# Patient Record
Sex: Female | Born: 1950 | Race: White
Health system: Southern US, Community
[De-identification: ages and names within clinical notes are randomized; demographics above are authoritative.]

## PROBLEM LIST (undated history)

## (undated) VITALS — BP 156/72 | HR 67 | Temp 98.2°F | Resp 16 | Ht 64.0 in | Wt 148.0 lb

## (undated) VITALS — BP 137/68 | HR 78 | Temp 98.0°F | Resp 16 | Wt 147.0 lb

## (undated) VITALS — BP 154/88 | HR 86 | Temp 97.0°F | Wt 150.0 lb

## (undated) VITALS — BP 137/72 | HR 75 | Temp 97.7°F | Resp 18

## (undated) DIAGNOSIS — J392 Other diseases of pharynx: Secondary | ICD-10-CM

## (undated) DIAGNOSIS — N183 Chronic kidney disease, stage 3 unspecified: Secondary | ICD-10-CM

## (undated) DIAGNOSIS — E232 Diabetes insipidus: Secondary | ICD-10-CM

## (undated) DIAGNOSIS — E119 Type 2 diabetes mellitus without complications: Secondary | ICD-10-CM

## (undated) DIAGNOSIS — K219 Gastro-esophageal reflux disease without esophagitis: Secondary | ICD-10-CM

## (undated) DIAGNOSIS — N289 Disorder of kidney and ureter, unspecified: Secondary | ICD-10-CM

## (undated) DIAGNOSIS — S92909A Unspecified fracture of unspecified foot, initial encounter for closed fracture: Secondary | ICD-10-CM

## (undated) DIAGNOSIS — K8681 Exocrine pancreatic insufficiency: Secondary | ICD-10-CM

## (undated) DIAGNOSIS — D689 Coagulation defect, unspecified: Secondary | ICD-10-CM

## (undated) DIAGNOSIS — F419 Anxiety disorder, unspecified: Secondary | ICD-10-CM

## (undated) DIAGNOSIS — H269 Unspecified cataract: Secondary | ICD-10-CM

## (undated) DIAGNOSIS — N189 Chronic kidney disease, unspecified: Secondary | ICD-10-CM

## (undated) DIAGNOSIS — T148XXA Other injury of unspecified body region, initial encounter: Secondary | ICD-10-CM

## (undated) DIAGNOSIS — K589 Irritable bowel syndrome without diarrhea: Secondary | ICD-10-CM

## (undated) DIAGNOSIS — I10 Essential (primary) hypertension: Secondary | ICD-10-CM

## (undated) DIAGNOSIS — H539 Unspecified visual disturbance: Secondary | ICD-10-CM

## (undated) DIAGNOSIS — S199XXA Unspecified injury of neck, initial encounter: Secondary | ICD-10-CM

## (undated) DIAGNOSIS — K8689 Other specified diseases of pancreas: Secondary | ICD-10-CM

## (undated) DIAGNOSIS — S93409A Sprain of unspecified ligament of unspecified ankle, initial encounter: Secondary | ICD-10-CM

## (undated) DIAGNOSIS — M719 Bursopathy, unspecified: Secondary | ICD-10-CM

## (undated) DIAGNOSIS — S6290XA Unspecified fracture of unspecified wrist and hand, initial encounter for closed fracture: Secondary | ICD-10-CM

## (undated) DIAGNOSIS — L409 Psoriasis, unspecified: Secondary | ICD-10-CM

## (undated) DIAGNOSIS — I071 Rheumatic tricuspid insufficiency: Secondary | ICD-10-CM

## (undated) DIAGNOSIS — S9306XA Dislocation of unspecified ankle joint, initial encounter: Secondary | ICD-10-CM

## (undated) DIAGNOSIS — E785 Hyperlipidemia, unspecified: Secondary | ICD-10-CM

## (undated) DIAGNOSIS — M545 Low back pain, unspecified: Secondary | ICD-10-CM

## (undated) DIAGNOSIS — I519 Heart disease, unspecified: Secondary | ICD-10-CM

## (undated) DIAGNOSIS — I499 Cardiac arrhythmia, unspecified: Secondary | ICD-10-CM

## (undated) DIAGNOSIS — S42309A Unspecified fracture of shaft of humerus, unspecified arm, initial encounter for closed fracture: Secondary | ICD-10-CM

## (undated) DIAGNOSIS — G4730 Sleep apnea, unspecified: Secondary | ICD-10-CM

## (undated) DIAGNOSIS — J349 Unspecified disorder of nose and nasal sinuses: Secondary | ICD-10-CM

## (undated) DIAGNOSIS — T7840XA Allergy, unspecified, initial encounter: Secondary | ICD-10-CM

## (undated) DIAGNOSIS — E079 Disorder of thyroid, unspecified: Secondary | ICD-10-CM

## (undated) DIAGNOSIS — G4700 Insomnia, unspecified: Secondary | ICD-10-CM

## (undated) DIAGNOSIS — I639 Cerebral infarction, unspecified: Secondary | ICD-10-CM

## (undated) DIAGNOSIS — M810 Age-related osteoporosis without current pathological fracture: Secondary | ICD-10-CM

## (undated) HISTORY — DX: Insomnia, unspecified: G47.00

## (undated) HISTORY — DX: Allergy, unspecified, initial encounter: T78.40XA

## (undated) HISTORY — DX: Disorder of thyroid, unspecified: E07.9

## (undated) HISTORY — PX: FACIAL FRACTURE SURGERY: SHX1570

## (undated) HISTORY — PX: TONSILLECTOMY: SUR1361

## (undated) HISTORY — DX: Anxiety disorder, unspecified: F41.9

## (undated) HISTORY — PX: ABDOMINAL HYSTERECTOMY: SHX81

## (undated) HISTORY — DX: Gastro-esophageal reflux disease without esophagitis: K21.9

## (undated) HISTORY — PX: CERVICAL SPINE SURGERY: SHX589

## (undated) HISTORY — DX: Age-related osteoporosis without current pathological fracture: M81.0

## (undated) HISTORY — DX: Cerebral infarction, unspecified: I63.9

## (undated) HISTORY — DX: Hyperlipidemia, unspecified: E78.5

## (undated) HISTORY — DX: Essential (primary) hypertension: I10

## (undated) HISTORY — DX: Unspecified fracture of unspecified foot, initial encounter for closed fracture: S92.909A

## (undated) HISTORY — DX: Heart disease, unspecified: I51.9

## (undated) HISTORY — DX: Other diseases of pharynx: J39.2

## (undated) HISTORY — DX: Low back pain, unspecified: M54.50

## (undated) HISTORY — PX: SKIN BIOPSY: SHX1

## (undated) HISTORY — DX: Unspecified injury of neck, initial encounter: S19.9XXA

## (undated) HISTORY — DX: Other injury of unspecified body region, initial encounter: T14.8XXA

## (undated) HISTORY — PX: ABDOMINAL SURGERY: SHX537

## (undated) HISTORY — DX: Sleep apnea, unspecified: G47.30

## (undated) HISTORY — DX: Dislocation of unspecified ankle joint, initial encounter: S93.06XA

## (undated) HISTORY — DX: Unspecified visual disturbance: H53.9

## (undated) HISTORY — DX: Disorder of kidney and ureter, unspecified: N28.9

## (undated) HISTORY — DX: Unspecified fracture of shaft of humerus, unspecified arm, initial encounter for closed fracture: S42.309A

## (undated) HISTORY — DX: Unspecified fracture of unspecified hand, initial encounter for closed fracture: S62.90XA

## (undated) HISTORY — PX: CYSTOSCOPY, BLADDER BIOPSY: SHX3554

## (undated) HISTORY — PX: EYE SURGERY: SHX253

## (undated) HISTORY — DX: Bursopathy, unspecified: M71.9

## (undated) HISTORY — PX: CATARACT EXTRACTION: SUR2

## (undated) HISTORY — DX: Other specified diseases of pancreas: K86.89

## (undated) HISTORY — DX: Sprain of unspecified ligament of unspecified ankle, initial encounter: S93.409A

## (undated) HISTORY — PX: BLADDER SUSPENSION: SHX72

## (undated) HISTORY — DX: Unspecified disorder of nose and nasal sinuses: J34.9

## (undated) HISTORY — DX: Chronic kidney disease, unspecified: N18.9

## (undated) HISTORY — DX: Coagulation defect, unspecified: D68.9

## (undated) HISTORY — PX: FRACTURE SURGERY: SHX138

## (undated) HISTORY — DX: Diabetes insipidus: E23.2

## (undated) HISTORY — DX: Type 2 diabetes mellitus without complications: E11.9

## (undated) HISTORY — PX: HUMERUS FRACTURE SURGERY: SHX670

## (undated) HISTORY — DX: Rheumatic tricuspid insufficiency: I07.1

## (undated) HISTORY — DX: Psoriasis, unspecified: L40.9

## (undated) HISTORY — DX: Irritable bowel syndrome, unspecified: K58.9

---

## 1981-10-03 HISTORY — PX: ABDOMINAL SURGERY: SHX537

## 1991-10-04 DIAGNOSIS — I639 Cerebral infarction, unspecified: Secondary | ICD-10-CM

## 1991-10-04 HISTORY — DX: Cerebral infarction, unspecified: I63.9

## 2001-10-03 DIAGNOSIS — M300 Polyarteritis nodosa: Secondary | ICD-10-CM

## 2001-10-03 DIAGNOSIS — Z5189 Encounter for other specified aftercare: Secondary | ICD-10-CM

## 2001-10-03 HISTORY — DX: Polyarteritis nodosa: M30.0

## 2001-10-03 HISTORY — PX: RENAL BIOPSY: SHX156

## 2001-10-03 HISTORY — DX: Encounter for other specified aftercare: Z51.89

## 2002-05-03 ENCOUNTER — Emergency Department: Admit: 2002-05-03 | Payer: Self-pay | Source: Emergency Department | Admitting: Emergency Medicine

## 2002-05-27 ENCOUNTER — Inpatient Hospital Stay
Admission: EM | Admit: 2002-05-27 | Disposition: A | Discharge status: Home / Self Care | Payer: Self-pay | Source: Emergency Department | Admitting: Internal Medicine

## 2002-06-03 DIAGNOSIS — D699 Hemorrhagic condition, unspecified: Secondary | ICD-10-CM

## 2002-06-03 HISTORY — DX: Hemorrhagic condition, unspecified: D69.9

## 2002-06-12 ENCOUNTER — Ambulatory Visit
Admit: 2002-06-12 | Disposition: A | Discharge status: Home / Self Care | Payer: Self-pay | Source: Ambulatory Visit | Admitting: Internal Medicine

## 2005-03-31 DIAGNOSIS — M300 Polyarteritis nodosa: Secondary | ICD-10-CM | POA: Insufficient documentation

## 2005-03-31 DIAGNOSIS — N183 Chronic kidney disease, stage 3 unspecified: Secondary | ICD-10-CM | POA: Insufficient documentation

## 2006-07-27 DIAGNOSIS — E119 Type 2 diabetes mellitus without complications: Secondary | ICD-10-CM | POA: Insufficient documentation

## 2006-11-10 DIAGNOSIS — N952 Postmenopausal atrophic vaginitis: Secondary | ICD-10-CM | POA: Insufficient documentation

## 2006-12-06 DIAGNOSIS — K219 Gastro-esophageal reflux disease without esophagitis: Secondary | ICD-10-CM | POA: Insufficient documentation

## 2006-12-18 DIAGNOSIS — J309 Allergic rhinitis, unspecified: Secondary | ICD-10-CM | POA: Insufficient documentation

## 2007-01-26 DIAGNOSIS — H524 Presbyopia: Secondary | ICD-10-CM | POA: Insufficient documentation

## 2008-05-26 DIAGNOSIS — L570 Actinic keratosis: Secondary | ICD-10-CM | POA: Insufficient documentation

## 2010-05-12 ENCOUNTER — Ambulatory Visit
Admit: 2010-05-12 | Disposition: A | Discharge status: Home / Self Care | Payer: Self-pay | Source: Ambulatory Visit | Admitting: Internal Medicine

## 2010-10-03 HISTORY — PX: COLONOSCOPY: SHX174

## 2010-10-03 HISTORY — PX: LIFT, ARM (MEDICAL): SHX4702

## 2010-10-03 HISTORY — PX: EXCISION BIOPSY WITH NEEDLE LOCALIZATION: SHX2709

## 2011-06-30 DIAGNOSIS — D696 Thrombocytopenia, unspecified: Secondary | ICD-10-CM | POA: Insufficient documentation

## 2011-10-19 DIAGNOSIS — M7500 Adhesive capsulitis of unspecified shoulder: Secondary | ICD-10-CM | POA: Insufficient documentation

## 2011-12-08 DIAGNOSIS — R519 Headache, unspecified: Secondary | ICD-10-CM | POA: Insufficient documentation

## 2012-09-24 NOTE — Procedures (Unsigned)
PATIENT NAME:  Katelyn Pique MD:   Francesco Runner, MD      DICTATING MD:  Noelle Penner, MD      PATIENT DOB:     01-14-1951      MED REC NO:      [id number]      EXAM DATE:       05/29/2002      ACCOUNT NO:     [id number]      PATIENT LOC:     [id number] 855 [id number]      TAPE:  [phone]            EXAMINATION:  M-MODE, 2-D, DOPPLER AND COLOR FLOW DOPPLER ECHOCARDIOGRAM.            CLINICAL INDICATION:  A 61 year old female with pneumonia.  Please evaluate      ejection fraction.            INTERPRETATION:  M-Mode Dimensions:  RV 2.7 cm.  Aortic root 3 cm.  Aortic      leaflets 1.9 cm.  Left atrium 3.8 cm.  LV diastole 4.8 cm.  LV systole 3.2      cm.  Septal thickness 1 cm.  Posterior wall thickness 1 cm.  Fractional      shortening 33%.  Ejection fraction 62%.            2-D Comments:  All four chambers are normal size, although the right      ventricle is top normal size.  RV systolic function is normal.  LV systolic      function is normal.  No regional wall motion abnormality is noted.  No LV      thrombus identified.  The aortic valve is trileaflet.  It excurts normally.      No abnormalities of the mitral, tricuspid or pulmonic valve seen.  No      intracavitary mass detected.  No abnormalities of the pericardial space      seen.            Doppler reveals no evidence of aortic insufficiency.  Trace physiologic      mitral regurgitation noted.  Trace to mild tricuspid regurgitation.  Peak      velocity 2.11 m/s.  Assuming a right atrial pressure of 5-10 mmHg, RV      systolic pressure is estimated to be 23-28 mmHg which is normal.  No      pulmonic insufficiency.  Velocity across the pulmonic valve normal at 0.77      m/s.  The E:A ratio was reversed.            FINAL IMPRESSION/ASSESSMENT      1.   Normal LV size, wall thickness and systolic function.  Estimated      ejection fraction 62%.  No regional wall motion abnormality is noted.  No      LV thrombus identified.  Diastolic dysfunction was noted.       2.   Trace to mild tricuspid regurgitation with top normal RV size, normal      RV systolic function and normal estimated RV systolic pressure of 23-28      mmHg.                        __________________________________      Noelle Penner, MD            [id number]  D: 05/29/2002  7:17 P      T: 05/30/2002  3:36 P      J: [id number]      N: [id number]      CC:   Noelle Penner, MD
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
## 2013-10-03 HISTORY — PX: CHOLECYSTECTOMY: SHX55

## 2014-04-01 DIAGNOSIS — H95819 Postprocedural stenosis of unspecified external ear canal: Secondary | ICD-10-CM | POA: Insufficient documentation

## 2014-04-01 DIAGNOSIS — I669 Occlusion and stenosis of unspecified cerebral artery: Secondary | ICD-10-CM | POA: Insufficient documentation

## 2014-05-13 DIAGNOSIS — N009 Acute nephritic syndrome with unspecified morphologic changes: Secondary | ICD-10-CM | POA: Insufficient documentation

## 2015-08-06 DIAGNOSIS — G43009 Migraine without aura, not intractable, without status migrainosus: Secondary | ICD-10-CM | POA: Insufficient documentation

## 2015-08-06 DIAGNOSIS — G43909 Migraine, unspecified, not intractable, without status migrainosus: Secondary | ICD-10-CM | POA: Insufficient documentation

## 2016-02-15 ENCOUNTER — Ambulatory Visit (INDEPENDENT_AMBULATORY_CARE_PROVIDER_SITE_OTHER): Payer: Medicare Other | Admitting: Family Medicine

## 2016-02-15 ENCOUNTER — Encounter (INDEPENDENT_AMBULATORY_CARE_PROVIDER_SITE_OTHER): Payer: Self-pay

## 2016-02-15 VITALS — BP 149/88 | HR 90 | Temp 97.7°F | Resp 16 | Ht 64.0 in | Wt 150.0 lb

## 2016-02-15 DIAGNOSIS — H60502 Unspecified acute noninfective otitis externa, left ear: Secondary | ICD-10-CM

## 2016-02-15 MED ORDER — TRAMADOL HCL 50 MG PO TABS
ORAL_TABLET | ORAL | Status: DC
Start: 2016-02-15 — End: 2016-04-25

## 2016-02-15 MED ORDER — CIPROFLOXACIN-HYDROCORTISONE 0.2-1 % OT SUSP
OTIC | Status: DC
Start: 2016-02-15 — End: 2016-04-25

## 2016-02-15 NOTE — Progress Notes (Signed)
[facility]  PROGRESS NOTE     Patient: Katelyn Caldwell   Date: 02/15/2016   MRN: [id number]       Katelyn Caldwell is a 65 y.o. female      SUBJECTIVE     Chief Complaint   Patient presents with   . Otalgia     c/o 6 out of 10 pain in left ear began 02/11/16, pt suspects an infection on the outer ear. Self treat with tylenol.          HPI    65/F presents with left ear pain for 4 days.  She hasn't been swimming or picking in her ears.  No ear drainage.  She feels like there is a cut inside.  She has not been ill.  She has some allergy symptoms as per her usual but nothing bad and she isn't taking allergy meds.      Review of Systems  No f/c  No n/v    The following portions of the patient's history were reviewed and updated as appropriate: Allergies, Current Medications, Past Family History, Past Medical history, Past social history, Past surgical history, and Problem List.    OBJECTIVE     Vitals   Filed Vitals:    02/15/16 1725   BP: 149/88   Pulse: 90   Temp: 97.7 F (36.5 C)   TempSrc: Tympanic   Resp: 16   Height: 1.626 m (5\' 4" )   Weight: 68.04 kg (150 lb)       Physical Exam  VS reviewed.  General:  Well-developed, well-nourished, not in acute respiratory distress  Head:  Normocephalic, atraumatic  Eyes:  PERLA, pink conjunctivae, anicteric sclerae, no discharge  Ears:  Right EAC and TM clear, left EAC inflamed (swollen and red) such that TM not visualized, left tragal tenderness  Nose:  External nose normal, mucosa nonerythematous and nonswollen, no discharge  Throat:  Tonsils nonenlarged and nonerythematous, no exudates, uvula normal  Neck:  Trachea midline, thyroid normal  Lungs:  Clear breath sounds, no rales, no wheezes, no rhonchi  Heart:  Rate normal, regular rhythm, no murmur appreciated  Lymphatics:  No cervical lymphadenopathy  Psych:  Normal mood, appropriate affect  Neurologic:  Alert, oriented to time/place/person  Skin:  Warm, dry, good turgor, no rash      Lab Results (24 Hour)   Results      ** No results found for the last 24 hours. **          Radiology Results (24 Hour)     ** No results found for the last 24 hours. **          ASSESSMENT     Encounter Diagnosis   Name Primary?   . Acute otitis externa of left ear, unspecified type Yes          PLAN     Procedures    1. Acute otitis externa of left ear, unspecified type  - ciprofloxacin-hydrocortisone (CIPRO HC) otic suspension; 3 drops to affected ear twice daily for a week  Dispense: 10 mL; Refill: 0  - traMADol (ULTRAM) 50 MG tablet; 1 PO q8h prn severe pain  Dispense: 10 tablet; Refill: 0  Discussed treatment and how to administer medicine  No swimming or submerging ears under water in the next week  F/u PCP 1 week PRN        An After Visit Summary was printed and given to the patient.  Signed,  Lambert Keto, MD  02/15/2016

## 2016-02-15 NOTE — Patient Instructions (Signed)
External Ear Infection (Adult)    External otitis (also called "swimmer's ear") is an infection in the ear canal. It is often caused by bacteria or fungus. It can occur a few days after water gets trapped in the ear canal (from swimming or bathing). It can also occur after cleaning too deeply in the ear canal with a cotton swab or other object. Sometimes, hair care products get into the ear canal and cause this problem.  Symptoms can include pain, fever, itching, redness, drainage, or swelling of the ear canal. Temporary hearing loss may also occur.  Home care   Do not try to clean the ear canal. This can push pus and bacteria deeper into the canal.   Use prescribed ear drops as directed. These help reduce swelling and fight the infection. If an ear wick was placed in the ear canal, apply drops right onto the end of the wick. The wick will draw the medication into the ear canal even if it is swollen closed.   A cotton ball may be loosely placed in the outer ear to absorb any drainage.   You may use acetaminophen or ibuprofen to control pain, unless another medication was prescribed. Note: If you have chronic liver or kidney disease or ever had a stomach ulcer or GI bleeding, talk to your health care provider before taking any of these medications.   Do not allow water to get into your ear when bathing. Also, avoid swimming until the infection has cleared.  Prevention   Keep your ears dry. This helps lower the risk of infection. Dry your ears with a towel or hair dryer after getting wet. Also, use ear plugs when swimming.   Do not stick any objects in the ear to remove wax.   If you feel water trapped in your ear, use ear drops right away. You can get these drops over the counter at most drugstores. They work by removing water from the ear canal.  Follow-up care  Follow up with your health care provider in one week, or as advised.  When to seek medical advice  Call your health care provider right away if  any of these occur:   Ear pain becomes worse or doesn't improve after 3 days of treatment   Redness or swelling of the outer ear occurs or gets worse   Headache   Painful or stiff neck   Drowsiness or confusion   Fever of 100.4F (38C) or higher, or as directed by your health care provider   Seizure  Date Last Reviewed: 12/22/2013   2000-2016 [employer] [address], [place] [address]. All rights reserved. This information is not intended as a substitute for professional medical care. Always follow your healthcare professional's instructions.

## 2016-04-09 ENCOUNTER — Ambulatory Visit (INDEPENDENT_AMBULATORY_CARE_PROVIDER_SITE_OTHER): Payer: Medicare Other | Admitting: Family

## 2016-04-09 ENCOUNTER — Encounter (INDEPENDENT_AMBULATORY_CARE_PROVIDER_SITE_OTHER): Payer: Self-pay

## 2016-04-09 VITALS — BP 145/72 | HR 78 | Temp 97.8°F | Resp 18 | Ht 64.0 in | Wt 157.4 lb

## 2016-04-09 DIAGNOSIS — J0140 Acute pansinusitis, unspecified: Secondary | ICD-10-CM

## 2016-04-09 DIAGNOSIS — S161XXA Strain of muscle, fascia and tendon at neck level, initial encounter: Secondary | ICD-10-CM

## 2016-04-09 MED ORDER — CYCLOBENZAPRINE HCL 5 MG PO TABS
5.0000 mg | ORAL_TABLET | Freq: Two times a day (BID) | ORAL | Status: DC | PRN
Start: 2016-04-09 — End: 2016-04-25

## 2016-04-09 MED ORDER — AMOXICILLIN-POT CLAVULANATE 875-125 MG PO TABS
1.0000 | ORAL_TABLET | Freq: Two times a day (BID) | ORAL | Status: AC
Start: 2016-04-09 — End: 2016-04-19

## 2016-04-09 NOTE — Patient Instructions (Signed)
Neck Sprain or Strain  A sudden force that causes turning or bending of the neck can cause sprain or strain. An example would be the force from a car accident. This can stretch or tear muscles called a strain. It can also stretch or tear ligaments called a sprain. Either of these can cause neck pain. Sometimes neck pain occurs after a simple awkward movement. In either case, muscle spasm is commonly present and contributes to the pain.    Unless you had a forceful physical injury (for example, a car accident or fall), X-rays are usually not ordered for the initial evaluation of neck pain. If pain continues and dose not respond to medical treatment, X-rays and other tests may be performed at a later time.  Home care   You may feel more soreness and spasm the first few days after the injury. Rest until symptoms begin to improve.   When lying down, use a comfortable pillow or a rolled towel that supports the head and keeps the spine in a neutral position. The position of the head should not be tilted forward or backward.   Apply an ice pack over the injured area for 15 to 20 minutes every 3 to 6 hours. You should do this for the first 24 to 48 hours. You can make an ice pack by filling a plastic bag that seals at the top with ice cubes and then wrapping it with a thin towel. After 48 hours, apply heat (warm shower or warm bath) for 15 to 20 minutes several times a day, or alternate ice and heat.   You may use over-the-counter pain medicine to control pain, unless another pain medicine was prescribed. If you have chronic liver or kidney disease or ever had a stomach ulcer or GI bleeding, talk with your healthcare provider before using these medicines.   If a soft cervical collar was prescribed, it should be worn only for periods of increased pain. It should not be worn for more than 3 hours a day, or for a period longer than 1 to 2 weeks.  Follow-up care  Follow up with your healthcare provider as directed.  Physical therapy may be needed.  Sometimes fractures don't show up on the first X-ray. Bruises and sprains can sometimes hurt as much as a fracture. These injuries can take time to heal completely. If your symptoms don't improve or they get worse, talk with your healthcare provider. You may need a repeat X-ray or other tests. If X-rays were taken, you will be told of any new findings that may affect your care.  Call 911  Call 911 if you have:   Neck swelling, difficulty or painful swallowing   Difficulty breathing   Chest pain  When to seek medical advice  Call your healthcare provider right away if any of these occur:   Pain becomes worse or spreads into your arms   Weakness or numbness in one or both arms  Date Last Reviewed: 08/21/2014   2000-2016 [employer] [address], [place], [place] [address]. All rights reserved. This information is not intended as a substitute for professional medical care. Always follow your healthcare professional's instructions.          Sinusitis (Antibiotic Treatment)    The sinuses are air-filled spaces within the bones of the face. They connect to the inside of the nose.Sinusitisis an inflammation of the tissue lining the sinus cavity. Sinus inflammation can occur during a cold. It can also  be due to allergies to pollens and other particles in the air. Sinusitis can cause symptoms of sinus congestion and fullness. A sinus infection causes fever, headache and facial pain. There is often green or yellow drainage from the nose or into the back of the throat (post-nasal drip). You have been given antibiotics to treat this condition.  Home care:   Take the full course of antibiotics as instructed. Do not stop taking them, even if you feel better.   Drink plenty of water, hot tea, and other liquids. This may help thin mucus. It also may promote sinus drainage.   Heat may help soothe painful areas of the face. Use a towel soaked in hot water. Or, stand in the  shower and direct the hot spray onto your face. Using a vaporizer along with a menthol rub at night may also help.   Anexpectorantcontaining guaifenesin may help thin the mucus and promote drainage from the sinuses.   Over-the-counterdecongestantsmay be used unless a similar medicine was prescribed. Nasal sprays work the fastest. Use one that contains phenylephrine or oxymetazoline. First blow the nose gently. Then use the spray. Do not use these medicines more often than directed on the label or symptoms may get worse. You may also use tablets containing pseudoephedrine. Avoid products that combine ingredients, because side effects may be increased. Read labels. You can also ask the pharmacist for help. (NOTE:Persons with high blood pressure should not use decongestants. They can raise blood pressure.)   Over-the-counterantihistaminesmay help if allergies contributed to your sinusitis.    Do not use nasal rinses or irrigation during an acute sinus infection, unless told to by your health care provider. Rinsing may spread the infection to other sinuses.   Use acetaminophen or ibuprofen to control pain, unless another pain medicine was prescribed. (If you have chronic liver or kidney disease or ever had a stomach ulcer, talk with your doctor before using these medicines. Aspirin should never be used in anyone under 30 years of age who is ill with a fever. It may cause severe liver damage.)   Don't smoke. This can worsen symptoms.  Follow-up care  Follow up with your healthcare provider or our staff if you are not improving within the next week.  When to seek medical advice  Call your healthcare provider if any of these occur:   Facial pain or headache becoming more severe   Stiff neck   Unusual drowsiness or confusion   Swelling of the forehead or eyelids   Vision problems, including blurred or double vision   Fever of100.11F (38C)or higher, or as directed by your healthcare  provider   Seizure   Breathing problems   Symptoms not resolving within 10 days  Date Last Reviewed: 01/13/2014   2000-2016 [employer] [address], [place], [place] [address]. All rights reserved. This information is not intended as a substitute for professional medical care. Always follow your healthcare professional's instructions.

## 2016-04-09 NOTE — Progress Notes (Signed)
URGENT  CARE  PROGRESS NOTE     Patient: Katelyn Caldwell   Date: 04/10/2016   MRN: [id number]       Katelyn Caldwell is a 65 y.o. female      SUBJECTIVE     Chief Complaint   Patient presents with   . Sinus Problem     Pt c/o servere headaches, sinus pressure, and post nasal drip x 40month         HPI   Chief Complaint   Patient presents with   . Sinus Problem     Pt c/o servere headaches, sinus pressure, and post nasal drip x 40month     Pt also complains of neck "tension" and spasm.        Review of Systems   Constitutional: Positive for fever and fatigue.   HENT: Positive for congestion, ear pain, postnasal drip and sinus pressure.    Respiratory: Positive for cough. Negative for wheezing.    Cardiovascular: Negative for chest pain.   Gastrointestinal: Negative for nausea, vomiting and diarrhea.   Musculoskeletal: Positive for neck pain.   Neurological: Positive for headaches.       The following portions of the patient's history were reviewed and updated as appropriate: Allergies, Current Medications, Past Family History, Past Medical history, Past social history, Past surgical history, and Problem List.    OBJECTIVE     Vitals   Filed Vitals:    04/09/16 1616   BP: 145/72   Pulse: 78   Temp: 97.8 F (36.6 C)   TempSrc: Oral   Resp: 18   Height: 1.626 m (5\' 4" )   Weight: 71.396 kg (157 lb 6.4 oz)   SpO2: 98%       Physical Exam   Vitals reviewed.  Constitutional: She is oriented to person, place, and time. No distress.   HENT:   Right Ear: Tympanic membrane is bulging.   Left Ear: Tympanic membrane is bulging.   Nose: Mucosal edema present. Right sinus exhibits maxillary sinus tenderness and frontal sinus tenderness. Left sinus exhibits maxillary sinus tenderness and frontal sinus tenderness.   Mouth/Throat: Oropharyngeal exudate present.   Eyes: Conjunctivae are normal. Pupils are equal, round, and reactive to light.   Neck: Normal range of motion.   Cardiovascular: Normal rate and normal heart sounds.     Pulmonary/Chest: Effort normal and breath sounds normal.   Musculoskeletal: Normal range of motion.   Lymphadenopathy:        Head (right side): Submental and submandibular adenopathy present.        Head (left side): Submandibular adenopathy present.     She has cervical adenopathy.   Neurological: She is alert and oriented to person, place, and time.   Skin: Skin is warm.       Lab Results (24 Hour)   Results     ** No results found for the last 24 hours. **          Radiology Results (24 Hour)     ** No results found for the last 24 hours. **          ASSESSMENT     Encounter Diagnoses   Name Primary?   . Acute pansinusitis, recurrence not specified Yes   . Acute strain of neck muscle, initial encounter           PLAN     Procedures    1. Acute pansinusitis, recurrence not specified  - amoxicillin-clavulanate (AUGMENTIN) 875-125 MG  per tablet; Take 1 tablet by mouth 2 (two) times daily.  Dispense: 20 tablet; Refill: 0    2. Acute strain of neck muscle, initial encounter  - cyclobenzaprine (FLEXERIL) 5 MG tablet; Take 1 tablet (5 mg total) by mouth 2 (two) times daily as needed for Muscle spasms.  Dispense: 15 tablet; Refill: 0    An After Visit Summary was printed and given to the patient.      Signed,  Wayne Both, NP  04/10/2016

## 2016-04-18 DIAGNOSIS — G4486 Cervicogenic headache: Secondary | ICD-10-CM | POA: Insufficient documentation

## 2016-04-25 ENCOUNTER — Encounter (INDEPENDENT_AMBULATORY_CARE_PROVIDER_SITE_OTHER): Payer: Self-pay | Admitting: Family Medicine

## 2016-04-25 ENCOUNTER — Ambulatory Visit (INDEPENDENT_AMBULATORY_CARE_PROVIDER_SITE_OTHER): Payer: Medicare Other | Admitting: Family Medicine

## 2016-04-25 VITALS — BP 124/77 | HR 85 | Temp 98.4°F | Ht 62.99 in | Wt 155.0 lb

## 2016-04-25 DIAGNOSIS — K219 Gastro-esophageal reflux disease without esophagitis: Secondary | ICD-10-CM

## 2016-04-25 DIAGNOSIS — M300 Polyarteritis nodosa: Secondary | ICD-10-CM

## 2016-04-25 DIAGNOSIS — Z86010 Personal history of colonic polyps: Secondary | ICD-10-CM | POA: Insufficient documentation

## 2016-04-25 DIAGNOSIS — Z1239 Encounter for other screening for malignant neoplasm of breast: Secondary | ICD-10-CM

## 2016-04-25 DIAGNOSIS — D696 Thrombocytopenia, unspecified: Secondary | ICD-10-CM

## 2016-04-25 DIAGNOSIS — N183 Chronic kidney disease, stage 3 unspecified: Secondary | ICD-10-CM

## 2016-04-25 DIAGNOSIS — E538 Deficiency of other specified B group vitamins: Secondary | ICD-10-CM

## 2016-04-25 DIAGNOSIS — E7800 Pure hypercholesterolemia, unspecified: Secondary | ICD-10-CM

## 2016-04-25 DIAGNOSIS — E119 Type 2 diabetes mellitus without complications: Secondary | ICD-10-CM

## 2016-04-25 DIAGNOSIS — Z8639 Personal history of other endocrine, nutritional and metabolic disease: Secondary | ICD-10-CM

## 2016-04-25 DIAGNOSIS — J3089 Other allergic rhinitis: Secondary | ICD-10-CM

## 2016-04-25 DIAGNOSIS — H524 Presbyopia: Secondary | ICD-10-CM

## 2016-04-25 DIAGNOSIS — G4733 Obstructive sleep apnea (adult) (pediatric): Secondary | ICD-10-CM

## 2016-04-25 DIAGNOSIS — Z1211 Encounter for screening for malignant neoplasm of colon: Secondary | ICD-10-CM

## 2016-04-25 DIAGNOSIS — M7502 Adhesive capsulitis of left shoulder: Secondary | ICD-10-CM

## 2016-04-25 DIAGNOSIS — I10 Essential (primary) hypertension: Secondary | ICD-10-CM | POA: Insufficient documentation

## 2016-04-25 DIAGNOSIS — N952 Postmenopausal atrophic vaginitis: Secondary | ICD-10-CM

## 2016-04-25 LAB — COMPREHENSIVE METABOLIC PANEL
ALT: 8 U/L (ref 0–55)
AST (SGOT): 16 U/L (ref 5–34)
Albumin/Globulin Ratio: 1.3 (ref 0.9–2.2)
Albumin: 4.1 g/dL (ref 3.5–5.0)
Alkaline Phosphatase: 83 U/L (ref 37–106)
BUN: 23 mg/dL — ABNORMAL HIGH (ref 7.0–19.0)
Bilirubin, Total: 0.4 mg/dL (ref 0.1–1.2)
CO2: 27 mEq/L (ref 21–30)
Calcium: 10.8 mg/dL — ABNORMAL HIGH (ref 8.5–10.5)
Chloride: 103 mEq/L (ref 100–111)
Creatinine: 1.1 mg/dL (ref 0.4–1.5)
Globulin: 3.1 g/dL (ref 2.0–3.7)
Glucose: 96 mg/dL (ref 70–100)
Potassium: 5.7 mEq/L — ABNORMAL HIGH (ref 3.5–5.1)
Protein, Total: 7.2 g/dL (ref 6.0–8.3)
Sodium: 138 mEq/L (ref 135–146)

## 2016-04-25 LAB — MICROALBUMIN, RANDOM URINE
Urine Creatinine, Random: 22 mg/dL
Urine Microalbumin, Random: 287 — ABNORMAL HIGH (ref 0.0–30.0)
Urine Microalbumin/Creatinine Ratio: 1305 ug/mg — ABNORMAL HIGH (ref 0–30)

## 2016-04-25 LAB — GFR: EGFR: 49.8

## 2016-04-25 LAB — HEMOGLOBIN A1C
Average Estimated Glucose: 148.5 mg/dL
Hemoglobin A1C: 6.8 % — ABNORMAL HIGH (ref 4.6–5.9)

## 2016-04-25 LAB — HEMOLYSIS INDEX: Hemolysis Index: 10 (ref 0–18)

## 2016-04-25 MED ORDER — METFORMIN HCL 500 MG PO TABS
500.0000 mg | ORAL_TABLET | Freq: Every evening | ORAL | Status: DC
Start: 2016-04-25 — End: 2016-04-25

## 2016-04-25 MED ORDER — ATORVASTATIN CALCIUM 40 MG PO TABS
40.0000 mg | ORAL_TABLET | Freq: Every day | ORAL | Status: DC
Start: 2016-04-25 — End: 2016-04-25

## 2016-04-25 MED ORDER — SITAGLIPTIN PHOS-METFORMIN HCL 50-500 MG PO TABS
1.0000 | ORAL_TABLET | Freq: Two times a day (BID) | ORAL | Status: DC
Start: 2016-04-25 — End: 2016-04-25

## 2016-04-25 MED ORDER — LOSARTAN POTASSIUM 25 MG PO TABS
25.0000 mg | ORAL_TABLET | Freq: Every day | ORAL | Status: DC
Start: 2016-04-25 — End: 2016-08-30

## 2016-04-25 MED ORDER — ESTRADIOL 10 MCG VA TABS
ORAL_TABLET | VAGINAL | Status: DC
Start: 2016-04-25 — End: 2016-04-25

## 2016-04-25 MED ORDER — FLUTICASONE PROPIONATE 50 MCG/ACT NA SUSP
2.0000 | Freq: Every day | NASAL | Status: DC
Start: 2016-04-25 — End: 2016-04-25

## 2016-04-25 MED ORDER — CALCIUM CARBONATE ANTACID 500 MG PO CHEW
1.0000 | CHEWABLE_TABLET | Freq: Two times a day (BID) | ORAL | Status: DC
Start: 2016-04-25 — End: 2016-04-25

## 2016-04-25 MED ORDER — SITAGLIPTIN PHOS-METFORMIN HCL 50-500 MG PO TABS
1.0000 | ORAL_TABLET | Freq: Two times a day (BID) | ORAL | Status: DC
Start: 2016-04-25 — End: 2016-09-21

## 2016-04-25 MED ORDER — METFORMIN HCL 500 MG PO TABS
500.0000 mg | ORAL_TABLET | Freq: Every evening | ORAL | Status: DC
Start: 2016-04-25 — End: 2016-08-08

## 2016-04-25 MED ORDER — FLUTICASONE PROPIONATE 50 MCG/ACT NA SUSP
2.0000 | Freq: Every day | NASAL | Status: DC
Start: 2016-04-25 — End: 2016-07-27

## 2016-04-25 MED ORDER — ATORVASTATIN CALCIUM 40 MG PO TABS
40.0000 mg | ORAL_TABLET | Freq: Every day | ORAL | Status: DC
Start: 2016-04-25 — End: 2016-10-20

## 2016-04-25 MED ORDER — ESTRADIOL 10 MCG VA TABS
ORAL_TABLET | VAGINAL | Status: DC
Start: 2016-04-25 — End: 2016-10-01

## 2016-04-25 MED ORDER — CALCIUM CARBONATE ANTACID 500 MG PO CHEW
1.0000 | CHEWABLE_TABLET | Freq: Two times a day (BID) | ORAL | Status: DC
Start: 2016-04-25 — End: 2016-07-27

## 2016-04-25 NOTE — Progress Notes (Signed)
Have you seen any specialists/other providers since your last visit with us? New Patient.          Limb alert protocol reviewed? Yes.   No restriction.

## 2016-04-25 NOTE — Progress Notes (Signed)
Chief Complaint   Patient presents with   . Establish Care   . Diabetes         S:  First time visit for this patient here to discuss health issues:     Problem   Hx of Colonic Polyps    CF in 2009 with polyps and now due for repeat.     Post-Menopausal Atrophic Vaginitis    estridiol twice weekly works well for her.     History of Vitamin D Deficiency    1000 IU twice daily     Hypercholesterolemia   Essential Hypertension   Low Vitamin B12 Level    Takes 500 mcg once daily     Macular Puckering of Retina   Adhesive Capsulitis of Shoulder    Left shoulder with some limited ROM.     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     Allergic Rhinitis    Not using medications.     Esophageal Reflux    Pt uses protonix 40 mg daily.  Coffee 2 cups daily.  Not a smoker.  Red wine 2 times weekly.     Postmenopausal Atrophic Vaginitis    Desires to start vaginal estrogen cream.     Type 2 Diabetes Mellitus Without Complications    Hx gestational diabetes.  Last eye exam about 2015.  On cozaar.  On statin.  Avoids ASA due to PAN.  Last HgBA1c in 05/2015     Chronic Kidney Disease, Stage III (Moderate)    relatively stable CKD stage 3 w/ sCr ranging from 1.0 to 1.3 since 2003.  eGFR= 48.  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         Polyarteritis Nodosa    Dx in 2003.  S/p chemotx.  Stopped celcept in 2008.  CKD level 3 is due to the PAN.         Acute Glomerulonephritis With Unspecified Pathological Lesion in Kidney (Resolved)   Actinic Keratosis (Resolved)       Allergies   Allergen Reactions   . Nsaids        Current Outpatient Prescriptions   Medication Sig Dispense Refill   . atorvastatin (LIPITOR) 40 MG tablet Take 1 tablet (40 mg total) by mouth daily. 90 tablet 0   . calcium carbonate (TUMS) 500 MG chewable tablet Chew 1 tablet (500 mg total) by mouth 2 (two) times daily. 180 tablet 0   . losartan (COZAAR) 25 MG tablet Take 1  tablet (25 mg total) by mouth daily. 90 tablet 0   . metFORMIN (GLUCOPHAGE) 500 MG tablet Take 1 tablet (500 mg total) by mouth nightly. 90 tablet 0   . SITagliptin-metFORMIN (JANUMET) 50-500 MG per tablet Take 1 tablet by mouth 2 (two) times daily with meals. 180 tablet 0   . Estradiol 10 MCG Tab Place intravaginally twice weekly 24 tablet 0   . fluticasone (FLONASE) 50 MCG/ACT nasal spray 2 sprays by Nasal route daily. 16 g 5     No current facility-administered medications for this visit.       Review of Systems   Constitutional: Negative for fever, weight loss and malaise/fatigue.   HENT: Negative for sore throat.    Eyes: Negative for blurred vision.   Respiratory: Negative for cough and shortness of breath.    Cardiovascular: Negative for chest pain and palpitations.  Gastrointestinal: Negative for nausea and vomiting.   Genitourinary: Negative for dysuria.   Musculoskeletal: Positive for joint pain. Negative for myalgias.   Skin: Negative for rash.   Neurological: Negative for sensory change and headaches.   Endo/Heme/Allergies: Negative for polydipsia.   All other systems reviewed and are negative.        All other systems were reviewed and are negative    O:  BP 124/77 mmHg  Pulse 85  Temp(Src) 98.4 F (36.9 C)  Ht 1.6 m (5' 2.99")  Wt 70.308 kg (155 lb)  BMI 27.46 kg/m2  LMP  (Approximate), Body mass index is 27.46 kg/(m^2).  Vitals reviewed  GEN:  NAD, nonobese  PSYCH:  Orientation, Insight/Judgement, Memory, Mood/Affect without deffecit  EYES:  Normal bulbar conjunctiva and eyelids, pupils equal and round b/l, EOMi  ENT:  Normal lips  NEURO:  CN2-12 without focal defecit, normal sensation in hands  PULM:  cta b/l, normal effort  CVS:  rrr -m/r/g  SKIN:  Warm, dry, no visible lesions nor rashes  MS:  Normal digits/nails,  nml gait and station    A/P:     1. Polyarteritis nodosa  Comprehensive metabolic panel   2. Thrombocytopenia  Comprehensive metabolic panel   3. Presbyopia  Comprehensive  metabolic panel   4. Gastroesophageal reflux disease without esophagitis  Comprehensive metabolic panel   5. Hx of colonic polyps  Ambulatory referral to Gastroenterology    Comprehensive metabolic panel   6. Colon cancer screening  Ambulatory referral to Gastroenterology    Comprehensive metabolic panel   7. Chronic kidney disease, stage III (moderate)  Comprehensive metabolic panel   8. Seasonal allergic rhinitis due to other allergic trigger  Comprehensive metabolic panel    fluticasone (FLONASE) 50 MCG/ACT nasal spray    DISCONTINUED: fluticasone (FLONASE) 50 MCG/ACT nasal spray   9. OSA on CPAP  Comprehensive metabolic panel   10. Adhesive capsulitis of left shoulder  Comprehensive metabolic panel   11. Type 2 diabetes mellitus without complication, without long-term current use of insulin  Hemoglobin A1C    Microalbumin, Random Urine    Comprehensive metabolic panel    SITagliptin-metFORMIN (JANUMET) 50-500 MG per tablet    metFORMIN (GLUCOPHAGE) 500 MG tablet    atorvastatin (LIPITOR) 40 MG tablet    DISCONTINUED: SITagliptin-metFORMIN (JANUMET) 50-500 MG per tablet    DISCONTINUED: metFORMIN (GLUCOPHAGE) 500 MG tablet    DISCONTINUED: atorvastatin (LIPITOR) 40 MG tablet   12. Post-menopausal atrophic vaginitis  Comprehensive metabolic panel    Estradiol 10 MCG Tab    DISCONTINUED: Estradiol 10 MCG Tab   13. Essential hypertension  losartan (COZAAR) 25 MG tablet    Comprehensive metabolic panel   14. Hypercholesterolemia  Comprehensive metabolic panel    atorvastatin (LIPITOR) 40 MG tablet    DISCONTINUED: atorvastatin (LIPITOR) 40 MG tablet   15. History of vitamin D deficiency  Comprehensive metabolic panel   16. Low vitamin B12 level  Comprehensive metabolic panel   17. Breast cancer screening  Mammo Digital Screening Bilateral W Cad       Risk & Benefits of the new medication(s) were explained to the patient (and family) who verbalized understanding & agreed to the treatment plan. Patient (family)  encouraged to contact me/clinical staff with any questions/concerns      Raj Janus, MD

## 2016-04-27 ENCOUNTER — Telehealth (INDEPENDENT_AMBULATORY_CARE_PROVIDER_SITE_OTHER): Payer: Self-pay | Admitting: Family Medicine

## 2016-04-27 NOTE — Telephone Encounter (Signed)
Dr.V  [facility] called said patient was on EX for Metformin and Janument.   However the new Rx that was sent to them was not.  They wanted to know if patient should not be on the ER.  Please advice.  Thanks.  selam.

## 2016-04-28 NOTE — Telephone Encounter (Signed)
Please contact this pharmacy and stipulate the patient should be continued on the ER medication.

## 2016-04-28 NOTE — Telephone Encounter (Signed)
Pharmacist informed. 

## 2016-04-29 ENCOUNTER — Encounter (INDEPENDENT_AMBULATORY_CARE_PROVIDER_SITE_OTHER): Payer: Self-pay

## 2016-05-04 ENCOUNTER — Encounter (INDEPENDENT_AMBULATORY_CARE_PROVIDER_SITE_OTHER): Payer: Self-pay | Admitting: Family Medicine

## 2016-05-24 ENCOUNTER — Telehealth (INDEPENDENT_AMBULATORY_CARE_PROVIDER_SITE_OTHER): Payer: Self-pay | Admitting: Family Medicine

## 2016-05-24 NOTE — Telephone Encounter (Signed)
Patient left message stating she will be coming for a lab draw, and questioned if she could just come in or does she need to schedule appointment. Per chart there are no future labs, please advise.

## 2016-05-24 NOTE — Telephone Encounter (Signed)
Schedule an appointment.

## 2016-05-26 NOTE — Telephone Encounter (Signed)
Message left on patient voicemail. Instructed to call office with any questions or concerns.

## 2016-05-27 ENCOUNTER — Other Ambulatory Visit (FREE_STANDING_LABORATORY_FACILITY): Payer: Medicare Other

## 2016-05-28 LAB — CALCIUM, IONIZED: Calcium, Ionized: 2.62 mEq/L — ABNORMAL HIGH (ref 2.30–2.58)

## 2016-05-28 NOTE — Progress Notes (Signed)
Quick Note:    Please call patient to notify them that I would like to discuss their abnormal laboratory results at a follow-up appointment.  ______

## 2016-05-31 ENCOUNTER — Encounter (INDEPENDENT_AMBULATORY_CARE_PROVIDER_SITE_OTHER): Payer: Self-pay

## 2016-06-10 NOTE — Progress Notes (Signed)
Quick Note:    Please call pt to inform them that her mammogram was normal. They may follow-up as discussed, sooner with any problems or concerns.  ______

## 2016-07-27 ENCOUNTER — Ambulatory Visit (INDEPENDENT_AMBULATORY_CARE_PROVIDER_SITE_OTHER): Payer: Medicare Other | Admitting: Family Medicine

## 2016-07-27 ENCOUNTER — Encounter (INDEPENDENT_AMBULATORY_CARE_PROVIDER_SITE_OTHER): Payer: Self-pay | Admitting: Family Medicine

## 2016-07-27 ENCOUNTER — Telehealth (INDEPENDENT_AMBULATORY_CARE_PROVIDER_SITE_OTHER): Payer: Self-pay | Admitting: Family Medicine

## 2016-07-27 DIAGNOSIS — M7062 Trochanteric bursitis, left hip: Secondary | ICD-10-CM | POA: Insufficient documentation

## 2016-07-27 DIAGNOSIS — M7061 Trochanteric bursitis, right hip: Secondary | ICD-10-CM

## 2016-07-27 MED ORDER — PANTOPRAZOLE SODIUM 40 MG PO TBEC
40.0000 mg | DELAYED_RELEASE_TABLET | Freq: Every day | ORAL | 1 refills | Status: DC
Start: 2016-07-27 — End: 2016-10-25

## 2016-07-27 MED ORDER — GLUCOSE BLOOD VI STRP
ORAL_STRIP | 3 refills | Status: DC
Start: 2016-07-27 — End: 2017-07-26

## 2016-07-27 MED ORDER — FLUTICASONE PROPIONATE 50 MCG/ACT NA SUSP
2.0000 | Freq: Every day | NASAL | 5 refills | Status: DC
Start: 2016-07-27 — End: 2017-07-26

## 2016-07-27 MED ORDER — FREESTYLE LANCETS MISC
3 refills | Status: DC
Start: 2016-07-27 — End: 2017-12-27

## 2016-07-27 MED ORDER — CALCIUM CARBONATE ANTACID 500 MG PO CHEW
1.0000 | CHEWABLE_TABLET | Freq: Every day | ORAL | 0 refills | Status: DC
Start: 2016-07-27 — End: 2017-04-07

## 2016-07-27 MED ORDER — LIDOCAINE 5 % EX PTCH
1.0000 | MEDICATED_PATCH | Freq: Two times a day (BID) | CUTANEOUS | 2 refills | Status: DC
Start: 2016-07-27 — End: 2016-07-28

## 2016-07-27 NOTE — Telephone Encounter (Signed)
Per pharmacy first sentence in rx instructions needs to be removed because it contradicts the 2nd sentence.

## 2016-07-27 NOTE — Progress Notes (Signed)
Chief Complaint   Patient presents with   . Joint Swelling     right knee, hip, thigh, 1 month ago         S:      Problem   Trochanteric Bursitis of Right Hip    Pt notes recurrent issue of pain over right hip.  She has tried warm compresses without relief.  Notes worse with lawn work.  No fevers, rashes, tic bites.  Recently with some decreased renal function and must avoid NSAID tx.  No limitations in ROM.         Allergies   Allergen Reactions   . Nsaids        Current Outpatient Prescriptions   Medication Sig Dispense Refill   . atorvastatin (LIPITOR) 40 MG tablet Take 1 tablet (40 mg total) by mouth daily. 90 tablet 0   . calcium carbonate (TUMS) 500 MG chewable tablet Chew 1 tablet (500 mg total) by mouth daily. 180 tablet 0   . Estradiol 10 MCG Tab Place intravaginally twice weekly 24 tablet 0   . fluticasone (FLONASE) 50 MCG/ACT nasal spray 2 sprays by Nasal route daily. 16 g 5   . glucose blood (FREESTYLE LITE) test strip Pt uses freestyle freedom lyte test strips testing BID prn 300 each 3   . Lancets (FREESTYLE) lancets Use twice daily as needed 300 each 3   . lidocaine (LIDODERM) 5 % Place 1 patch onto the skin 2 (two) times daily.Remove & Discard patch within 12 hours or as directed by MD 30 patch 2   . losartan (COZAAR) 25 MG tablet Take 1 tablet (25 mg total) by mouth daily. 90 tablet 0   . metFORMIN (GLUCOPHAGE) 500 MG tablet Take 1 tablet (500 mg total) by mouth nightly. 90 tablet 0   . pantoprazole (PROTONIX) 40 MG tablet Take 1 tablet (40 mg total) by mouth daily. 90 tablet 1   . SITagliptin-metFORMIN (JANUMET) 50-500 MG per tablet Take 1 tablet by mouth 2 (two) times daily with meals. 180 tablet 0     No current facility-administered medications for this visit.        Review of Systems   Constitutional: Negative for fever and weight loss.   HENT: Negative for sore throat.    Eyes: Negative for blurred vision.   Respiratory: Negative for cough and shortness of breath.    Cardiovascular: Negative  for chest pain and palpitations.   Gastrointestinal: Positive for heartburn. Negative for nausea and vomiting.   Musculoskeletal: Positive for back pain and joint pain. Negative for myalgias.   Skin: Negative for rash.   Neurological: Negative for headaches.       All other systems were reviewed and are negative    O:  BP 144/82 (BP Site: Left arm, Patient Position: Sitting)   Pulse 83   Temp 99.5 F (37.5 C)   Wt 71.1 kg (156 lb 12.8 oz)   BMI 27.78 kg/m , Body mass index is 27.78 kg/m.  Vital signs reviewed  GEN:  NAD, nonobese  NEURO:  CN2-12 without focal defecit, nml gait and station  CVS:  rrr -m/r/g  ABD:  Soft, NT, ND, +BS  BACK:  B/l lumbar back paravertebral muscle tendernes, no rash, no bony abnormality  SKIN:  Warm, dry  EXT:  No edema, +2 radial pulse b/l, right greater trochanter overlying tissue with moderate tenderness    A/P:     1. Trochanteric bursitis of right hip  Topical lidoderm patches  Ice, rest         Risk & Benefits of the new medication(s) were explained to the patient (and family) who verbalized understanding & agreed to the treatment plan. Patient (family) encouraged to contact me/clinical staff with any questions/concerns    Raj Janus, MD

## 2016-07-27 NOTE — Progress Notes (Signed)
Nursing Documentation:  Limb alert status: Patient asked and denied any limb restrictions for blood pressure/blood draws.  Has the patient seen any other providers since their last visit: no  The patient is due for [facility] LETTER

## 2016-07-28 MED ORDER — LIDOCAINE 5 % EX PTCH
MEDICATED_PATCH | CUTANEOUS | 2 refills | Status: DC
Start: 2016-07-28 — End: 2017-10-22

## 2016-07-28 NOTE — Addendum Note (Signed)
Addended by: Raj Janus on: 07/28/2016 01:58 PM     Modules accepted: Orders

## 2016-08-08 ENCOUNTER — Encounter (INDEPENDENT_AMBULATORY_CARE_PROVIDER_SITE_OTHER): Payer: Self-pay | Admitting: Family Medicine

## 2016-08-08 DIAGNOSIS — E119 Type 2 diabetes mellitus without complications: Secondary | ICD-10-CM

## 2016-08-08 MED ORDER — METFORMIN HCL 500 MG PO TABS
500.0000 mg | ORAL_TABLET | Freq: Every evening | ORAL | 0 refills | Status: DC
Start: 2016-08-08 — End: 2016-08-20

## 2016-08-17 DIAGNOSIS — R002 Palpitations: Secondary | ICD-10-CM

## 2016-08-17 HISTORY — DX: Palpitations: R00.2

## 2016-08-19 ENCOUNTER — Encounter (INDEPENDENT_AMBULATORY_CARE_PROVIDER_SITE_OTHER): Payer: Self-pay | Admitting: Family Medicine

## 2016-08-19 ENCOUNTER — Ambulatory Visit (INDEPENDENT_AMBULATORY_CARE_PROVIDER_SITE_OTHER): Payer: Medicare Other | Admitting: Family Medicine

## 2016-08-19 VITALS — BP 146/87 | HR 80 | Temp 97.8°F | Wt 156.0 lb

## 2016-08-19 DIAGNOSIS — E119 Type 2 diabetes mellitus without complications: Secondary | ICD-10-CM

## 2016-08-19 DIAGNOSIS — R002 Palpitations: Secondary | ICD-10-CM

## 2016-08-19 LAB — HEMOGLOBIN A1C
A1c: 6.5
A1c: 7

## 2016-08-19 NOTE — Progress Notes (Signed)
A1C obtained from scanned labs in pt chart.

## 2016-08-19 NOTE — Progress Notes (Signed)
Chief Complaint   Patient presents with   . Other     hospital f/u 08/17/16 dx w/ palpitations          S:    2 days prior experienced chest palpitations, blurrred vision and dizziness.  Performing normal ADLs prior to event.  Event last <1 minute.  No chest pain.  No nausea.  Some hydration that day.  Generally takes about 12 oz coffee daily.  This occurred about 6 hours after that single intake.  Hx of PVCs elevated with carotid doppler, holter.  Never stressed and uncertain regarding the echocardiogram.  This is the only time the PVCs have been symptomatic.  No hx thyroid illness and has been evaluated.      Allergies   Allergen Reactions   . Nsaids        Current Outpatient Prescriptions   Medication Sig Dispense Refill   . atorvastatin (LIPITOR) 40 MG tablet Take 1 tablet (40 mg total) by mouth daily. 90 tablet 0   . calcium carbonate (TUMS) 500 MG chewable tablet Chew 1 tablet (500 mg total) by mouth daily. 180 tablet 0   . Estradiol 10 MCG Tab Place intravaginally twice weekly 24 tablet 0   . fluticasone (FLONASE) 50 MCG/ACT nasal spray 2 sprays by Nasal route daily. 16 g 5   . glucose blood (FREESTYLE LITE) test strip Pt uses freestyle freedom lyte test strips testing BID prn 300 each 3   . Lancets (FREESTYLE) lancets Use twice daily as needed 300 each 3   . lidocaine (LIDODERM) 5 % Place 1 patch onto affected skin every twelve hours as needed for discomfort.  Remove each patch after 12 hours of use 30 patch 2   . losartan (COZAAR) 25 MG tablet Take 1 tablet (25 mg total) by mouth daily. 90 tablet 0   . metFORMIN (GLUCOPHAGE) 500 MG tablet Take 1 tablet (500 mg total) by mouth nightly. 90 tablet 0   . pantoprazole (PROTONIX) 40 MG tablet Take 1 tablet (40 mg total) by mouth daily. 90 tablet 1   . SITagliptin-metFORMIN (JANUMET) 50-500 MG per tablet Take 1 tablet by mouth 2 (two) times daily with meals. 180 tablet 0     No current facility-administered medications for this visit.        Review of Systems    Constitutional: Positive for diaphoresis. Negative for fever, malaise/fatigue and weight loss.   HENT: Negative for sore throat.    Eyes: Negative for blurred vision.   Respiratory: Negative for cough and shortness of breath.    Cardiovascular: Positive for palpitations. Negative for chest pain, leg swelling and PND.   Gastrointestinal: Negative for nausea and vomiting.   Musculoskeletal: Negative for myalgias.   Skin: Negative for rash.   Neurological: Positive for dizziness. Negative for tingling, tremors, speech change, focal weakness, weakness and headaches.       All other systems were reviewed and are negative    O:  BP 146/87 (BP Site: Left arm)   Pulse 80   Temp 97.8 F (36.6 C) (Oral)   Wt 70.8 kg (156 lb)   BMI 27.64 kg/m , Body mass index is 27.64 kg/m.  Vital signs reviewed  GEN:  NAD, nonobese  NEURO:  CN2-12 without focal defecit, nml gait and station  CVS:  rrr -m/r/g  PULM: cta anteriorly  SKIN:  Warm, dry  EXT:  No edema, +2 radial pulse b/l    A/P:     1. Heart palpitations  Ambulatory referral to Cardiology  Discussed potential for betablocker  F/u post cards evaluation         Risk & Benefits of the new medication(s) were explained to the patient (and family) who verbalized understanding & agreed to the treatment plan. Patient (family) encouraged to contact me/clinical staff with any questions/concerns    Raj Janus, MD

## 2016-08-19 NOTE — Progress Notes (Signed)
Nursing Documentation:  Limb alert status: Patient asked and denied any limb restrictions for blood pressure/blood draws.  Has the patient seen any other providers since their last visit: Yes, was seen at [facility] ED   The patient is due for eye exam, foot exam, pneumonia vaccine and [facility] letter, AWV

## 2016-08-20 MED ORDER — METFORMIN HCL 500 MG PO TABS
500.0000 mg | ORAL_TABLET | Freq: Every evening | ORAL | 3 refills | Status: DC
Start: 2016-08-20 — End: 2016-08-23

## 2016-08-23 ENCOUNTER — Other Ambulatory Visit (INDEPENDENT_AMBULATORY_CARE_PROVIDER_SITE_OTHER): Payer: Self-pay | Admitting: Family Medicine

## 2016-08-23 DIAGNOSIS — E119 Type 2 diabetes mellitus without complications: Secondary | ICD-10-CM

## 2016-08-23 MED ORDER — METFORMIN HCL ER 500 MG PO TB24
500.0000 mg | ORAL_TABLET | Freq: Every evening | ORAL | 3 refills | Status: DC
Start: 2016-08-23 — End: 2017-07-26

## 2016-08-23 MED ORDER — METFORMIN HCL ER 500 MG PO TB24
500.0000 mg | ORAL_TABLET | Freq: Every evening | ORAL | 3 refills | Status: DC
Start: 2016-08-23 — End: 2016-08-23

## 2016-08-30 ENCOUNTER — Ambulatory Visit (INDEPENDENT_AMBULATORY_CARE_PROVIDER_SITE_OTHER): Payer: Medicare Other | Admitting: Family Medicine

## 2016-08-30 ENCOUNTER — Encounter (INDEPENDENT_AMBULATORY_CARE_PROVIDER_SITE_OTHER): Payer: Self-pay | Admitting: Family Medicine

## 2016-08-30 VITALS — BP 148/88 | HR 73 | Temp 97.9°F | Ht 63.0 in | Wt 156.4 lb

## 2016-08-30 DIAGNOSIS — N183 Chronic kidney disease, stage 3 unspecified: Secondary | ICD-10-CM

## 2016-08-30 DIAGNOSIS — E7800 Pure hypercholesterolemia, unspecified: Secondary | ICD-10-CM

## 2016-08-30 DIAGNOSIS — E538 Deficiency of other specified B group vitamins: Secondary | ICD-10-CM

## 2016-08-30 DIAGNOSIS — D696 Thrombocytopenia, unspecified: Secondary | ICD-10-CM

## 2016-08-30 DIAGNOSIS — I10 Essential (primary) hypertension: Secondary | ICD-10-CM

## 2016-08-30 DIAGNOSIS — Z0000 Encounter for general adult medical examination without abnormal findings: Secondary | ICD-10-CM

## 2016-08-30 DIAGNOSIS — E119 Type 2 diabetes mellitus without complications: Secondary | ICD-10-CM

## 2016-08-30 DIAGNOSIS — Z8639 Personal history of other endocrine, nutritional and metabolic disease: Secondary | ICD-10-CM

## 2016-08-30 DIAGNOSIS — M300 Polyarteritis nodosa: Secondary | ICD-10-CM

## 2016-08-30 MED ORDER — LOSARTAN POTASSIUM 25 MG PO TABS
25.0000 mg | ORAL_TABLET | Freq: Two times a day (BID) | ORAL | 0 refills | Status: DC
Start: 2016-08-30 — End: 2016-09-21

## 2016-08-30 MED ORDER — VITAMIN D 1000 UNITS PO TABS
1000.0000 [IU] | ORAL_TABLET | Freq: Every day | ORAL | Status: DC
Start: 2016-08-30 — End: 2016-11-11

## 2016-08-30 NOTE — Progress Notes (Signed)
Nursing Documentation:  Limb alert status: Patient asked and denied any limb restrictions for blood pressure/blood draws.  Has the patient seen any other providers since their last visit: no  The patient is due for eye exam, foot exam, pneumonia vaccine and [facility]
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
## 2016-08-30 NOTE — Progress Notes (Signed)
Chief Complaint   Patient presents with   . Hypertension   . Diabetes         S:      Problem   History of Vitamin D Deficiency    1000 IU twice daily, no issues with this tx.     Hypercholesterolemia    No issues with lipitor.  Adheres lf/lc diet.     Essential Hypertension    Recently with elevation in her evening BP to ~160-170 SBP despite good hydration and using cozaar regularly.  No smoking.  No NSAIDs.  No excessive EtOH.  Has reduced sodium but to uncertain amount.  Exercising (walking) seems to help.  No decongestants.  Has used cozaar with additional dose with some benefit and we are looking to make that a routine.     Type 2 Diabetes Mellitus Without Complications    Hx gestational diabetes.  Last eye exam about 2015.  On cozaar.  On statin.  Avoids ASA due to PAN.  Last HgBA1c in 05/2015.     Chronic Kidney Disease, Stage III (Moderate)    relatively stable CKD stage 3 w/ sCr ranging from 1.0 to 1.3 since 2003.  eGFR= 48.  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         Polyarteritis Nodosa    Dx in 2003.  S/p chemotx.  Stopped celcept in 2008.  CKD level 3 is due to the PAN.             Allergies   Allergen Reactions   . Nsaids        Current Outpatient Prescriptions   Medication Sig Dispense Refill   . atorvastatin (LIPITOR) 40 MG tablet Take 1 tablet (40 mg total) by mouth daily. 90 tablet 0   . calcium carbonate (TUMS) 500 MG chewable tablet Chew 1 tablet (500 mg total) by mouth daily. 180 tablet 0   . Estradiol 10 MCG Tab Place intravaginally twice weekly 24 tablet 0   . fluticasone (FLONASE) 50 MCG/ACT nasal spray 2 sprays by Nasal route daily. 16 g 5   . glucose blood (FREESTYLE LITE) test strip Pt uses freestyle freedom lyte test strips testing BID prn 300 each 3   . Lancets (FREESTYLE) lancets Use twice daily as needed 300 each 3   . lidocaine (LIDODERM) 5 % Place 1 patch onto affected skin every twelve hours as needed for discomfort.   Remove each patch after 12 hours of use 30 patch 2   . losartan (COZAAR) 25 MG tablet Take 1 tablet (25 mg total) by mouth 2 (two) times daily. 180 tablet 0   . metFORMIN (GLUCOPHAGE XR) 500 MG 24 hr tablet Take 1 tablet (500 mg total) by mouth nightly. 90 tablet 3   . pantoprazole (PROTONIX) 40 MG tablet Take 1 tablet (40 mg total) by mouth daily. 90 tablet 1   . SITagliptin-metFORMIN (JANUMET) 50-500 MG per tablet Take 1 tablet by mouth 2 (two) times daily with meals. 180 tablet 0   . loperamide (IMODIUM) 2 MG capsule      . vitamin D (CHOLECALCIFEROL) 1000 UNIT tablet Take 1 tablet (1,000 Units total) by mouth daily.       No current facility-administered medications for this visit.        Review of Systems   Constitutional: Negative for fever and weight loss.   HENT: Negative for sore throat.    Eyes: Negative for blurred vision.  Respiratory: Negative for cough and shortness of breath.    Cardiovascular: Negative for chest pain and palpitations.   Gastrointestinal: Negative for nausea and vomiting.   Musculoskeletal: Negative for myalgias.   Skin: Negative for rash.   Neurological: Negative for headaches.       All other systems were reviewed and are negative    O:  BP 148/88 (BP Site: Left arm, Patient Position: Sitting)   Pulse 73   Temp 97.9 F (36.6 C) (Oral)   Ht 1.6 m (5\' 3" )   Wt 70.9 kg (156 lb 6.4 oz)   BMI 27.71 kg/m , Body mass index is 27.71 kg/m.  Vital signs reviewed  GEN:  NAD, nonobese  NEURO:  CN2-12 without focal defecit, nml gait and station  CVS:  rrr -m/r/g  SKIN:  Warm, dry  EXT:  No edema, +2 radial pulse b/l    A/P:     1. Routine general medical examination at a health care facility  CBC and differential    Comprehensive metabolic panel    Lipid panel    Urinalysis    Hemoglobin A1C    Folate    Vitamin B12    Vitamin D,25 OH, Total    PTH, Intact    Calcium, ionized    losartan (COZAAR) 25 MG tablet   2. Type 2 diabetes mellitus without complication, without long-term current  use of insulin  Hemoglobin A1C   3. Thrombocytopenia     4. Low vitamin B12 level  Folate    Vitamin B12   5. Hypercholesterolemia     6. History of vitamin D deficiency  Vitamin D,25 OH, Total    vitamin D (CHOLECALCIFEROL) 1000 UNIT tablet   7. Essential hypertension  Increase to bid losartan (COZAAR) 25 MG tablet   8. Hypercalcemia  PTH, Intact    Calcium, ionized   9. Chronic kidney disease, stage III (moderate)     10. Polyarteritis nodosa         F/u ~2 weeks for BP recheck, review labs, and CPE  Limit sodium    Risk & Benefits of the new medication(s) were explained to the patient (and family) who verbalized understanding & agreed to the treatment plan. Patient (family) encouraged to contact me/clinical staff with any questions/concerns    Raj Janus, MD

## 2016-08-31 ENCOUNTER — Other Ambulatory Visit (FREE_STANDING_LABORATORY_FACILITY): Payer: Medicare Other

## 2016-08-31 DIAGNOSIS — Z8639 Personal history of other endocrine, nutritional and metabolic disease: Secondary | ICD-10-CM

## 2016-08-31 DIAGNOSIS — E119 Type 2 diabetes mellitus without complications: Secondary | ICD-10-CM

## 2016-08-31 DIAGNOSIS — E538 Deficiency of other specified B group vitamins: Secondary | ICD-10-CM

## 2016-08-31 DIAGNOSIS — Z0000 Encounter for general adult medical examination without abnormal findings: Secondary | ICD-10-CM

## 2016-08-31 LAB — COMPREHENSIVE METABOLIC PANEL
ALT: 10 U/L (ref 0–55)
AST (SGOT): 18 U/L (ref 5–34)
Albumin/Globulin Ratio: 1.4 (ref 0.9–2.2)
Albumin: 3.8 g/dL (ref 3.5–5.0)
Alkaline Phosphatase: 72 U/L (ref 37–106)
BUN: 19 mg/dL (ref 7.0–19.0)
Bilirubin, Total: 0.6 mg/dL (ref 0.1–1.2)
CO2: 27 mEq/L (ref 21–29)
Calcium: 9.8 mg/dL (ref 8.5–10.5)
Chloride: 104 mEq/L (ref 100–111)
Creatinine: 1 mg/dL (ref 0.4–1.5)
Globulin: 2.8 g/dL (ref 2.0–3.7)
Glucose: 139 mg/dL — ABNORMAL HIGH (ref 70–100)
Potassium: 5 mEq/L (ref 3.5–5.1)
Protein, Total: 6.6 g/dL (ref 6.0–8.3)
Sodium: 141 mEq/L (ref 135–146)

## 2016-08-31 LAB — LIPID PANEL
Cholesterol / HDL Ratio: 3.2
Cholesterol: 171 mg/dL (ref 0–199)
HDL: 53 mg/dL (ref 40–9999)
LDL Calculated: 99 mg/dL (ref 0–99)
Triglycerides: 97 mg/dL (ref 34–149)
VLDL Calculated: 19 mg/dL (ref 10–40)

## 2016-08-31 LAB — URINALYSIS
Bilirubin, UA: NEGATIVE
Blood, UA: NEGATIVE
Glucose, UA: NEGATIVE
Ketones UA: NEGATIVE
Leukocyte Esterase, UA: NEGATIVE
Nitrite, UA: NEGATIVE
Protein, UR: 100 — AB
Specific Gravity UA: 1.014 (ref 1.001–1.035)
Urine pH: 7 (ref 5.0–8.0)
Urobilinogen, UA: 0.2

## 2016-08-31 LAB — HEMOGLOBIN A1C
Average Estimated Glucose: 142.7 mg/dL
Hemoglobin A1C: 6.6 % — ABNORMAL HIGH (ref 4.6–5.9)

## 2016-08-31 LAB — URINE MICROSCOPIC

## 2016-08-31 LAB — GFR: EGFR: 55.5

## 2016-08-31 LAB — CBC AND DIFFERENTIAL
Absolute NRBC: 0 10*3/uL
Basophils Absolute Automated: 0.03 10*3/uL (ref 0.00–0.20)
Basophils Automated: 0.8 %
Eosinophils Absolute Automated: 0.04 10*3/uL (ref 0.00–0.70)
Eosinophils Automated: 1 %
Hematocrit: 41.3 % (ref 37.0–47.0)
Hgb: 12.7 g/dL (ref 12.0–16.0)
Immature Granulocytes Absolute: 0.01 10*3/uL
Immature Granulocytes: 0.3 %
Lymphocytes Absolute Automated: 1.22 10*3/uL (ref 0.50–4.40)
Lymphocytes Automated: 31.4 %
MCH: 27.7 pg — ABNORMAL LOW (ref 28.0–32.0)
MCHC: 30.8 g/dL — ABNORMAL LOW (ref 32.0–36.0)
MCV: 90 fL (ref 80.0–100.0)
MPV: 12 fL (ref 9.4–12.3)
Monocytes Absolute Automated: 0.32 10*3/uL (ref 0.00–1.20)
Monocytes: 8.2 %
Neutrophils Absolute: 2.27 10*3/uL (ref 1.80–8.10)
Neutrophils: 58.3 %
Nucleated RBC: 0 /100 WBC (ref 0.0–1.0)
Platelets: 157 10*3/uL (ref 140–400)
RBC: 4.59 10*6/uL (ref 4.20–5.40)
RDW: 14 % (ref 12–15)
WBC: 3.89 10*3/uL (ref 3.50–10.80)

## 2016-08-31 LAB — FOLATE: Folate: 8.5 ng/mL

## 2016-08-31 LAB — VITAMIN D,25 OH,TOTAL: Vitamin D, 25 OH, Total: 45 ng/mL (ref 30–100)

## 2016-08-31 LAB — HEMOLYSIS INDEX: Hemolysis Index: 9 (ref 0–18)

## 2016-08-31 LAB — CALCIUM, IONIZED: Calcium, Ionized: 2.7 mEq/L — ABNORMAL HIGH (ref 2.30–2.58)

## 2016-08-31 LAB — VITAMIN B12: Vitamin B-12: 903 pg/mL (ref 211–911)

## 2016-08-31 LAB — PTH, INTACT: PTH Intact: 35.2 pg/mL (ref 9.0–72.0)

## 2016-09-08 DIAGNOSIS — M5030 Other cervical disc degeneration, unspecified cervical region: Secondary | ICD-10-CM | POA: Insufficient documentation

## 2016-09-09 ENCOUNTER — Ambulatory Visit (INDEPENDENT_AMBULATORY_CARE_PROVIDER_SITE_OTHER): Payer: Medicare Other | Admitting: Cardiovascular Disease

## 2016-09-09 ENCOUNTER — Encounter (INDEPENDENT_AMBULATORY_CARE_PROVIDER_SITE_OTHER): Payer: Self-pay | Admitting: Cardiovascular Disease

## 2016-09-09 VITALS — BP 144/89 | HR 78 | Ht 63.0 in | Wt 155.0 lb

## 2016-09-09 DIAGNOSIS — E119 Type 2 diabetes mellitus without complications: Secondary | ICD-10-CM

## 2016-09-09 DIAGNOSIS — N183 Chronic kidney disease, stage 3 unspecified: Secondary | ICD-10-CM

## 2016-09-09 DIAGNOSIS — I10 Essential (primary) hypertension: Secondary | ICD-10-CM

## 2016-09-09 DIAGNOSIS — I493 Ventricular premature depolarization: Secondary | ICD-10-CM

## 2016-09-09 DIAGNOSIS — E7800 Pure hypercholesterolemia, unspecified: Secondary | ICD-10-CM

## 2016-09-09 DIAGNOSIS — R42 Dizziness and giddiness: Secondary | ICD-10-CM

## 2016-09-09 HISTORY — DX: Ventricular premature depolarization: I49.3
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
## 2016-09-13 ENCOUNTER — Ambulatory Visit (INDEPENDENT_AMBULATORY_CARE_PROVIDER_SITE_OTHER): Payer: Medicare Other | Admitting: Family Medicine

## 2016-09-13 ENCOUNTER — Encounter (INDEPENDENT_AMBULATORY_CARE_PROVIDER_SITE_OTHER): Payer: Self-pay | Admitting: Family Medicine

## 2016-09-13 VITALS — BP 130/75 | HR 84 | Temp 98.5°F | Ht 63.0 in | Wt 154.6 lb

## 2016-09-13 DIAGNOSIS — H35379 Puckering of macula, unspecified eye: Secondary | ICD-10-CM

## 2016-09-13 DIAGNOSIS — D696 Thrombocytopenia, unspecified: Secondary | ICD-10-CM

## 2016-09-13 DIAGNOSIS — I493 Ventricular premature depolarization: Secondary | ICD-10-CM

## 2016-09-13 DIAGNOSIS — Z86010 Personal history of colonic polyps: Secondary | ICD-10-CM

## 2016-09-13 DIAGNOSIS — Z0000 Encounter for general adult medical examination without abnormal findings: Secondary | ICD-10-CM

## 2016-09-13 DIAGNOSIS — E119 Type 2 diabetes mellitus without complications: Secondary | ICD-10-CM

## 2016-09-13 DIAGNOSIS — I10 Essential (primary) hypertension: Secondary | ICD-10-CM

## 2016-09-13 DIAGNOSIS — Z1159 Encounter for screening for other viral diseases: Secondary | ICD-10-CM

## 2016-09-13 DIAGNOSIS — N952 Postmenopausal atrophic vaginitis: Secondary | ICD-10-CM

## 2016-09-13 DIAGNOSIS — Z8639 Personal history of other endocrine, nutritional and metabolic disease: Secondary | ICD-10-CM

## 2016-09-13 DIAGNOSIS — M7502 Adhesive capsulitis of left shoulder: Secondary | ICD-10-CM

## 2016-09-13 DIAGNOSIS — E7800 Pure hypercholesterolemia, unspecified: Secondary | ICD-10-CM

## 2016-09-13 DIAGNOSIS — M7061 Trochanteric bursitis, right hip: Secondary | ICD-10-CM

## 2016-09-13 DIAGNOSIS — K219 Gastro-esophageal reflux disease without esophagitis: Secondary | ICD-10-CM

## 2016-09-13 DIAGNOSIS — H524 Presbyopia: Secondary | ICD-10-CM

## 2016-09-13 DIAGNOSIS — M300 Polyarteritis nodosa: Secondary | ICD-10-CM

## 2016-09-13 DIAGNOSIS — N183 Chronic kidney disease, stage 3 unspecified: Secondary | ICD-10-CM

## 2016-09-13 NOTE — Progress Notes (Signed)
Nursing Documentation:  Limb alert status: Patient asked and denied any limb restrictions for blood pressure/blood draws.  Has the patient seen any other providers since their last visit: GI  The patient is due for colonoscopy
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
## 2016-09-15 ENCOUNTER — Ambulatory Visit (INDEPENDENT_AMBULATORY_CARE_PROVIDER_SITE_OTHER): Payer: Medicare Other

## 2016-09-15 DIAGNOSIS — N183 Chronic kidney disease, stage 3 (moderate): Secondary | ICD-10-CM

## 2016-09-15 DIAGNOSIS — E119 Type 2 diabetes mellitus without complications: Secondary | ICD-10-CM

## 2016-09-15 DIAGNOSIS — E7800 Pure hypercholesterolemia, unspecified: Secondary | ICD-10-CM

## 2016-09-15 DIAGNOSIS — R42 Dizziness and giddiness: Secondary | ICD-10-CM

## 2016-09-15 DIAGNOSIS — I10 Essential (primary) hypertension: Secondary | ICD-10-CM

## 2016-09-15 DIAGNOSIS — I493 Ventricular premature depolarization: Secondary | ICD-10-CM

## 2016-09-21 ENCOUNTER — Other Ambulatory Visit (INDEPENDENT_AMBULATORY_CARE_PROVIDER_SITE_OTHER): Payer: Self-pay | Admitting: Family Medicine

## 2016-09-21 ENCOUNTER — Encounter (INDEPENDENT_AMBULATORY_CARE_PROVIDER_SITE_OTHER): Payer: Self-pay | Admitting: Family Medicine

## 2016-09-21 DIAGNOSIS — Z0000 Encounter for general adult medical examination without abnormal findings: Secondary | ICD-10-CM

## 2016-09-21 DIAGNOSIS — I10 Essential (primary) hypertension: Secondary | ICD-10-CM

## 2016-09-21 DIAGNOSIS — E119 Type 2 diabetes mellitus without complications: Secondary | ICD-10-CM

## 2016-09-21 MED ORDER — SITAGLIPTIN PHOS-METFORMIN HCL 50-500 MG PO TABS
1.0000 | ORAL_TABLET | Freq: Two times a day (BID) | ORAL | 3 refills | Status: DC
Start: 2016-09-21 — End: 2016-10-05

## 2016-09-21 MED ORDER — LOSARTAN POTASSIUM 25 MG PO TABS
25.0000 mg | ORAL_TABLET | Freq: Two times a day (BID) | ORAL | 3 refills | Status: DC
Start: 2016-09-21 — End: 2016-10-03

## 2016-09-29 ENCOUNTER — Telehealth (INDEPENDENT_AMBULATORY_CARE_PROVIDER_SITE_OTHER): Payer: Self-pay

## 2016-09-29 NOTE — Telephone Encounter (Signed)
Katelyn Caldwell called pt and lvm

## 2016-09-29 NOTE — Telephone Encounter (Signed)
-----   Message from Angie Fava sent at 09/29/2016  8:52 AM EST -----  Regarding: HOLTER  Contact: [phone]  This patient would like to know if she can go to the [place] office tomorrow to get a holter. Are there any holters there?    [employer]

## 2016-09-30 ENCOUNTER — Ambulatory Visit (INDEPENDENT_AMBULATORY_CARE_PROVIDER_SITE_OTHER): Payer: Medicare Other | Admitting: Cardiovascular Disease

## 2016-09-30 DIAGNOSIS — E7800 Pure hypercholesterolemia, unspecified: Secondary | ICD-10-CM

## 2016-09-30 DIAGNOSIS — I10 Essential (primary) hypertension: Secondary | ICD-10-CM

## 2016-09-30 DIAGNOSIS — R42 Dizziness and giddiness: Secondary | ICD-10-CM

## 2016-09-30 DIAGNOSIS — N183 Chronic kidney disease, stage 3 unspecified: Secondary | ICD-10-CM

## 2016-09-30 DIAGNOSIS — E119 Type 2 diabetes mellitus without complications: Secondary | ICD-10-CM

## 2016-09-30 DIAGNOSIS — I493 Ventricular premature depolarization: Secondary | ICD-10-CM

## 2016-10-01 ENCOUNTER — Encounter (INDEPENDENT_AMBULATORY_CARE_PROVIDER_SITE_OTHER): Payer: Self-pay | Admitting: Family Medicine

## 2016-10-01 ENCOUNTER — Other Ambulatory Visit (INDEPENDENT_AMBULATORY_CARE_PROVIDER_SITE_OTHER): Payer: Self-pay | Admitting: Family Medicine

## 2016-10-01 DIAGNOSIS — I10 Essential (primary) hypertension: Secondary | ICD-10-CM

## 2016-10-01 DIAGNOSIS — Z0000 Encounter for general adult medical examination without abnormal findings: Secondary | ICD-10-CM

## 2016-10-01 DIAGNOSIS — N952 Postmenopausal atrophic vaginitis: Secondary | ICD-10-CM

## 2016-10-01 DIAGNOSIS — E119 Type 2 diabetes mellitus without complications: Secondary | ICD-10-CM

## 2016-10-03 ENCOUNTER — Encounter (INDEPENDENT_AMBULATORY_CARE_PROVIDER_SITE_OTHER): Payer: Self-pay | Admitting: Family Medicine

## 2016-10-03 MED ORDER — LOSARTAN POTASSIUM 25 MG PO TABS
25.0000 mg | ORAL_TABLET | Freq: Two times a day (BID) | ORAL | 3 refills | Status: DC
Start: 2016-10-03 — End: 2018-03-14

## 2016-10-03 NOTE — Addendum Note (Signed)
Addended by: Raj Janus on: 10/03/2016 10:33 AM     Modules accepted: Orders

## 2016-10-04 MED ORDER — ESTRADIOL 10 MCG VA TABS
ORAL_TABLET | VAGINAL | 3 refills | Status: DC
Start: 2016-10-04 — End: 2017-09-18

## 2016-10-05 ENCOUNTER — Ambulatory Visit (INDEPENDENT_AMBULATORY_CARE_PROVIDER_SITE_OTHER): Payer: Medicare Other | Admitting: Cardiovascular Disease

## 2016-10-05 DIAGNOSIS — I10 Essential (primary) hypertension: Secondary | ICD-10-CM

## 2016-10-05 DIAGNOSIS — I493 Ventricular premature depolarization: Secondary | ICD-10-CM

## 2016-10-05 DIAGNOSIS — N183 Chronic kidney disease, stage 3 unspecified: Secondary | ICD-10-CM

## 2016-10-05 DIAGNOSIS — E119 Type 2 diabetes mellitus without complications: Secondary | ICD-10-CM

## 2016-10-05 DIAGNOSIS — E7800 Pure hypercholesterolemia, unspecified: Secondary | ICD-10-CM

## 2016-10-05 DIAGNOSIS — R42 Dizziness and giddiness: Secondary | ICD-10-CM

## 2016-10-05 MED ORDER — SITAGLIP PHOS-METFORMIN HCL ER 50-500 MG PO TB24
1.0000 | ORAL_TABLET | Freq: Two times a day (BID) | ORAL | 3 refills | Status: DC
Start: 2016-10-05 — End: 2017-09-18

## 2016-10-05 NOTE — Procedures (Signed)
[facility]  Exercise Stress Test    Patient: Katelyn Caldwell  Sex: female  DOB: Dec 07, 1950  MRN:  [id number]     Test Date:  10/05/2016     Interpretation Date: 10/05/2016    Referring Physician: Raj Janus, MD, Dr Miachel Roux.    INDICATION:   1. Hypercholesterolemia    2. Essential hypertension    3. Chronic kidney disease, stage III (moderate)    4. Type 2 diabetes mellitus without complication, without long-term current use of insulin    5. Premature ventricular contractions (PVCs) (VPCs)    6. Dizziness          GRADED EXERCISE STRESS    Blood Pressure: Rest = 86 Maximum = 159    Heart Rate:    Rest = 160/80 Maximum = 200/80    Exercise Time: 9 minutes, 01 seconds.     METS achieved: 10.1.  Percentage of target heart rate achieved: 103 %.  Test stopped due to: SOB    Patient Symptoms: None  Blood pressure response: Hypertensive  Arrhythmias: None     Resting ECG: Normal sinus rhythm and non-specific ST/T wave changes.  Stress ECG: Normal ECG stress test with no ischemic ECG changes.      CONCLUSION / RECOMMENDATION:    1. This is a Normal Exercise Treadmill Test, with no evidence of ischemia by Symptoms or EKG Criteria.  2. The patient demonstrated a above average functional capacity for their age and gender.   3. Patient will be notified that their study was satisfactory.      Signed by: Maceo Pro, MD, [facility]  [facility]

## 2016-10-07 NOTE — Procedures (Signed)
Katelyn Caldwell  24 hour Holter         Katelyn Caldwell       [id number]    female       1951/02/15      Ordering Physician: Miachel Roux, MD, [facility]    Primary Cardiologist: Miachel Roux, MD, [facility]    Indication:   1. Hypercholesterolemia    2. Essential hypertension    3. Chronic kidney disease, stage III (moderate)    4. Type 2 diabetes mellitus without complication, without long-term current use of insulin    5. Premature ventricular contractions (PVCs) (VPCs)    6. Dizziness        Data     Test date: 09/30/2016  Recording time: 01 d 23 hours 59 mins  Quality: good   Tech comments: none      Heart Rate data     Total beats: 216991   Min HR: 54 bpm at 05:07 D2   Avg HR: 74 bpm   Max HR: 116 bpm at 21:40 D2     Patient Triggers: 4    Diary Entries     : 8  AF Summary    AF Burden: 0.0%  AF Events: 0   Longest: 0  Fastest: 0   Slowest: 0 BPM Bradycardia    Bradycardia(<50 BPM): 0  Pauses(>2000 ms: 0   Longest pause : 0.00 s    Ventricular Ectopy     Total VE beats: 0 (<0.1 %)   Isolated: 0 events (<0.1 %)   couplet: 0 beats (<0.1 %)   Triplet: 0 bpm (<0.1 %)   IVR: 0 events (<0.1 %)   Bigeminy: 0 0 m 0 s (longest)   Trigeminy: 00 m 0 s (longest)   VT :0     Supraventricular Ectopy      Total SVE beats: 59 (<0.1 %)    Isolated: 35 (<0.1 %)    Pair 2 (<0.1 %)    Run(>=3): 22 (<0.1 %)      Symptoms reported: none      Intepretation     Baseline rhythm: Sinus rhythm    Symptom correlation with arrhythmia: good      Katelyn Caldwell, Katelyn Caldwell was in Sinus Rhythm.  The average heart rate, excluding ectopy, was 74 BPM with a minimum of 54 BPM at 05:07 D2 and a maximum of 116 BPM at   21:40 D2.  Heart beats, including ectopy, totaled [phone] beats.  There were no VENTRICULAR ectopics found.    SUPRAVENTRICULAR ECTOPICS totaled 59 averaging 1.2 per hour, with 35 single and 2 paired beats.  SUPRAVENTRICULAR TACHYCARDIA occurred 3 times.  The fastest run was at 164 BPM and occurred at 12:05 D1 with 10 beats.  The longest run was 10 beats at  12:05 D1 at a rate of 164 BPM.    Patient diary was submitted. Patient events were noted.    Impression     A 10 beat atrial run at 164/min noted.  Occ. PAC,s noted.    Miachel Roux, MD, [facility]

## 2016-10-20 ENCOUNTER — Other Ambulatory Visit (INDEPENDENT_AMBULATORY_CARE_PROVIDER_SITE_OTHER): Payer: Self-pay | Admitting: Family Medicine

## 2016-10-20 DIAGNOSIS — E119 Type 2 diabetes mellitus without complications: Secondary | ICD-10-CM

## 2016-10-20 DIAGNOSIS — E7800 Pure hypercholesterolemia, unspecified: Secondary | ICD-10-CM

## 2016-10-20 MED ORDER — ATORVASTATIN CALCIUM 40 MG PO TABS
40.0000 mg | ORAL_TABLET | Freq: Every day | ORAL | 3 refills | Status: DC
Start: 2016-10-20 — End: 2017-09-18

## 2016-10-25 ENCOUNTER — Encounter (INDEPENDENT_AMBULATORY_CARE_PROVIDER_SITE_OTHER): Payer: Self-pay | Admitting: Cardiovascular Disease

## 2016-10-25 ENCOUNTER — Other Ambulatory Visit (INDEPENDENT_AMBULATORY_CARE_PROVIDER_SITE_OTHER): Payer: Self-pay | Admitting: Family Medicine

## 2016-10-25 MED ORDER — PANTOPRAZOLE SODIUM 40 MG PO TBEC
40.0000 mg | DELAYED_RELEASE_TABLET | Freq: Every day | ORAL | 3 refills | Status: DC
Start: 2016-10-25 — End: 2017-01-17

## 2016-11-11 ENCOUNTER — Other Ambulatory Visit (INDEPENDENT_AMBULATORY_CARE_PROVIDER_SITE_OTHER): Payer: Self-pay | Admitting: Family Medicine

## 2016-11-11 ENCOUNTER — Ambulatory Visit (INDEPENDENT_AMBULATORY_CARE_PROVIDER_SITE_OTHER): Payer: Medicare Other | Admitting: Cardiovascular Disease

## 2016-11-11 DIAGNOSIS — Z8639 Personal history of other endocrine, nutritional and metabolic disease: Secondary | ICD-10-CM

## 2016-11-11 MED ORDER — VITAMIN D 1000 UNITS PO TABS
1000.0000 [IU] | ORAL_TABLET | Freq: Every day | ORAL | Status: DC
Start: 2016-11-11 — End: 2016-12-15

## 2016-12-12 ENCOUNTER — Other Ambulatory Visit (INDEPENDENT_AMBULATORY_CARE_PROVIDER_SITE_OTHER): Payer: Self-pay | Admitting: Family Medicine

## 2016-12-12 ENCOUNTER — Ambulatory Visit (INDEPENDENT_AMBULATORY_CARE_PROVIDER_SITE_OTHER): Payer: Self-pay

## 2016-12-12 DIAGNOSIS — R059 Cough, unspecified: Secondary | ICD-10-CM

## 2016-12-15 ENCOUNTER — Ambulatory Visit (INDEPENDENT_AMBULATORY_CARE_PROVIDER_SITE_OTHER): Payer: Medicare Other | Admitting: Family Medicine

## 2016-12-15 ENCOUNTER — Encounter (INDEPENDENT_AMBULATORY_CARE_PROVIDER_SITE_OTHER): Payer: Self-pay | Admitting: Family Medicine

## 2016-12-15 VITALS — BP 137/80 | HR 77 | Temp 97.4°F | Wt 158.0 lb

## 2016-12-15 DIAGNOSIS — I493 Ventricular premature depolarization: Secondary | ICD-10-CM

## 2016-12-15 DIAGNOSIS — Z8639 Personal history of other endocrine, nutritional and metabolic disease: Secondary | ICD-10-CM

## 2016-12-15 MED ORDER — VITAMIN D 1000 UNITS PO TABS
1000.0000 [IU] | ORAL_TABLET | Freq: Every day | ORAL | 3 refills | Status: DC
Start: 2016-12-15 — End: 2018-01-22

## 2016-12-15 MED ORDER — CYANOCOBALAMIN 1000 MCG PO TABS
1000.0000 ug | ORAL_TABLET | Freq: Every day | ORAL | 3 refills | Status: DC
Start: 2016-12-15 — End: 2018-01-22

## 2016-12-15 NOTE — Progress Notes (Signed)
1. Have you self referred yourself since we last saw you?    Refer to care team   Or  Add specialists:    No

## 2016-12-15 NOTE — Progress Notes (Signed)
Chief Complaint   Patient presents with   . Urgent Care Follow Up         S:      Problem   Premature Ventricular Contractions (Pvcs) (Vpcs)    PT recently evaluated by cardiology and underwent stress test and echocardiogram only noted for mild septal thickening and rare PVC.  Had palpitations which have reduced with initiation of toprol.    Pt notes at [facility] for chest pain and URI and underwent EKG. 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  She feels fine. Denies chest pain.  Sx resolved spontaneously and have not returned.     Pt would like refill on her vitamin D supplement.    Allergies   Allergen Reactions   . Nsaids        Current Outpatient Prescriptions   Medication Sig Dispense Refill   . atorvastatin (LIPITOR) 40 MG tablet Take 1 tablet (40 mg total) by mouth daily. 90 tablet 3   . calcium carbonate (TUMS) 500 MG chewable tablet Chew 1 tablet (500 mg total) by mouth daily. 180 tablet 0   . Estradiol 10 MCG Tab Place intravaginally twice weekly 24 tablet 3   . fluticasone (FLONASE) 50 MCG/ACT nasal spray 2 sprays by Nasal route daily. 16 g 5   . glucose blood (FREESTYLE LITE) test strip Pt uses freestyle freedom lyte test strips testing BID prn 300 each 3   . Lancets (FREESTYLE) lancets Use twice daily as needed 300 each 3   . lidocaine (LIDODERM) 5 % Place 1 patch onto affected skin every twelve hours as needed for discomfort.  Remove each patch after 12 hours of use 30 patch 2   . losartan (COZAAR) 25 MG tablet Take 1 tablet (25 mg total) by mouth 2 (two) times daily. 180 tablet 3   . metFORMIN (GLUCOPHAGE XR) 500 MG 24 hr tablet Take 1 tablet (500 mg total) by mouth nightly. 90 tablet 3   . metoprolol succinate XL (TOPROL-XL) 25 MG 24 hr tablet Take by mouth.     . pantoprazole (PROTONIX) 40 MG tablet Take 1 tablet (40 mg total) by mouth daily. 90 tablet 3   . SITagliptin-MetFORMIN HCl (JANUMET XR) 50-500 MG Tablet SR 24 hr Take 1 tablet by mouth 2 (two)  times daily with meals. 180 tablet 3   . vitamin D (CHOLECALCIFEROL) 1000 UNIT tablet Take 1 tablet (1,000 Units total) by mouth daily. 90 tablet 3   . cyanocobalamin ([employer] VITAMIN B12) 1000 MCG tablet Take 1 tablet (1,000 mcg total) by mouth daily. 90 tablet 3     No current facility-administered medications for this visit.        Review of Systems   Constitutional: Negative for fever and weight loss.   HENT: Negative for sore throat.    Eyes: Negative for blurred vision.   Respiratory: Negative for cough and shortness of breath.    Cardiovascular: Positive for palpitations. Negative for chest pain, leg swelling and PND.   Gastrointestinal: Negative for nausea and vomiting.   Musculoskeletal: Negative for myalgias.   Skin: Negative for rash.   Neurological: Negative for headaches.       All other systems were reviewed and are negative    O:  BP 137/80 (BP Site: Right arm, Patient Position: Sitting)   Pulse 77   Temp 97.4 F (36.3 C) (Oral)   Wt 71.7 kg (158 lb)  BMI 27.99 kg/m , Body mass index is 27.99 kg/m.  Vital signs reviewed  GEN:  NAD, nonobese  NEURO:  CN2-12 without focal defecit, nml gait and station  SKIN:  Warm, dry  EXT:  No edema, +2 radial pulse b/l    A/P:     1. Premature ventricular contractions (PVCs) (VPCs)  Reviewed the patients EKG and what Q waves are and how to use them to dx old MI which patient has not had per my interpretation of her EKG from the urgent care.   2. History of vitamin D deficiency  vitamin D (CHOLECALCIFEROL) 1000 UNIT tablet         Risk & Benefits of the new medication(s) were explained to the patient (and family) who verbalized understanding & agreed to the treatment plan. Patient (family) encouraged to contact me/clinical staff with any questions/concerns    Raj Janus, MD

## 2017-01-17 ENCOUNTER — Other Ambulatory Visit (INDEPENDENT_AMBULATORY_CARE_PROVIDER_SITE_OTHER): Payer: Self-pay | Admitting: Family Medicine

## 2017-01-17 MED ORDER — PANTOPRAZOLE SODIUM 40 MG PO TBEC
40.0000 mg | DELAYED_RELEASE_TABLET | Freq: Every day | ORAL | 3 refills | Status: DC
Start: 2017-01-17 — End: 2017-12-27

## 2017-01-18 ENCOUNTER — Encounter (INDEPENDENT_AMBULATORY_CARE_PROVIDER_SITE_OTHER): Payer: Self-pay

## 2017-04-07 ENCOUNTER — Telehealth (INDEPENDENT_AMBULATORY_CARE_PROVIDER_SITE_OTHER): Payer: Self-pay | Admitting: Family Medicine

## 2017-04-07 MED ORDER — CALCIUM CARBONATE ANTACID 500 MG PO CHEW
1.0000 | CHEWABLE_TABLET | Freq: Every day | ORAL | 0 refills | Status: DC
Start: 2017-04-07 — End: 2017-04-13

## 2017-04-12 ENCOUNTER — Encounter (INDEPENDENT_AMBULATORY_CARE_PROVIDER_SITE_OTHER): Payer: Self-pay | Admitting: Family Medicine

## 2017-04-13 MED ORDER — CALCIUM CARBONATE ANTACID 500 MG PO CHEW
1.0000 | CHEWABLE_TABLET | Freq: Every day | ORAL | 3 refills | Status: DC
Start: 2017-04-13 — End: 2017-04-17

## 2017-04-17 ENCOUNTER — Other Ambulatory Visit (INDEPENDENT_AMBULATORY_CARE_PROVIDER_SITE_OTHER): Payer: Self-pay | Admitting: Family Medicine

## 2017-04-17 MED ORDER — CALCIUM CARBONATE ANTACID 500 MG PO CHEW
1.0000 | CHEWABLE_TABLET | Freq: Every day | ORAL | 3 refills | Status: DC
Start: 2017-04-17 — End: 2017-05-22

## 2017-04-17 NOTE — Telephone Encounter (Signed)
Pharmacy is requesting calcium carbonate (TUMS) 500 MG chewable tablet be resent they could not take a verbal.

## 2017-04-18 ENCOUNTER — Encounter (INDEPENDENT_AMBULATORY_CARE_PROVIDER_SITE_OTHER): Payer: Self-pay | Admitting: Family Medicine

## 2017-04-18 ENCOUNTER — Telehealth (INDEPENDENT_AMBULATORY_CARE_PROVIDER_SITE_OTHER): Payer: Self-pay | Admitting: Family Medicine

## 2017-04-18 NOTE — Telephone Encounter (Signed)
Katelyn Caldwell from [facility] called stating they do not carry Rx calcium carbonate (TUMS) 500 MG chewable tablet. Lelon Mast stated they have the non chewable, however doctor would have to send over a new Rx that is non chewable.

## 2017-05-22 ENCOUNTER — Encounter (INDEPENDENT_AMBULATORY_CARE_PROVIDER_SITE_OTHER): Payer: Self-pay | Admitting: Family Medicine

## 2017-05-22 ENCOUNTER — Ambulatory Visit (FREE_STANDING_LABORATORY_FACILITY): Payer: Medicare Other | Admitting: Family Medicine

## 2017-05-22 VITALS — BP 154/79 | HR 64 | Temp 98.0°F | Wt 156.0 lb

## 2017-05-22 DIAGNOSIS — I493 Ventricular premature depolarization: Secondary | ICD-10-CM

## 2017-05-22 DIAGNOSIS — J323 Chronic sphenoidal sinusitis: Secondary | ICD-10-CM

## 2017-05-22 DIAGNOSIS — I10 Essential (primary) hypertension: Secondary | ICD-10-CM

## 2017-05-22 DIAGNOSIS — Z01818 Encounter for other preprocedural examination: Secondary | ICD-10-CM

## 2017-05-22 LAB — URINALYSIS
Bilirubin, UA: NEGATIVE
Blood, UA: NEGATIVE
Glucose, UA: NEGATIVE
Ketones UA: NEGATIVE
Leukocyte Esterase, UA: NEGATIVE
Nitrite, UA: NEGATIVE
Protein, UR: 30 — AB
Specific Gravity UA: 1.008 (ref 1.001–1.035)
Urine pH: 6 (ref 5.0–8.0)
Urobilinogen, UA: 0.2

## 2017-05-22 LAB — CBC AND DIFFERENTIAL
Absolute NRBC: 0 10*3/uL
Basophils Absolute Automated: 0.02 10*3/uL (ref 0.00–0.20)
Basophils Automated: 0.4 %
Eosinophils Absolute Automated: 0.07 10*3/uL (ref 0.00–0.70)
Eosinophils Automated: 1.5 %
Hematocrit: 41.6 % (ref 37.0–47.0)
Hgb: 12.9 g/dL (ref 12.0–16.0)
Immature Granulocytes Absolute: 0.01 10*3/uL
Immature Granulocytes: 0.2 %
Lymphocytes Absolute Automated: 1.29 10*3/uL (ref 0.50–4.40)
Lymphocytes Automated: 27.2 %
MCH: 28 pg (ref 28.0–32.0)
MCHC: 31 g/dL — ABNORMAL LOW (ref 32.0–36.0)
MCV: 90.2 fL (ref 80.0–100.0)
MPV: 12.4 fL — ABNORMAL HIGH (ref 9.4–12.3)
Monocytes Absolute Automated: 0.35 10*3/uL (ref 0.00–1.20)
Monocytes: 7.4 %
Neutrophils Absolute: 3 10*3/uL (ref 1.80–8.10)
Neutrophils: 63.3 %
Nucleated RBC: 0 /100 WBC (ref 0.0–1.0)
Platelets: 147 10*3/uL (ref 140–400)
RBC: 4.61 10*6/uL (ref 4.20–5.40)
RDW: 14 % (ref 12–15)
WBC: 4.74 10*3/uL (ref 3.50–10.80)

## 2017-05-22 LAB — COMPREHENSIVE METABOLIC PANEL
ALT: 9 U/L (ref 0–55)
AST (SGOT): 19 U/L (ref 5–34)
Albumin/Globulin Ratio: 1.4 (ref 0.9–2.2)
Albumin: 4.1 g/dL (ref 3.5–5.0)
Alkaline Phosphatase: 67 U/L (ref 37–106)
BUN: 21 mg/dL — ABNORMAL HIGH (ref 7.0–19.0)
Bilirubin, Total: 0.4 mg/dL (ref 0.1–1.2)
CO2: 25 mEq/L (ref 21–29)
Calcium: 10.2 mg/dL (ref 8.5–10.5)
Chloride: 103 mEq/L (ref 100–111)
Creatinine: 1.1 mg/dL (ref 0.4–1.5)
Globulin: 2.9 g/dL (ref 2.0–3.7)
Glucose: 106 mg/dL — ABNORMAL HIGH (ref 70–100)
Potassium: 5.2 mEq/L — ABNORMAL HIGH (ref 3.5–5.1)
Protein, Total: 7 g/dL (ref 6.0–8.3)
Sodium: 138 mEq/L (ref 136–145)

## 2017-05-22 LAB — URINE MICROSCOPIC

## 2017-05-22 LAB — HEMOLYSIS INDEX: Hemolysis Index: 10 (ref 0–18)

## 2017-05-22 LAB — HEMOGLOBIN A1C
Average Estimated Glucose: 145.6 mg/dL
Hemoglobin A1C: 6.7 % — ABNORMAL HIGH (ref 4.6–5.9)

## 2017-05-22 LAB — GFR: EGFR: 49.6

## 2017-05-22 MED ORDER — AMOXICILLIN-POT CLAVULANATE 875-125 MG PO TABS
1.0000 | ORAL_TABLET | Freq: Two times a day (BID) | ORAL | 0 refills | Status: AC
Start: 2017-05-22 — End: 2017-06-01

## 2017-05-22 NOTE — Progress Notes (Signed)
Have you seen any specialists/other providers since your last visit with [place]?      No      Arm preference verified?     Yes    The patient is due for eye exam, foot exam, shingles vaccine and PCMH letter, Microalbumin    Dr. Sherrilyn Rist   Fax number for surgeons #[phone]
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
## 2017-05-24 ENCOUNTER — Encounter (INDEPENDENT_AMBULATORY_CARE_PROVIDER_SITE_OTHER): Payer: Self-pay

## 2017-05-24 NOTE — Progress Notes (Signed)
Please call pt to inform them that their lab data were within acceptable limits.  She is dehydrated and needs to do better with that and she has a slight elevation in her potassium.  Her sugar is slightly higher.  She will be cleared for surgery.  They may follow-up as discussed, sooner with any problems or concerns.

## 2017-06-03 HISTORY — PX: SINUS SURGERY: SHX187

## 2017-06-09 ENCOUNTER — Encounter (INDEPENDENT_AMBULATORY_CARE_PROVIDER_SITE_OTHER): Payer: Self-pay | Admitting: Family Medicine

## 2017-06-16 ENCOUNTER — Encounter (INDEPENDENT_AMBULATORY_CARE_PROVIDER_SITE_OTHER): Payer: Self-pay | Admitting: Family Medicine

## 2017-06-28 ENCOUNTER — Encounter (INDEPENDENT_AMBULATORY_CARE_PROVIDER_SITE_OTHER): Payer: Self-pay | Admitting: Family Medicine

## 2017-07-03 ENCOUNTER — Encounter (INDEPENDENT_AMBULATORY_CARE_PROVIDER_SITE_OTHER): Payer: Self-pay | Admitting: Family Medicine

## 2017-07-03 ENCOUNTER — Ambulatory Visit (FREE_STANDING_LABORATORY_FACILITY): Payer: Medicare Other | Admitting: Family Medicine

## 2017-07-03 VITALS — BP 118/66 | HR 78 | Temp 97.7°F | Ht 63.0 in | Wt 154.0 lb

## 2017-07-03 DIAGNOSIS — N3000 Acute cystitis without hematuria: Secondary | ICD-10-CM

## 2017-07-03 LAB — POCT URINALYSIS DIPSTIX (10)(MULTI-TEST)
Bilirubin, UA POCT: NEGATIVE
Glucose, UA POCT: NEGATIVE mg/dL
Ketones, UA POCT: NEGATIVE mg/dL
Nitrite, UA POCT: NEGATIVE
POCT Spec Gravity, UA: 1.015 (ref 1.001–1.035)
POCT pH, UA: 6 (ref 5–8)
Urobilinogen, UA: 0.2 mg/dL

## 2017-07-03 MED ORDER — CIPROFLOXACIN HCL 250 MG PO TABS
250.0000 mg | ORAL_TABLET | Freq: Two times a day (BID) | ORAL | 0 refills | Status: AC
Start: 2017-07-03 — End: 2017-07-10

## 2017-07-03 NOTE — Progress Notes (Signed)
Chief Complaint   Patient presents with   . Urinary Tract Infection Symptoms     pain, burning, frequency         S:    Hx of recurrent UTI and was on macrobid prophylactly for long term but none for 22 months.  About 10 days ago with burning dysruia and seen at [facility] with UTI tx with macrobid bid for 5 days.  Sx improved but not resolve.  cx with UTI susceptible to macrobid.  A couple days ago sx returned after macrobid completed.  No fevers nor back pain.      Allergies   Allergen Reactions   . Nsaids        Current Outpatient Prescriptions   Medication Sig Dispense Refill   . atorvastatin (LIPITOR) 40 MG tablet Take 1 tablet (40 mg total) by mouth daily. 90 tablet 3   . CALCIUM CARBONATE PO TAKE 1 TABLET BY MOUTH EVERY DAY 500mg      . cyanocobalamin (CVS VITAMIN B12) 1000 MCG tablet Take 1 tablet (1,000 mcg total) by mouth daily. 90 tablet 3   . Estradiol 10 MCG Tab Place intravaginally twice weekly 24 tablet 3   . fluticasone (FLONASE) 50 MCG/ACT nasal spray 2 sprays by Nasal route daily. 16 g 5   . glucose blood (FREESTYLE LITE) test strip Pt uses freestyle freedom lyte test strips testing BID prn 300 each 3   . Lancets (FREESTYLE) lancets Use twice daily as needed 300 each 3   . lidocaine (LIDODERM) 5 % Place 1 patch onto affected skin every twelve hours as needed for discomfort.  Remove each patch after 12 hours of use 30 patch 2   . losartan (COZAAR) 25 MG tablet Take 1 tablet (25 mg total) by mouth 2 (two) times daily. 180 tablet 3   . metFORMIN (GLUCOPHAGE XR) 500 MG 24 hr tablet Take 1 tablet (500 mg total) by mouth nightly. 90 tablet 3   . metoprolol succinate XL (TOPROL-XL) 25 MG 24 hr tablet Take by mouth.     . pantoprazole (PROTONIX) 40 MG tablet Take 1 tablet (40 mg total) by mouth daily. 90 tablet 3   . SITagliptin-MetFORMIN HCl (JANUMET XR) 50-500 MG Tablet SR 24 hr Take 1 tablet by mouth 2 (two) times daily with meals. 180 tablet 3   . vitamin D (CHOLECALCIFEROL) 1000 UNIT tablet Take 1 tablet  (1,000 Units total) by mouth daily. 90 tablet 3   . ciprofloxacin (CIPRO) 250 MG tablet Take 1 tablet (250 mg total) by mouth 2 (two) times daily.for 7 days 14 tablet 0     No current facility-administered medications for this visit.        Review of Systems   Constitutional: Negative for fever, malaise/fatigue and weight loss.   Cardiovascular: Negative for chest pain.   Gastrointestinal: Negative for nausea and vomiting.   Genitourinary: Positive for dysuria and frequency. Negative for hematuria.   Musculoskeletal: Negative for myalgias.   Skin: Negative for rash.   Neurological: Negative for sensory change and headaches.   Endo/Heme/Allergies: Negative for polydipsia.   All other systems reviewed and are negative.      All other systems were reviewed and are negative    O:  BP 118/66 (BP Site: Left arm, Patient Position: Sitting)   Pulse 78   Temp 97.7 F (36.5 C) (Oral)   Ht 1.6 m (5\' 3" )   Wt 69.9 kg (154 lb)   BMI 27.28 kg/m , Body mass index  is 27.28 kg/m.  Vital signs reviewed  GEN:  NAD, nonobese  NEURO:  CN2-12 without focal defecit, nml gait and station  CVS:  rrr -m/r/g  SKIN:  Warm, dry  EXT:  No edema, +2 radial pulse b/l    A/P:     1. Acute cystitis without hematuria  ciprofloxacin (CIPRO) 250 MG tablet    POCT UA Dipstix (10)(Multi-Test)    Urine culture         Risk & Benefits of the new medication(s) were explained to the patient (and family) who verbalized understanding & agreed to the treatment plan. Patient (family) encouraged to contact me/clinical staff with any questions/concerns    Raj Janus, MD

## 2017-07-03 NOTE — Progress Notes (Signed)
Have you seen any specialists/other providers since your last visit with us?      No      Arm preference verified?     Yes    The patient is due for various.

## 2017-07-05 ENCOUNTER — Encounter (INDEPENDENT_AMBULATORY_CARE_PROVIDER_SITE_OTHER): Payer: Self-pay

## 2017-07-05 NOTE — Progress Notes (Signed)
Please call pt to inform them that their lab data were within acceptable limits.  Continue the abx and complete them as the sample may have been tainted by the prior abx.  They may follow-up as discussed, sooner with any problems or concerns.

## 2017-07-26 ENCOUNTER — Other Ambulatory Visit (INDEPENDENT_AMBULATORY_CARE_PROVIDER_SITE_OTHER): Payer: Self-pay | Admitting: Family Medicine

## 2017-07-28 MED ORDER — METFORMIN HCL ER 500 MG PO TB24
500.0000 mg | ORAL_TABLET | Freq: Every evening | ORAL | 3 refills | Status: DC
Start: 2017-07-28 — End: 2018-07-24

## 2017-07-28 MED ORDER — FLUTICASONE PROPIONATE 50 MCG/ACT NA SUSP
2.0000 | Freq: Every day | NASAL | 3 refills | Status: DC
Start: 2017-07-28 — End: 2018-07-28

## 2017-07-28 MED ORDER — GLUCOSE BLOOD VI STRP
ORAL_STRIP | 3 refills | Status: DC
Start: 2017-07-28 — End: 2018-12-18

## 2017-08-02 ENCOUNTER — Encounter: Payer: Self-pay | Admitting: Gastroenterology

## 2017-09-18 ENCOUNTER — Encounter (INDEPENDENT_AMBULATORY_CARE_PROVIDER_SITE_OTHER): Payer: Self-pay | Admitting: Family Medicine

## 2017-09-18 ENCOUNTER — Ambulatory Visit (INDEPENDENT_AMBULATORY_CARE_PROVIDER_SITE_OTHER): Payer: Medicare Other | Admitting: Family Medicine

## 2017-09-18 VITALS — BP 130/77 | HR 80 | Temp 98.2°F | Ht 63.0 in | Wt 152.4 lb

## 2017-09-18 DIAGNOSIS — N183 Chronic kidney disease, stage 3 unspecified: Secondary | ICD-10-CM

## 2017-09-18 DIAGNOSIS — E7800 Pure hypercholesterolemia, unspecified: Secondary | ICD-10-CM

## 2017-09-18 DIAGNOSIS — K219 Gastro-esophageal reflux disease without esophagitis: Secondary | ICD-10-CM

## 2017-09-18 DIAGNOSIS — I493 Ventricular premature depolarization: Secondary | ICD-10-CM

## 2017-09-18 DIAGNOSIS — M7502 Adhesive capsulitis of left shoulder: Secondary | ICD-10-CM

## 2017-09-18 DIAGNOSIS — E119 Type 2 diabetes mellitus without complications: Secondary | ICD-10-CM

## 2017-09-18 DIAGNOSIS — I10 Essential (primary) hypertension: Secondary | ICD-10-CM

## 2017-09-18 DIAGNOSIS — Z0000 Encounter for general adult medical examination without abnormal findings: Secondary | ICD-10-CM

## 2017-09-18 DIAGNOSIS — N952 Postmenopausal atrophic vaginitis: Secondary | ICD-10-CM

## 2017-09-18 DIAGNOSIS — M7061 Trochanteric bursitis, right hip: Secondary | ICD-10-CM

## 2017-09-18 MED ORDER — SITAGLIP PHOS-METFORMIN HCL ER 50-500 MG PO TB24
1.0000 | ORAL_TABLET | Freq: Two times a day (BID) | ORAL | 3 refills | Status: DC
Start: 2017-09-18 — End: 2019-09-11

## 2017-09-18 MED ORDER — ESTRADIOL 10 MCG VA TABS
ORAL_TABLET | VAGINAL | 3 refills | Status: DC
Start: 2017-09-18 — End: 2018-11-13

## 2017-09-18 MED ORDER — ATORVASTATIN CALCIUM 40 MG PO TABS
40.0000 mg | ORAL_TABLET | Freq: Every day | ORAL | 3 refills | Status: DC
Start: 2017-09-18 — End: 2019-10-01

## 2017-09-18 NOTE — Progress Notes (Signed)
Have you seen any specialists/other providers since your last visit with [place]?    Yes    Arm preference verified?   No    The patient is due for foot exam, shingles vaccine and urine microalbumin, pcmh.
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
## 2017-09-20 ENCOUNTER — Ambulatory Visit (INDEPENDENT_AMBULATORY_CARE_PROVIDER_SITE_OTHER): Payer: Medicare Other | Admitting: Gastroenterology

## 2017-09-20 ENCOUNTER — Encounter: Payer: Self-pay | Admitting: Gastroenterology

## 2017-09-20 VITALS — BP 112/66 | HR 84 | Ht 61.0 in | Wt 145.4 lb

## 2017-09-20 DIAGNOSIS — Z1211 Encounter for screening for malignant neoplasm of colon: Secondary | ICD-10-CM

## 2017-09-20 DIAGNOSIS — K219 Gastro-esophageal reflux disease without esophagitis: Secondary | ICD-10-CM | POA: Diagnosis not present

## 2017-09-20 DIAGNOSIS — K5901 Slow transit constipation: Secondary | ICD-10-CM | POA: Diagnosis not present

## 2017-09-20 DIAGNOSIS — Z1212 Encounter for screening for malignant neoplasm of rectum: Secondary | ICD-10-CM

## 2017-09-20 DIAGNOSIS — R110 Nausea: Secondary | ICD-10-CM | POA: Diagnosis not present

## 2017-09-20 NOTE — Progress Notes (Addendum)
History of Present Illness: This is a 66 year old female referred by Roylene ReasonMarilyn Grossman, NP for the evaluation of GERD, constipation, nausea.  She is accompanied by her husband.  She states she has had GERD for many years and her symptoms are currently well controlled on daily Nexium.  She has intermittent problems with nausea that is clearly related to her episodes of dizziness.  It is not associated with reflux flares or meals.  She states she takes Tums intermittently for breakthrough GERD symptoms.  She has chronic constipation which is currently well controlled taking prunes and prune juice on a daily basis.  I reviewed records from her PCP Roylene ReasonMarilyn Grossman, NP, from Dr. Samuella CotaPandya from 2016 and 2017 and from Dr. Aleene DavidsonSpainhour from 2005 to 2012.  She underwent EGD in May 2017 for dysphagia showing Candida esophagitis, hiatal hernia and an empiric esophageal dilation was performed.  A modified barium esophagram performed in 2016 that felt her oropharyngeal function was normal reflux was suspected.  Barium esophagram performed in May 2016 showed a small hiatal hernia and moderate reflux. Colonoscopy by Dr. Aleene DavidsonSpainhour in October 2012 was normal.  EGD by Dr. Aleene DavidsonSpainhour in October 2010 showed a hiatal hernia and diffuse gastritis with antral erosions.  Gastric biopsies showed mild chronic gastritis.  EGD in July 2010 showed a hiatal hernia with distal esophageal erosions and possible short segment Barrett's esophagus.  Esophageal biopsies showed benign fragments of esophageal mucosa and gastric cardia mucosa.  No evidence of Barrett's.  EGD in July 2005 for GERD symptoms showed a hiatal hernia and esophageal erythema, otherwise unremarkable.  Colonoscopy in December 2001 showed mild diverticulosis and internal hemorrhoids.  Denies weight loss, abdominal pain, diarrhea, change in stool caliber, melena, hematochezia, nausea, vomiting, dysphagia, chest pain.   Allergies  Allergen Reactions  . Aspirin Nausea And  Vomiting  . Codeine Nausea And Vomiting   Outpatient Medications Prior to Visit  Medication Sig Dispense Refill  . amitriptyline (ELAVIL) 50 MG tablet Take 100 mg by mouth at bedtime.    . cetirizine (ZYRTEC) 10 MG tablet Take 10 mg by mouth daily.    Marland Kitchen. denosumab (PROLIA) 60 MG/ML SOLN injection Inject 60 mg into the skin every 6 (six) months. Administer in upper arm, thigh, or abdomen    . esomeprazole (NEXIUM) 40 MG capsule Take 40 mg by mouth daily at 12 noon.    Marland Kitchen. levothyroxine (SYNTHROID, LEVOTHROID) 25 MCG tablet Take 25 mcg by mouth daily before breakfast.    . LORazepam (ATIVAN) 1 MG tablet Take 1 mg by mouth daily.    . sertraline (ZOLOFT) 25 MG tablet Take 25 mg by mouth daily.    . simvastatin (ZOCOR) 20 MG tablet Take 20 mg by mouth daily.     No facility-administered medications prior to visit.    Past Medical History:  Diagnosis Date  . Anxiety   . Osteoporosis   . Stroke (HCC)   . Thyroid disease    Past Surgical History:  Procedure Laterality Date  . ABDOMINAL HYSTERECTOMY    . CERVICAL SPINE SURGERY    . FACIAL FRACTURE SURGERY     Social History   Socioeconomic History  . Marital status: Married    Spouse name: None  . Number of children: 2  . Years of education: None  . Highest education level: None  Social Needs  . Financial resource strain: None  . Food insecurity - worry: None  . Food insecurity - inability: None  .  Transportation needs - medical: None  . Transportation needs - non-medical: None  Occupational History  . Occupation: unemployed  Tobacco Use  . Smoking status: Former Games developermoker  . Smokeless tobacco: Never Used  Substance and Sexual Activity  . Alcohol use: No    Frequency: Never  . Drug use: No  . Sexual activity: None  Other Topics Concern  . None  Social History Narrative  . None   Family History  Problem Relation Age of Onset  . Heart disease Mother   . Other Father        unknown  . Emphysema Brother   . Heart disease  Maternal Grandfather   . Colon cancer Neg Hx   . Liver cancer Neg Hx   . Stomach cancer Neg Hx       Review of Systems: Pertinent positive and negative review of systems were noted in the above HPI section. All other review of systems were otherwise negative.    Physical Exam: General: Well developed, well nourished, no acute distress Head: Normocephalic and atraumatic Eyes:  sclerae anicteric, EOMI Ears: Normal auditory acuity Mouth: No deformity or lesions Neck: Supple, no masses or thyromegaly Lungs: Clear throughout to auscultation Heart: Regular rate and rhythm; no murmurs, rubs or bruits Abdomen: Soft, non tender and non distended. No masses, hepatosplenomegaly or hernias noted. Normal Bowel sounds Rectal: not done Musculoskeletal: Symmetrical with no gross deformities  Skin: No lesions on visible extremities Pulses:  Normal pulses noted Extremities: No clubbing, cyanosis, edema or deformities noted Neurological: Alert oriented x 4, grossly nonfocal Cervical Nodes:  No significant cervical adenopathy Inguinal Nodes: No significant inguinal adenopathy Psychological:  Alert and cooperative. Normal mood and affect  Assessment and Recommendations:  1.  GERD.  Continue standard antireflux measures.  Continue Nexium 40 mg daily.  Reviewed extensive records from prior endoscopies and related pathology.  2.  Chronic constipation.  Continue regimen of prunes and prune juice daily.  At least 6 glasses of water per day.  3.  CRC screening, average risk.  Reviewed records from 2 prior colonoscopies.  Screening colonoscopy recommended at 10 years from her last colonoscopy which is October 2022.  4.  Nausea related to dizziness.  Does not appear to be GI related.  in   cc: Roylene ReasonMarilyn Grossman, NP

## 2017-09-20 NOTE — Patient Instructions (Signed)
Stay on Nexium daily and continue eating prunes daily for constipation.   Thank you for choosing me and [facility].  Venita LickMalcolm T. Pleas KochStark, Jr., MD., Clementeen GrahamFACG

## 2017-09-20 NOTE — Addendum Note (Signed)
Addended by: Claudette HeadSTARK, Librada Castronovo T on: 09/20/2017 04:52 PM   Modules accepted: Level of Service

## 2017-09-21 ENCOUNTER — Other Ambulatory Visit (FREE_STANDING_LABORATORY_FACILITY): Payer: Medicare Other

## 2017-09-21 DIAGNOSIS — E119 Type 2 diabetes mellitus without complications: Secondary | ICD-10-CM

## 2017-09-21 DIAGNOSIS — E7800 Pure hypercholesterolemia, unspecified: Secondary | ICD-10-CM

## 2017-09-21 DIAGNOSIS — Z0000 Encounter for general adult medical examination without abnormal findings: Secondary | ICD-10-CM

## 2017-09-21 LAB — COMPREHENSIVE METABOLIC PANEL
ALT: 9 U/L (ref 0–55)
AST (SGOT): 16 U/L (ref 5–34)
Albumin/Globulin Ratio: 1.4 (ref 0.9–2.2)
Albumin: 3.8 g/dL (ref 3.5–5.0)
Alkaline Phosphatase: 86 U/L (ref 37–106)
BUN: 22 mg/dL — ABNORMAL HIGH (ref 7.0–19.0)
Bilirubin, Total: 0.5 mg/dL (ref 0.2–1.2)
CO2: 25 mEq/L (ref 21–29)
Calcium: 9.6 mg/dL (ref 8.5–10.5)
Chloride: 104 mEq/L (ref 100–111)
Creatinine: 1 mg/dL (ref 0.4–1.5)
Globulin: 2.7 g/dL (ref 2.0–3.7)
Glucose: 139 mg/dL — ABNORMAL HIGH (ref 70–100)
Potassium: 5.4 mEq/L — ABNORMAL HIGH (ref 3.5–5.1)
Protein, Total: 6.5 g/dL (ref 6.0–8.3)
Sodium: 138 mEq/L (ref 136–145)

## 2017-09-21 LAB — LIPID PANEL
Cholesterol / HDL Ratio: 3.3
Cholesterol: 157 mg/dL (ref 0–199)
HDL: 47 mg/dL (ref 40–9999)
LDL Calculated: 96 mg/dL (ref 0–99)
Triglycerides: 68 mg/dL (ref 34–149)
VLDL Calculated: 14 mg/dL (ref 10–40)

## 2017-09-21 LAB — CBC AND DIFFERENTIAL
Absolute NRBC: 0 10*3/uL
Basophils Absolute Automated: 0.03 10*3/uL (ref 0.00–0.20)
Basophils Automated: 0.6 %
Eosinophils Absolute Automated: 0.09 10*3/uL (ref 0.00–0.70)
Eosinophils Automated: 1.7 %
Hematocrit: 39.7 % (ref 37.0–47.0)
Hgb: 12.3 g/dL (ref 12.0–16.0)
Immature Granulocytes Absolute: 0.02 10*3/uL
Immature Granulocytes: 0.4 %
Lymphocytes Absolute Automated: 1.35 10*3/uL (ref 0.50–4.40)
Lymphocytes Automated: 25.7 %
MCH: 27.5 pg — ABNORMAL LOW (ref 28.0–32.0)
MCHC: 31 g/dL — ABNORMAL LOW (ref 32.0–36.0)
MCV: 88.6 fL (ref 80.0–100.0)
MPV: 12.7 fL — ABNORMAL HIGH (ref 9.4–12.3)
Monocytes Absolute Automated: 0.49 10*3/uL (ref 0.00–1.20)
Monocytes: 9.3 %
Neutrophils Absolute: 3.27 10*3/uL (ref 1.80–8.10)
Neutrophils: 62.3 %
Nucleated RBC: 0 /100 WBC (ref 0.0–1.0)
Platelets: 159 10*3/uL (ref 140–400)
RBC: 4.48 10*6/uL (ref 4.20–5.40)
RDW: 13 % (ref 12–15)
WBC: 5.25 10*3/uL (ref 3.50–10.80)

## 2017-09-21 LAB — MICROALBUMIN, RANDOM URINE
Urine Creatinine, Random: 64.9 mg/dL
Urine Microalbumin, Random: 247 — ABNORMAL HIGH (ref 0.0–30.0)
Urine Microalbumin/Creatinine Ratio: 381 ug/mg — ABNORMAL HIGH (ref 0–30)

## 2017-09-21 LAB — GFR: EGFR: 55.3

## 2017-09-21 LAB — HEMOLYSIS INDEX: Hemolysis Index: 12 (ref 0–18)

## 2017-09-22 ENCOUNTER — Encounter (INDEPENDENT_AMBULATORY_CARE_PROVIDER_SITE_OTHER): Payer: Self-pay

## 2017-09-22 LAB — HEMOGLOBIN A1C
Average Estimated Glucose: 154.2 mg/dL
Hemoglobin A1C: 7 % — ABNORMAL HIGH (ref 4.6–5.9)

## 2017-09-22 NOTE — Progress Notes (Signed)
Please call pt to inform them that their lab data showed her sugar is slightly worse.  Potassium is high.  She should limit potassium rich foods and recheck that electrolyte in 1-2 weeks.  Continue current medications.  Limit carb intake further as possible for her sugar issue.

## 2017-10-09 DIAGNOSIS — M7730 Calcaneal spur, unspecified foot: Secondary | ICD-10-CM | POA: Insufficient documentation

## 2017-10-09 DIAGNOSIS — M79673 Pain in unspecified foot: Secondary | ICD-10-CM | POA: Insufficient documentation

## 2017-10-09 DIAGNOSIS — L84 Corns and callosities: Secondary | ICD-10-CM | POA: Insufficient documentation

## 2017-10-09 DIAGNOSIS — M2010 Hallux valgus (acquired), unspecified foot: Secondary | ICD-10-CM | POA: Insufficient documentation

## 2017-10-22 ENCOUNTER — Encounter (INDEPENDENT_AMBULATORY_CARE_PROVIDER_SITE_OTHER): Payer: Self-pay | Admitting: Family Medicine

## 2017-10-22 ENCOUNTER — Other Ambulatory Visit (INDEPENDENT_AMBULATORY_CARE_PROVIDER_SITE_OTHER): Payer: Self-pay | Admitting: Family Medicine

## 2017-10-23 MED ORDER — LIDOCAINE 5 % EX PTCH
MEDICATED_PATCH | CUTANEOUS | 2 refills | Status: DC
Start: 2017-10-23 — End: 2020-08-18

## 2017-10-23 MED ORDER — METOPROLOL SUCCINATE ER 25 MG PO TB24
25.00 mg | ORAL_TABLET | Freq: Every day | ORAL | 3 refills | Status: AC
Start: 2017-10-23 — End: 2018-10-23

## 2017-10-30 ENCOUNTER — Encounter: Payer: Self-pay | Admitting: Neurology

## 2017-12-05 ENCOUNTER — Other Ambulatory Visit (INDEPENDENT_AMBULATORY_CARE_PROVIDER_SITE_OTHER): Payer: Medicare Other

## 2017-12-05 ENCOUNTER — Ambulatory Visit (INDEPENDENT_AMBULATORY_CARE_PROVIDER_SITE_OTHER): Payer: Medicare Other | Admitting: Neurology

## 2017-12-05 ENCOUNTER — Other Ambulatory Visit: Payer: Self-pay | Admitting: *Deleted

## 2017-12-05 ENCOUNTER — Encounter: Payer: Self-pay | Admitting: Neurology

## 2017-12-05 VITALS — BP 120/60 | HR 100 | Ht 61.5 in | Wt 145.0 lb

## 2017-12-05 DIAGNOSIS — Z79899 Other long term (current) drug therapy: Secondary | ICD-10-CM

## 2017-12-05 DIAGNOSIS — E039 Hypothyroidism, unspecified: Secondary | ICD-10-CM

## 2017-12-05 DIAGNOSIS — R9089 Other abnormal findings on diagnostic imaging of central nervous system: Secondary | ICD-10-CM

## 2017-12-05 DIAGNOSIS — G43109 Migraine with aura, not intractable, without status migrainosus: Secondary | ICD-10-CM

## 2017-12-05 LAB — COMPREHENSIVE METABOLIC PANEL
ALK PHOS: 100 U/L (ref 39–117)
ALT: 28 U/L (ref 0–35)
AST: 29 U/L (ref 0–37)
Albumin: 4.6 g/dL (ref 3.5–5.2)
BILIRUBIN TOTAL: 0.3 mg/dL (ref 0.2–1.2)
BUN: 10 mg/dL (ref 6–23)
CO2: 26 mEq/L (ref 19–32)
CREATININE: 1.05 mg/dL (ref 0.40–1.20)
Calcium: 10.3 mg/dL (ref 8.4–10.5)
Chloride: 97 mEq/L (ref 96–112)
GFR: 55.53 mL/min — ABNORMAL LOW (ref >60.00)
GLUCOSE: 99 mg/dL (ref 70–99)
POTASSIUM: 4.6 meq/L (ref 3.5–5.1)
SODIUM: 132 meq/L — AB (ref 135–145)
TOTAL PROTEIN: 7.6 g/dL (ref 6.0–8.3)

## 2017-12-05 LAB — CBC WITH DIFFERENTIAL/PLATELET
Basophils Absolute: 0.1 10*3/uL (ref 0.0–0.1)
Basophils Relative: 0.9 % (ref 0.0–3.0)
EOS ABS: 0.3 10*3/uL (ref 0.0–0.7)
EOS PCT: 3.8 % (ref 0.0–5.0)
HCT: 35.7 % — ABNORMAL LOW (ref 36.0–46.0)
Hemoglobin: 12 g/dL (ref 12.0–15.0)
Lymphocytes Relative: 13.1 % (ref 12.0–46.0)
Lymphs Abs: 0.9 10*3/uL (ref 0.7–4.0)
MCHC: 33.8 g/dL (ref 30.0–36.0)
MCV: 78.7 fl (ref 78.0–100.0)
MONO ABS: 0.4 10*3/uL (ref 0.1–1.0)
Monocytes Relative: 6.1 % (ref 3.0–12.0)
NEUTROS PCT: 76.1 % (ref 43.0–77.0)
Neutro Abs: 5 10*3/uL (ref 1.4–7.7)
Platelets: 208 10*3/uL (ref 150.0–400.0)
RBC: 4.53 Mil/uL (ref 3.87–5.11)
RDW: 14.3 % (ref 11.5–15.5)
WBC: 6.6 10*3/uL (ref 4.0–10.5)

## 2017-12-05 MED ORDER — DIVALPROEX SODIUM ER 500 MG PO TB24: 500.0000 mg | ORAL_TABLET | Freq: Every day | ORAL | 3 refills | Status: DC

## 2017-12-05 MED ORDER — ONDANSETRON HCL 4 MG PO TABS: 4.0000 mg | ORAL_TABLET | Freq: Three times a day (TID) | ORAL | 0 refills | Status: AC | PRN

## 2017-12-05 NOTE — Addendum Note (Signed)
Addended by: Vivien RotaRIVER, Kolbee Bogusz H on: 1/3/08653/01/2018 02:14 PM   Modules accepted: Orders

## 2017-12-05 NOTE — Patient Instructions (Addendum)
1.  To see if these spells are migraine, we will treat to prevent migraine.  Start divalproex (Depakote) 500mg  at bedtime.  We will check CBC, CMP and TSH.  Contact me in 4 weeks and we can increase dose if needed. 2.  We will repeat MRI of brain with and without contrast as well as MRI of cervical spine with and without contrast in 6 weeks (mid-April) 3.  I refilled the ondansetron 4mg  for nausea 4.  Follow up in 2 months.

## 2017-12-05 NOTE — Progress Notes (Signed)
NEUROLOGY CONSULTATION NOTE  Casey Weber MRN: [id number] DOB: [birth year]  Referring provider: Dr. Danise Mina Primary care provider: Dr. Malvin Johns  Reason for consult:  Migraine, visual disturbance  HISTORY OF PRESENT ILLNESS: Casey Weber is a 67 year old right-handed female with anxiety, thyroid disease, migraines who presents for visual changes and migraine.  She is accompanied by her husband who supplements history.  She has longstanding history of chronic daily migraines since she was in her 68s.  They were bitemporal and occipital pounding headache associated with nausea and vomiting but no visual aura.  She was tried multiple preventatives, such as topiramate, zonisamide, propranolol, and Botox.  She failed Imitrex, tramadol, NSAIDs.  She responded to occipital nerve blocks.  She underwent spinal fusion at C4-5 and C6-7 in May 2018 and daily migraines resolved.   Since 2017, she has had recurrent episodes of visual disturbance.  She sees a dark gray sheet over her eyes that travels from the left side and across her visual field to the right side.  It lasts about 3 minutes.  It is followed by bilateral retro-orbital pain with nausea, vomiting and dizziness.  The Dizziness lasts about 5 minutes but the eye pain and nausea lasts up to 2 to 3 hours.  It occurs at least once a day.  There is no specific trigger and it spontaneously resolves.  It did not improve after the cervical fusion and has even gotten worse.  She has undergone extensive workup: CTA (06/02/17) and MRA (06/13/17) of head were reportedly unremarkable with no evidence of vertebrobasilar insufficiency. Carotid doppler negative for hemodynamically significant stenosis EEG was normal ESR, CRP, B12, folate, RPR, homocysteine and vitamin D were reportedly within normal limits.  She was evaluated by Dr. Danise Mina, neuro-ophthalmologist at [facility].  Humphrey visual field testing and Goldmann visual field testing demonstrated bilateral  visual field constriction, left eye worse than right.  Heidelberg Spectralis SD OCT was normal.  She reportedly had normal ERG and "moderately abnormal PVEP".  MRI of orbits without and with contrast from 10/17/17  showed normal orbits and mild chronic periventricular white matter ischemic changes but also demonstrated "nonspecific linear right frontal cortical enhancement" which "may represent a subacute infarct" and "multifocal symmetric thin dural enhancement".  Current medications:  Amitriptyline 178m at bedtime (for insomnia)  PAST MEDICAL HISTORY: Past Medical History:  Diagnosis Date  . Anxiety   . Osteoporosis   . Stroke ([place]   . Thyroid disease     PAST SURGICAL HISTORY: Past Surgical History:  Procedure Laterality Date  . ABDOMINAL HYSTERECTOMY    . CERVICAL SPINE SURGERY    . FACIAL FRACTURE SURGERY      MEDICATIONS: Current Outpatient Medications on File Prior to Visit  Medication Sig Dispense Refill  . amitriptyline (ELAVIL) 50 MG tablet Take 100 mg by mouth at bedtime.    . cetirizine (ZYRTEC) 10 MG tablet Take 10 mg by mouth daily.    .Marland Kitchendenosumab (PROLIA) 60 MG/ML SOLN injection Inject 60 mg into the skin every 6 (six) months. Administer in upper arm, thigh, or abdomen    . esomeprazole (NEXIUM) 40 MG capsule Take 40 mg by mouth daily at 12 noon.    .Marland Kitchenlevothyroxine (SYNTHROID, LEVOTHROID) 25 MCG tablet Take 25 mcg by mouth daily before breakfast.    . LORazepam (ATIVAN) 1 MG tablet Take 1 mg by mouth daily.    . sertraline (ZOLOFT) 25 MG tablet Take 25 mg by mouth daily.    . simvastatin (ZOCOR)  20 MG tablet Take 20 mg by mouth daily.    . sucralfate (CARAFATE) 1 g tablet Take 1 g by mouth 4 (four) times daily.    Marland Kitchen terbinafine (LAMISIL) 250 MG tablet Take 250 mg by mouth daily.     No current facility-administered medications on file prior to visit.     ALLERGIES: Allergies  Allergen Reactions  . Aspirin Nausea And Vomiting  . Codeine Nausea And Vomiting     FAMILY HISTORY: Family History  Problem Relation Age of Onset  . Heart disease Mother   . Other Father        unknown  . Emphysema Brother   . Heart disease Maternal Grandfather   . Colon cancer Neg Hx   . Liver cancer Neg Hx   . Stomach cancer Neg Hx     SOCIAL HISTORY: Social History   Socioeconomic History  . Marital status: Married    Spouse name: Not on file  . Number of children: 2  . Years of education: Not on file  . Highest education level: Not on file  Social Needs  . Financial resource strain: Not on file  . Food insecurity - worry: Not on file  . Food insecurity - inability: Not on file  . Transportation needs - medical: Not on file  . Transportation needs - non-medical: Not on file  Occupational History  . Occupation: unemployed  Tobacco Use  . Smoking status: Former Research scientist (life sciences)  . Smokeless tobacco: Never Used  Substance and Sexual Activity  . Alcohol use: No    Frequency: Never  . Drug use: No  . Sexual activity: Not on file  Other Topics Concern  . Not on file  Social History Narrative  . Not on file    REVIEW OF SYSTEMS: Constitutional: No fevers, chills, or sweats, no generalized fatigue, change in appetite Eyes: No visual changes, double vision, eye pain Ear, nose and throat: No hearing loss, ear pain, nasal congestion, sore throat Cardiovascular: No chest pain, palpitations Respiratory:  No shortness of breath at rest or with exertion, wheezes GastrointestinaI: No nausea, vomiting, diarrhea, abdominal pain, fecal incontinence Genitourinary:  No dysuria, urinary retention or frequency Musculoskeletal:  No neck pain, back pain Integumentary: No rash, pruritus, skin lesions Neurological: as above Psychiatric: No depression, insomnia, anxiety Endocrine: No palpitations, fatigue, diaphoresis, mood swings, change in appetite, change in weight, increased thirst Hematologic/Lymphatic:  No purpura, petechiae. Allergic/Immunologic: no itchy/runny  eyes, nasal congestion, recent allergic reactions, rashes  PHYSICAL EXAM: Vitals:   12/05/17 1301  BP: 120/60  Pulse: 100  SpO2: 98%   General: No acute distress.  Patient appears well-groomed.  Head:  Normocephalic/atraumatic Eyes:  fundi examined but not visualized Neck: supple, no paraspinal tenderness, full range of motion Back: No paraspinal tenderness Heart: regular rate and rhythm Lungs: Clear to auscultation bilaterally. Vascular: No carotid bruits. Neurological Exam: Mental status: alert and oriented to person, place, and time, recent and remote memory intact, fund of knowledge intact, attention and concentration intact, speech fluent and not dysarthric, language intact. Cranial nerves: CN I: not tested CN II: pupils equal, round and reactive to light, visual fields intact CN III, IV, VI:  full range of motion, no nystagmus, no ptosis CN V: facial sensation intact CN VII: upper and lower face symmetric CN VIII: hearing intact CN IX, X: gag intact, uvula midline CN XI: sternocleidomastoid and trapezius muscles intact CN XII: tongue midline Bulk & Tone: normal, no fasciculations. Motor:  5/5 throughout  Sensation:  temperature and vibration sensation intact. Deep Tendon Reflexes:  2+ throughout, toes downgoing.  Finger to nose testing:  Without dysmetria.  Heel to shin:  Without dysmetria.  Gait:  Normal station and stride.  Able to turn tandem walk cautious and occasionally unsteady. Romberg negative.  IMPRESSION: 1.  Recurrent transient spells of visual disturbance, nausea and dizziness.  Suspect migraine with aura. 2.  Abnormal MRI of brain.  Imaging not available but report describes abnormal dural enhancement.  Unclear etiology.  Findings often seen with intracranial hypotension.  Consider persistent CSF leak from prior cervical spine surgery.  PLAN: 1.  To treat for possible migraine, we will start Depakote ER 532m at bedtime.  Check baseline CBC, CMP and TSH.   Repeat labs in 2 months.  We can increase dose in 4 weeks if needed. 2.  Repeat MRI of brain with and without contrast in 6 weeks.  We will also check MRI of cervical spine with and without contrast to evaluate for any possible CSF leak. 3.    Thank you for allowing me to take part in the care of this patient.  AMetta Clines DO  CC:  KBretta Bang MD  SMerrie Roof MD

## 2017-12-06 LAB — TSH: TSH: 3.13 u[IU]/mL (ref 0.35–4.50)

## 2017-12-11 ENCOUNTER — Telehealth: Payer: Self-pay | Admitting: Neurology

## 2017-12-11 NOTE — Telephone Encounter (Signed)
Please advise on lab work results.

## 2017-12-11 NOTE — Telephone Encounter (Signed)
Blood work overall looks fine.  Sodium level is borderline low but nothing concerning.

## 2017-12-11 NOTE — Telephone Encounter (Signed)
Patient husband wants to know the results of the blood work

## 2017-12-11 NOTE — Telephone Encounter (Signed)
Patient's husband made aware.  

## 2017-12-14 ENCOUNTER — Telehealth: Payer: Self-pay | Admitting: Neurology

## 2017-12-14 NOTE — Telephone Encounter (Signed)
Patient is having some side effects such as sick of stomach and also some numbness arm   CB [phone]

## 2017-12-15 MED ORDER — ZONISAMIDE 100 MG PO CAPS: 100.0000 mg | ORAL_CAPSULE | Freq: Every day | ORAL | 3 refills | Status: DC

## 2017-12-15 NOTE — Telephone Encounter (Signed)
Lmom for patient to call back 

## 2017-12-15 NOTE — Telephone Encounter (Signed)
zonisamide 100mg  at bedtime

## 2017-12-15 NOTE — Telephone Encounter (Signed)
Spoke with patient husband and he states patient has tried Gabapentin 300 mg in the past and claim it didn't work. Is there any else they could try. They are still waiting on MRI of brain and cervical neck.

## 2017-12-15 NOTE — Telephone Encounter (Signed)
Patient husband called stating patient is experiencing diarrhea, upset stomach and is sleep long hours of the day. He states that she has been doing this for several days and he was concern and stopped giving her the med's so that she could have a smoother day. Please advise for alternative patient is not continuing this med unless it is medically necessary.

## 2017-12-15 NOTE — Telephone Encounter (Signed)
Gabapentin 300mg at bedtime 

## 2017-12-15 NOTE — Telephone Encounter (Signed)
Zonisamide 100 mg sent to pharmacy. Spoke with husband and he claims he just going to wait on taking the new medication and give time for patient to recover from her upset stomach. I advise him medication will be at the pharmacy when the patient decides when she will want to take it.

## 2017-12-25 ENCOUNTER — Telehealth: Payer: Self-pay | Admitting: Neurology

## 2017-12-25 NOTE — Telephone Encounter (Signed)
Patient's husband called needing to speak with you regarding patient's Zonisanide medication? He also has some questions regarding the test she has coming up. Please Call. Thanks

## 2017-12-25 NOTE — Telephone Encounter (Addendum)
Called and spoke with Casey Weber and then patient. They were concerned the MRI is this week, but f/u appt here not until May 28th. I assured them we will call with results.

## 2017-12-27 ENCOUNTER — Other Ambulatory Visit (INDEPENDENT_AMBULATORY_CARE_PROVIDER_SITE_OTHER): Payer: Self-pay | Admitting: Family Medicine

## 2017-12-27 ENCOUNTER — Ambulatory Visit
Admission: RE | Admit: 2017-12-27 | Discharge: 2017-12-27 | Disposition: A | Discharge status: Home / Self Care | Payer: Medicare Other | Source: Ambulatory Visit | Attending: Internal Medicine | Admitting: Internal Medicine

## 2017-12-27 DIAGNOSIS — G43109 Migraine with aura, not intractable, without status migrainosus: Secondary | ICD-10-CM

## 2017-12-27 DIAGNOSIS — R9089 Other abnormal findings on diagnostic imaging of central nervous system: Secondary | ICD-10-CM

## 2017-12-27 MED ORDER — GADOBENATE DIMEGLUMINE 529 MG/ML IV SOLN
13.0000 mL | Freq: Once | INTRAVENOUS | Status: AC | PRN
  Administered 2017-12-27: 13 mL via INTRAVENOUS

## 2017-12-28 MED ORDER — FREESTYLE LANCETS MISC
3 refills | Status: DC
Start: 2017-12-28 — End: 2017-12-30

## 2017-12-28 MED ORDER — PANTOPRAZOLE SODIUM 40 MG PO TBEC
40.00 mg | DELAYED_RELEASE_TABLET | Freq: Every day | ORAL | 3 refills | Status: DC
Start: 2017-12-28 — End: 2017-12-30

## 2017-12-30 ENCOUNTER — Encounter (INDEPENDENT_AMBULATORY_CARE_PROVIDER_SITE_OTHER): Payer: Self-pay | Admitting: Family Medicine

## 2018-01-01 ENCOUNTER — Telehealth: Payer: Self-pay

## 2018-01-01 MED ORDER — GABAPENTIN 300 MG PO CAPS: 300.0000 mg | ORAL_CAPSULE | Freq: Every day | ORAL | 3 refills | Status: DC

## 2018-01-01 MED ORDER — PANTOPRAZOLE SODIUM 40 MG PO TBEC
40.00 mg | DELAYED_RELEASE_TABLET | Freq: Every day | ORAL | 3 refills | Status: DC
Start: 2018-01-01 — End: 2019-01-01

## 2018-01-01 MED ORDER — FREESTYLE LANCETS MISC
3 refills | Status: DC
Start: 2018-01-01 — End: 2020-12-16

## 2018-01-01 NOTE — Telephone Encounter (Signed)
Called and spoke with both Pt and husband,  they were on different extensions,advsd them of MRI results  Pt has stopped the zonisamide, called over the weekend, issues with n/v, burning from throat down into stomach. Since stopping zonisamide, symptoms have resolved. Questioned her if she is taking Nexium daily, she states she is, said if she doesn't her GERD is unbearable. Stated she is sensitive to medications. Wants to know what she is to do about pain behind eyes. Her husband also mentioned Pt is no longer taking divalproex.

## 2018-01-01 NOTE — Telephone Encounter (Signed)
Husband rtrnd call, agreed to start gabapentin, advsd him of dosing and the possibility of increase in 1 month, confirmed pharmacy.

## 2018-01-01 NOTE — Telephone Encounter (Signed)
I recommend starting gabapentin 300mg  at bedtime.  We can increase dose in 4 weeks if needed.

## 2018-01-01 NOTE — Telephone Encounter (Signed)
-----   Message from Drema DallasAdam R Jaffe, DO sent at 12/28/2017 12:53 PM EDT ----- MRI of brain no longer shows the reported abnormality on prior imaging.  MRI of cervical spine also does not show anything concerning.  Whatever the finding was on prior imaging is no longer there and is of uncertain significance.

## 2018-01-01 NOTE — Telephone Encounter (Signed)
Called and spoke with Pt, advsd her of gabapentin recommendation. She will discuss with her husband and call back

## 2018-01-01 NOTE — Addendum Note (Signed)
Addended by: Dorthy CoolerBURNS, Blaine Hari J on: 01/01/2018 02:57 PM   Modules accepted: Orders

## 2018-01-08 ENCOUNTER — Ambulatory Visit: Payer: Medicare Other | Admitting: Gastroenterology

## 2018-01-22 ENCOUNTER — Other Ambulatory Visit (INDEPENDENT_AMBULATORY_CARE_PROVIDER_SITE_OTHER): Payer: Self-pay | Admitting: Family Medicine

## 2018-01-22 DIAGNOSIS — Z8639 Personal history of other endocrine, nutritional and metabolic disease: Secondary | ICD-10-CM

## 2018-01-22 MED ORDER — VITAMIN D 1000 UNITS PO TABS
1000.00 [IU] | ORAL_TABLET | Freq: Every day | ORAL | 2 refills | Status: DC
Start: 2018-01-22 — End: 2018-12-18

## 2018-01-22 MED ORDER — CYANOCOBALAMIN 1000 MCG PO TABS
1000.00 ug | ORAL_TABLET | Freq: Every day | ORAL | 2 refills | Status: DC
Start: 2018-01-22 — End: 2019-10-01

## 2018-01-25 ENCOUNTER — Encounter: Payer: Self-pay | Admitting: Neurology

## 2018-02-27 ENCOUNTER — Ambulatory Visit: Payer: Medicare Other | Admitting: Neurology

## 2018-03-14 ENCOUNTER — Other Ambulatory Visit (INDEPENDENT_AMBULATORY_CARE_PROVIDER_SITE_OTHER): Payer: Self-pay | Admitting: Family Medicine

## 2018-03-14 DIAGNOSIS — I10 Essential (primary) hypertension: Secondary | ICD-10-CM

## 2018-03-14 DIAGNOSIS — Z0000 Encounter for general adult medical examination without abnormal findings: Secondary | ICD-10-CM

## 2018-03-17 NOTE — Telephone Encounter (Signed)
Patient is requesting    Current Outpatient Prescriptions on File Prior to Visit   Medication Sig Dispense Refill   . losartan (COZAAR) 25 MG tablet Take 1 tablet (25 mg total) by mouth 2 (two) times daily. 180 tablet 3       To be sent to      [facility] - [place], [place] - [address]  [address]  [place] [address]  Phone: [phone] Fax: [phone]    Lov: 09/18/2017      Patient states she need new script/ it has expired

## 2018-03-18 MED ORDER — LOSARTAN POTASSIUM 25 MG PO TABS
25.00 mg | ORAL_TABLET | Freq: Two times a day (BID) | ORAL | 1 refills | Status: DC
Start: 2018-03-18 — End: 2018-12-18

## 2018-04-18 ENCOUNTER — Ambulatory Visit (INDEPENDENT_AMBULATORY_CARE_PROVIDER_SITE_OTHER): Payer: Medicare Other | Admitting: Family

## 2018-04-18 VITALS — BP 145/83 | HR 70 | Temp 98.9°F | Resp 16 | Ht 64.0 in | Wt 148.0 lb

## 2018-04-18 DIAGNOSIS — J0110 Acute frontal sinusitis, unspecified: Secondary | ICD-10-CM

## 2018-04-18 MED ORDER — AMOXICILLIN-POT CLAVULANATE 875-125 MG PO TABS
1.00 | ORAL_TABLET | Freq: Two times a day (BID) | ORAL | 0 refills | Status: DC
Start: 2018-04-18 — End: 2018-04-18

## 2018-04-18 MED ORDER — AMOXICILLIN-POT CLAVULANATE 875-125 MG PO TABS
1.00 | ORAL_TABLET | Freq: Two times a day (BID) | ORAL | 0 refills | Status: AC
Start: 2018-04-18 — End: 2018-04-28

## 2018-04-18 NOTE — Patient Instructions (Signed)
Thank you for choosing [facility]  Drink plenty of fluid   Flonase  Saline nasal spray   Please follow up with your primary care doctor in 3-4 days.   Return to the clinic or Emergency Department for worsening symptoms.    Please remember to wash your hands it is the best way to reduce illness and the spread of germs        Sinusitis (Antibiotic Treatment)    The sinuses are air-filled spaces within the bones of the face. They connect to the inside of the nose.Sinusitisis an inflammation of the tissue that lines the sinuses. Sinusitis can occur during a cold. It can also happen due to allergies to pollens and other particles in the air. Sinusitis can cause symptoms of sinus congestion and a feeling of fullness. A sinus infection causes fever, headache, and facial pain. There is often green or yellow fluid draining from the nose or into the back of the throat (post-nasal drip). You have been given antibiotics to treat this condition.  Home care   Take the full course of antibiotics as instructed. Do not stop taking them, even when you feel better.   Drink plenty of water, hot tea, and other liquids. This may help thin nasal mucus. It also may help your sinuses drain fluids.   Heat may help soothe painful areas of your face. Use a towel soaked in hot water. Or, stand in the shower and direct the warm spray onto your face. Using a vaporizer along with a menthol rub at night may also help soothe symptoms.   Anexpectorantwith guaifenesin may help thin nasal mucus and help your sinuses drain fluids.   You can use an over-the-counterdecongestant,unless a similar medicine was prescribed to you. Nasal sprays work the fastest. Use one that contains phenylephrine or oxymetazoline. First blow your nose gently. Then use the spray. Do not use these medicines more often than directed on the label. If you do, your symptoms may get worse. You may also take pills that contain pseudoephedrine. Don't use products  that combine multiple medicines. This is because side effects may be increased. Read labels. You can also ask the pharmacist for help. (People with high blood pressure should not use decongestants. They can raise blood pressure.)   Over-the-counterantihistaminesmay help if allergies contributed to your sinusitis.    Do not use nasal rinses or irrigation during an acute sinus infection, unless your healthcare provider tells you to. Rinsing may spread the infection to other areas in your sinuses.   Use acetaminophen or ibuprofen to control pain, unless another pain medicine was prescribed to you. If you have chronic liver or kidney disease or ever had a stomach ulcer, talk with your healthcare provider before using these medicines. (Aspirin should never be taken by anyone under age 18 who is ill with a fever. It may cause severe liver damage.)   Don't smoke. This can make symptoms worse.  Follow-up care  Follow up with your healthcare provider or our staff if you are not better in 1 week.  When to seek medical advice  Call your healthcare provider if any of these occur:   Facial pain or headache that gets worse   Stiff neck   Unusual drowsiness or confusion   Swelling of your forehead or eyelids   Vision problems, such as blurred or double vision   Fever of100.4F (38C)or higher, or as directed by your healthcare provider   Seizure   Breathing problems     Symptoms don't go away in 10 days  Prevention  Here are steps you can take to help prevent an infection:   Keep good hand washing habits.   Don't have close contact with people who have sore throats, colds, or other upper respiratory infections.   Don't smoke, and stay away from secondhand smoke.   Stay up to date with of your vaccines.  Date Last Reviewed: 08/03/2016   2000-2019 [employer] [address], [place] [address]. All rights reserved. This information is not intended as a substitute for professional medical  care. Always follow your healthcare professional's instructions.
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
## 2018-05-29 ENCOUNTER — Ambulatory Visit (INDEPENDENT_AMBULATORY_CARE_PROVIDER_SITE_OTHER): Payer: Medicare Other | Admitting: Family Medicine

## 2018-05-29 ENCOUNTER — Ambulatory Visit (INDEPENDENT_AMBULATORY_CARE_PROVIDER_SITE_OTHER): Payer: Self-pay | Admitting: Family Medicine

## 2018-05-29 ENCOUNTER — Encounter (INDEPENDENT_AMBULATORY_CARE_PROVIDER_SITE_OTHER): Payer: Self-pay | Admitting: Family Medicine

## 2018-05-29 DIAGNOSIS — Z7984 Long term (current) use of oral hypoglycemic drugs: Secondary | ICD-10-CM

## 2018-05-29 DIAGNOSIS — M67441 Ganglion, right hand: Secondary | ICD-10-CM | POA: Insufficient documentation

## 2018-05-29 DIAGNOSIS — N183 Chronic kidney disease, stage 3 unspecified: Secondary | ICD-10-CM

## 2018-05-29 DIAGNOSIS — M6530 Trigger finger, unspecified finger: Secondary | ICD-10-CM | POA: Insufficient documentation

## 2018-05-29 DIAGNOSIS — E119 Type 2 diabetes mellitus without complications: Secondary | ICD-10-CM

## 2018-05-29 HISTORY — DX: Hypercalcemia: E83.52

## 2018-05-29 LAB — HEMOGLOBIN A1C
Average Estimated Glucose: 151.3 mg/dL
Hemoglobin A1C: 6.9 % — ABNORMAL HIGH (ref 4.6–5.9)

## 2018-05-29 LAB — COMPREHENSIVE METABOLIC PANEL
ALT: 11 U/L (ref 0–55)
AST (SGOT): 19 U/L (ref 5–34)
Albumin/Globulin Ratio: 1.7 (ref 0.9–2.2)
Albumin: 4.4 g/dL (ref 3.5–5.0)
Alkaline Phosphatase: 74 U/L (ref 37–106)
BUN: 24 mg/dL — ABNORMAL HIGH (ref 7.0–19.0)
Bilirubin, Total: 0.6 mg/dL (ref 0.2–1.2)
CO2: 26 mEq/L (ref 21–29)
Calcium: 10.2 mg/dL (ref 8.5–10.5)
Chloride: 104 mEq/L (ref 100–111)
Creatinine: 1 mg/dL (ref 0.4–1.5)
Globulin: 2.6 g/dL (ref 2.0–3.7)
Glucose: 131 mg/dL — ABNORMAL HIGH (ref 70–100)
Potassium: 5.3 mEq/L — ABNORMAL HIGH (ref 3.5–5.1)
Protein, Total: 7 g/dL (ref 6.0–8.3)
Sodium: 139 mEq/L (ref 136–145)

## 2018-05-29 LAB — CALCIUM, IONIZED: Calcium, Ionized: 2.59 mEq/L — ABNORMAL HIGH (ref 2.30–2.58)

## 2018-05-29 LAB — VITAMIN D,25 OH,TOTAL: Vitamin D, 25 OH, Total: 52 ng/mL (ref 30–100)

## 2018-05-29 LAB — HEMOLYSIS INDEX: Hemolysis Index: 13 (ref 0–18)

## 2018-05-29 LAB — PTH, INTACT: PTH Intact: 60.4 pg/mL (ref 9.0–72.0)

## 2018-05-29 LAB — GFR: EGFR: 55.2

## 2018-05-29 NOTE — Progress Notes (Signed)
Chief Complaint   Patient presents with   . Hand Pain     Pt c/o stiffness, pain and difficulty moving right ring finger   . Abnormal Lab     Pt states her nephrologist asked her to f/u with pcp after abnromally high calcium.          S:      Problem   Hypercalcemia    Noted recently with renal labs given hx of renal disease.  Stopped vitamin d and calcium supplements being taken at that time about 1 month ago.  No hx renal stones, no psychosis, no abdominal pain.  Has occurred prior with normal ionized calcium on review.       Trigger Finger of Right Hand, Unspecified Finger    Noted on right ring finger.  No pain.  Tried massage without benefit.  Avoids NSAIDs due to hx of renal disease.       Ganglion Cyst of Finger of Right Hand    Noted on right ring thumb dorsum about 10 days prior.  Sometimes tender but  no pain.  Tried massage without benefit.  Avoids NSAIDs due to hx of renal disease.     Type 2 Diabetes Mellitus Without Complication, Without Long-Term Current Use of Insulin    Hx gestational diabetes.  Last eye exam about 2015.  On cozaar.  On statin.  Avoids ASA due to PAN.  Last HgBA1c in 05/2015.  05/2018:  No medication issues.  Due for A1c.         Allergies   Allergen Reactions   . Nsaids      Pt states this causes her bleeding       Current Outpatient Prescriptions   Medication Sig Dispense Refill   . atorvastatin (LIPITOR) 40 MG tablet Take 1 tablet (40 mg total) by mouth daily. 90 tablet 3   . cyanocobalamin ([facility] VITAMIN B12) 1000 MCG tablet Take 1 tablet (1,000 mcg total) by mouth daily 90 tablet 2   . estradiol 10 MCG Tab Place intravaginally twice weekly 24 tablet 3   . fluticasone (FLONASE) 50 MCG/ACT nasal spray 2 sprays by Nasal route daily. 3 Bottle 3   . glucose blood (FREESTYLE LITE) test strip Pt uses freestyle freedom lyte test strips testing BID prn 300 each 3   . Lancets (FREESTYLE) lancets Use twice daily as needed 300 each 3   . lidocaine (LIDODERM) 5 % Place 1 patch onto affected  skin every twelve hours as needed for discomfort.  Remove each patch after 12 hours of use 30 patch 2   . losartan (COZAAR) 25 MG tablet Take 1 tablet (25 mg total) by mouth 2 (two) times daily (Patient taking differently: Take 25 mg by mouth daily    ) 180 tablet 1   . metFORMIN (GLUCOPHAGE XR) 500 MG 24 hr tablet Take 1 tablet (500 mg total) by mouth nightly. 90 tablet 3   . metoprolol succinate XL (TOPROL-XL) 25 MG 24 hr tablet Take 1 tablet (25 mg total) by mouth daily. 90 tablet 3   . pantoprazole (PROTONIX) 40 MG tablet Take 1 tablet (40 mg total) by mouth daily 90 tablet 3   . SITagliptin-MetFORMIN HCl (JANUMET XR) 50-500 MG Tablet SR 24 hr Take 1 tablet by mouth 2 (two) times daily with meals. 180 tablet 3   . vitamin D (CHOLECALCIFEROL) 1000 UNIT tablet Take 1 tablet (1,000 Units total) by mouth daily 90 tablet 2     No current  facility-administered medications for this visit.        Review of Systems   Constitutional: Negative for fever, malaise/fatigue and weight loss.   Respiratory: Negative for cough and shortness of breath.    Cardiovascular: Negative for chest pain and palpitations.   Gastrointestinal: Negative for nausea and vomiting.   Genitourinary: Negative for dysuria.   Musculoskeletal: Positive for joint pain. Negative for myalgias.   Skin: Negative for rash.   Neurological: Negative for sensory change and headaches.   Endo/Heme/Allergies: Negative for polydipsia.   All other systems reviewed and are negative.      All other systems were reviewed and are negative    O:  BP 120/63 (BP Site: Left arm, Patient Position: Sitting, Cuff Size: Medium)   Pulse 74   Temp 97.8 F (36.6 C) (Oral)   Ht 1.626 m (5\' 4" )   Wt 67.5 kg (148 lb 12.8 oz)   BMI 25.54 kg/m , Body mass index is 25.54 kg/m.  Vital signs reviewed  GEN:  NAD, nonobese  NEURO:  CN2-12 without focal defecit, nml gait and station  CVS:  rrr -m/r/g  SKIN:  Warm, dry  EXT:  No edema, right palmar hand at base of 4th MCP joint with  nodule and tenderness and hitch with PROM.  Dorsum right thumb with firm but not hard nodule slightly tender c/w ganglion cyst.  +2 radial pulse b/l    A/P:     1. Hypercalcemia  Vitamin D,25 OH, Total    PTH, Intact    Calcium, ionized    Comprehensive metabolic panel   2. Chronic kidney disease, stage III (moderate)  Comprehensive metabolic panel   3. Type 2 diabetes mellitus without complication, without long-term current use of insulin  Hemoglobin A1C   4. Trigger finger of right hand, unspecified finger  Ambulatory referral to Hand Surgery   5. Ganglion cyst of finger of right hand  Ambulatory referral to Hand Surgery         Risk & Benefits of the new medication(s) were explained to the patient (and family) who verbalized understanding & agreed to the treatment plan. Patient (family) encouraged to contact me/clinical staff with any questions/concerns    Raj Janus, MD

## 2018-05-29 NOTE — Progress Notes (Signed)
Have you seen any specialists/other providers since your last visit with us?      Yes       Arm preference verified?     Yes    The patient is due for eye exam, foot exam, shingles vaccine and [facility].

## 2018-05-30 ENCOUNTER — Encounter (INDEPENDENT_AMBULATORY_CARE_PROVIDER_SITE_OTHER): Payer: Self-pay

## 2018-05-30 NOTE — Progress Notes (Signed)
Please call this patient to notify them that her ionized calcium remains high.  She is a bit dehydrated. Her potassium has been elevated but improving.  Her sugar is well controlled.  Given that she is asymptomatic, I would recommend that she recheck the levels in 3 months.  Should they persist being elevated, we should discuss this matter with an endocrinologist.

## 2018-07-19 ENCOUNTER — Encounter (INDEPENDENT_AMBULATORY_CARE_PROVIDER_SITE_OTHER): Payer: Self-pay | Admitting: Family Medicine

## 2018-07-24 ENCOUNTER — Encounter (INDEPENDENT_AMBULATORY_CARE_PROVIDER_SITE_OTHER): Payer: Self-pay | Admitting: Family Medicine

## 2018-07-24 MED ORDER — METFORMIN HCL ER 500 MG PO TB24
500.00 mg | ORAL_TABLET | Freq: Every evening | ORAL | 3 refills | Status: DC
Start: 2018-07-24 — End: 2019-07-24

## 2018-08-22 ENCOUNTER — Encounter (INDEPENDENT_AMBULATORY_CARE_PROVIDER_SITE_OTHER): Payer: Self-pay

## 2018-09-19 ENCOUNTER — Encounter (INDEPENDENT_AMBULATORY_CARE_PROVIDER_SITE_OTHER): Payer: Self-pay

## 2018-09-20 ENCOUNTER — Ambulatory Visit (INDEPENDENT_AMBULATORY_CARE_PROVIDER_SITE_OTHER): Payer: Medicare Other | Admitting: Family Medicine

## 2018-10-03 DIAGNOSIS — C50919 Malignant neoplasm of unspecified site of unspecified female breast: Secondary | ICD-10-CM

## 2018-10-03 HISTORY — DX: Malignant neoplasm of unspecified site of unspecified female breast: C50.919

## 2018-10-09 ENCOUNTER — Encounter (INDEPENDENT_AMBULATORY_CARE_PROVIDER_SITE_OTHER): Payer: Self-pay | Admitting: Family Medicine

## 2018-10-09 ENCOUNTER — Ambulatory Visit (INDEPENDENT_AMBULATORY_CARE_PROVIDER_SITE_OTHER): Payer: Medicare Other | Admitting: Family Medicine

## 2018-10-09 VITALS — BP 131/72 | HR 72 | Temp 97.9°F | Ht 63.39 in | Wt 150.0 lb

## 2018-10-09 DIAGNOSIS — Z0000 Encounter for general adult medical examination without abnormal findings: Secondary | ICD-10-CM

## 2018-10-09 DIAGNOSIS — E119 Type 2 diabetes mellitus without complications: Secondary | ICD-10-CM

## 2018-10-09 DIAGNOSIS — I493 Ventricular premature depolarization: Secondary | ICD-10-CM

## 2018-10-09 DIAGNOSIS — M6530 Trigger finger, unspecified finger: Secondary | ICD-10-CM

## 2018-10-09 DIAGNOSIS — N183 Chronic kidney disease, stage 3 unspecified: Secondary | ICD-10-CM

## 2018-10-09 DIAGNOSIS — M7062 Trochanteric bursitis, left hip: Secondary | ICD-10-CM

## 2018-10-09 DIAGNOSIS — M7502 Adhesive capsulitis of left shoulder: Secondary | ICD-10-CM

## 2018-10-09 DIAGNOSIS — I10 Essential (primary) hypertension: Secondary | ICD-10-CM

## 2018-10-09 DIAGNOSIS — J301 Allergic rhinitis due to pollen: Secondary | ICD-10-CM

## 2018-10-09 DIAGNOSIS — Z1239 Encounter for other screening for malignant neoplasm of breast: Secondary | ICD-10-CM

## 2018-10-09 DIAGNOSIS — Z1159 Encounter for screening for other viral diseases: Secondary | ICD-10-CM

## 2018-10-09 DIAGNOSIS — K219 Gastro-esophageal reflux disease without esophagitis: Secondary | ICD-10-CM

## 2018-10-09 NOTE — Progress Notes (Signed)
Katelyn Caldwell is a 68 y.o. female who presents today for a Medicare Annual Wellness Visit.     Health Risk Assessment     During the past month, how would you rate your general health?:  Very Good  Which of the following tasks can you do without assistance - drive or take the bus alone; shop for groceries or clothes; prepare your own meals; do your own housework/laundry; handle your own finances/pay bills; eat, bathe or get around your home?:  Drive or take the bus alone, Shop for groceries or clothes, Handle your own finances/pay bills, Do your own housework/laundry, Prepare your own meals, Eat, bathe, dress or get around your home  Which of the following problems have you been bothered by in the past month - dizzy when standing up; problems using the phone; feeling tired or fatigued; moderate or severe body pain?: None of these  Do you exercise for about 20 minutes 3 or more days per week?:  Yes  During the past month was someone available to help if you needed and wanted help?  For example, if you felt nervous, lonely, got sick and had to stay in bed, needed someone to talk to, needed help with daily chores or needed help just taking care of yourself.: Yes  Do you always wear a seat belt?: Yes  Do you have any trouble taking medications the way you have been told to take them?: No  Have you been given any information that can help you with keeping track of your medications?: No  Do you have trouble paying for your medications?: No  Have you been given any information that can help you with hazards in your house, such as scatter rugs, furniture, etc?: No  Do you feel unsteady when standing or walking?: No  Do you worry about falling?: No  Have you fallen two or more times in the past year?: No  Did you suffer any injuries from your falls in the past year?: No    Additional Concerns    Patient Care Team:  Raj Janus, MD as PCP - General (Family Medicine)  Miachel Roux, MD as Consulting Physician  (Cardiology)  Luna Kitchens, [place]  Willaim Sheng, MD as Consulting Physician (Interventional Cardiology)    Past Medical History:   Diagnosis Date    Chronic kidney disease     Diabetes mellitus     type 2    Dizziness 09/09/2016    Hyperlipidemia     Hypertension     Palpitations 08/17/2016    PAN (polyarteritis nodosa) 2003    Premature ventricular contractions (PVCs) (VPCs) 09/09/2016    Sleep apnea      Past Surgical History:   Procedure Laterality Date    CESAREAN SECTION      1983    LIFT, ARM (MEDICAL)  2012    Reconstractiv surgery from [facility].    RENAL BIOPSY  2003    SINUS SURGERY  06/2017     Allergies   Allergen Reactions    Nsaids      Pt states this causes her bleeding      Current Outpatient Medications   Medication Sig Dispense Refill    atorvastatin (LIPITOR) 40 MG tablet Take 1 tablet (40 mg total) by mouth daily. 90 tablet 3    cyanocobalamin ([facility]S VITAMIN B12) 1000 MCG tablet Take 1 tablet (1,000 mcg total) by mouth daily 90 tablet 2    estradiol 10 MCG Tab Place intravaginally  twice weekly 24 tablet 3    fluticasone (FLONASE) 50 MCG/ACT nasal spray 2 sprays by Nasal route daily      glucose blood (FREESTYLE LITE) test strip Pt uses freestyle freedom lyte test strips testing BID prn 300 each 3    Lancets (FREESTYLE) lancets Use twice daily as needed 300 each 3    lidocaine (LIDODERM) 5 % Place 1 patch onto affected skin every twelve hours as needed for discomfort.  Remove each patch after 12 hours of use 30 patch 2    losartan (COZAAR) 25 MG tablet Take 1 tablet (25 mg total) by mouth 2 (two) times daily (Patient taking differently: Take 25 mg by mouth daily Once daily 25 mg  ) 180 tablet 1    MAGNESIUM PO Take by mouth      metFORMIN (GLUCOPHAGE XR) 500 MG 24 hr tablet Take 1 tablet (500 mg total) by mouth nightly 90 tablet 3    metoprolol succinate XL (TOPROL-XL) 25 MG 24 hr tablet Take 1 tablet (25 mg total) by mouth daily. 90 tablet 3    pantoprazole  (PROTONIX) 40 MG tablet Take 1 tablet (40 mg total) by mouth daily 90 tablet 3    SITagliptin-MetFORMIN HCl (JANUMET XR) 50-500 MG Tablet SR 24 hr Take 1 tablet by mouth 2 (two) times daily with meals. 180 tablet 3    vitamin D (CHOLECALCIFEROL) 1000 UNIT tablet Take 1 tablet (1,000 Units total) by mouth daily 90 tablet 2     No current facility-administered medications for this visit.       Social History     Tobacco Use    Smoking status: Never Smoker    Smokeless tobacco: Never Used   Substance Use Topics    Alcohol use: Yes     Alcohol/week: 0.0 standard drinks     Comment: 1-2 glass of wine a week.    Drug use: No      Family History   Problem Relation Age of Onset    Diabetes Mother     Thyroid disease Mother     No known problems Father         The following sections were reviewed this encounter by the provider:        Hospitalizations  no hospitalizations within past year    Depression Screening    See related Activity or Flowsheet    Functional Ability    Falls Risk:  home does not have throw rugs, poor lighting or a slippery bath tub or shower  Hearing:  hearing within normal limits  Exercise:  including eliptical and weight lifting, exercises >4x/week and walking  ADL's:   Bathing - independent   Dressing - independent   Mobility - independent   Transfer - independent   Eating - independent}   Toileting - independent   ADL assistance not needed and provided by spouse    Discussion of Advance Directives: Has no Advanced Directive. Form provided.     Assessment    BP 131/72 (BP Site: Left arm, Patient Position: Sitting, Cuff Size: Medium)    Pulse 72    Temp 97.9 F (36.6 C) (Oral)    Ht 1.61 m (5' 3.39")    Wt 68 kg (150 lb)    BMI 26.25 kg/m      Vision Screening (required for IPPE only): Corrected:     L 20/30     R 20/30  Screening EKG (IPPE only): not indicated    Evaluation of  Cognitive Function    Mood/affect: Appropriate  Appearance: neatly groomed, appropriately and adequately  nourished  Family member/caregiver input: Not present    AWV Mini-Cog Result:  > 3 points - negative screen for dementia    Foot Exam: capillary refill of toes intact bilaterally, dorsalis pedis and posterior tibial pulses intact bilaterally, monofilament testing of plantar aspect of first toe and metatarsal joints (10g monofilament) intact bilaterally, no evidence of dependent rubor bilaterally, no fissures, maceration, ulceration bilaterally, normal hair growth bilaterally and normal pigmentation bilaterally    Assessment/Plan    1. Routine general medical examination at a health care facility  - CBC and differential; Future  - Comprehensive metabolic panel; Future  - Lipid panel; Future  - Urinalysis Reflex to Microscopic Exam- Reflex to Culture; Future    2. Adhesive capsulitis of left shoulder    3. Non-seasonal allergic rhinitis due to pollen    4. Chronic kidney disease, stage III (moderate)  - Vitamin D,25 OH, Total; Future  - Calcium, ionized; Future    5. Type 2 diabetes mellitus without complication, without long-term current use of insulin  - Comprehensive metabolic panel; Future  - Urine Microalbumin Random; Future  - Hemoglobin A1C; Future    6. Gastroesophageal reflux disease without esophagitis    7. Essential hypertension    8. Trochanteric bursitis of left hip    9. Premature ventricular contractions (PVCs) (VPCs)  - Comprehensive metabolic panel; Future    10. Hypercalcemia  - Calcium, ionized; Future    11. Trigger finger of right hand, unspecified finger    12. Screening for breast cancer  - Mammo Digital Screening Bilateral W Cad    13. Need for hepatitis C screening test  - Hepatitis C (HCV) antibody, Total; Future      High BMI Follow Up   Encouragement to Exercise    Raj Janus, MD  10/09/2018

## 2018-10-10 ENCOUNTER — Encounter (INDEPENDENT_AMBULATORY_CARE_PROVIDER_SITE_OTHER): Payer: Self-pay

## 2018-10-16 ENCOUNTER — Other Ambulatory Visit: Payer: Self-pay | Admitting: Family Medicine

## 2018-10-18 ENCOUNTER — Encounter (INDEPENDENT_AMBULATORY_CARE_PROVIDER_SITE_OTHER): Payer: Self-pay

## 2018-10-18 ENCOUNTER — Other Ambulatory Visit (INDEPENDENT_AMBULATORY_CARE_PROVIDER_SITE_OTHER): Payer: Self-pay | Admitting: Family Medicine

## 2018-10-18 ENCOUNTER — Encounter (INDEPENDENT_AMBULATORY_CARE_PROVIDER_SITE_OTHER): Payer: Self-pay | Admitting: Family Medicine

## 2018-10-18 DIAGNOSIS — R928 Other abnormal and inconclusive findings on diagnostic imaging of breast: Secondary | ICD-10-CM

## 2018-10-24 ENCOUNTER — Ambulatory Visit (INDEPENDENT_AMBULATORY_CARE_PROVIDER_SITE_OTHER): Payer: Medicare Other | Admitting: Family

## 2018-10-24 ENCOUNTER — Encounter (INDEPENDENT_AMBULATORY_CARE_PROVIDER_SITE_OTHER): Payer: Self-pay

## 2018-10-24 VITALS — BP 144/87 | HR 73 | Temp 98.1°F | Resp 18 | Ht 64.0 in | Wt 146.0 lb

## 2018-10-24 DIAGNOSIS — B3731 Acute candidiasis of vulva and vagina: Secondary | ICD-10-CM

## 2018-10-24 DIAGNOSIS — B373 Candidiasis of vulva and vagina: Secondary | ICD-10-CM

## 2018-10-24 MED ORDER — MICONAZOLE NITRATE 100 MG VA SUPP
100.00 mg | Freq: Every evening | VAGINAL | 0 refills | Status: AC
Start: 2018-10-24 — End: 2018-10-31

## 2018-10-24 NOTE — Patient Instructions (Signed)
Yeast Infection (Candida Vaginal Infection)    You have aCandidavaginal infection. This is also known as a yeast infection. It is most often caused by a type of yeast (fungus) calledCandida.Candidaare normally found in the vagina. But if they increase in number, this can lead to infection and cause symptoms.  Symptoms of a yeast infection can include:   Clumpy or thin, white discharge, which may look like cottage cheese   Itching or burning   Burning with urination  Certain factors can make a yeast infection more likely. These can include:   Taking certain medicines, such as antibiotics or birth control pills   Pregnancy   Diabetes   Weak immune system  A yeast infection is most often treated with antifungal medicine. This may be given as a vaginal cream or pills you take by mouth. Treatment may last for about 1 to 7 days. Women with severe or recurrent infections may need longer courses of treatment.  Home care   If you're prescribed medicine, be sure to use it as directed. Finishallof the medicine, even if your symptoms go away.Note: Don't try to treat yourself using over-the-counter products without talking to your provider first. He or she will let you know if this is a good option for you.   Ask your provider what steps you can take to help reduce your risk of having a yeast infection in the future.    Follow-up care  Follow up with your healthcare provider, or as directed.  When to seek medical advice  Call your healthcare provider right away if:   You have a fever of 100.4F (38C)or higher, or as directed by your provider.   Your symptoms worsen, or they don't go away within a few days of starting treatment.   You have new pain in the lower belly or pelvic region.   You have side effects that bother you or a reaction to the cream or pills you're prescribed.   You or any partners you have sex with have new symptoms, suchas a rash, joint pain, or sores.  [employer] last reviewed this  educational content on 07/03/2016   2000-2019 [employer] [address], [place] [address]. All rights reserved. This information is not intended as a substitute for professional medical care. Always follow your healthcare professional's instructions.

## 2018-10-24 NOTE — Progress Notes (Signed)
[facility]  PROGRESS NOTE     Patient: Katelyn Caldwell   Date: 10/24/2018   MRN: [id number]       Katelyn Caldwell is a 68 y.o. female      HISTORY     Chief Complaint   Patient presents with    Vaginitis     Pt reports having a yeast infection. She tried to use the 3-day monistat for her symptoms but dropped one of the inserts on the floor and was not able to use it.         Patient presents with c/c of vulvar pruritic sensation white cotage cheese appearing discharge from the vagina for the last few days. No Urgency, frequency, and dysuria. No lower abdominal pain or flank pain. No fever, chills and myalgia      Vaginal Itching   The patient's primary symptoms include genital itching. This is a new problem. The current episode started in the past 7 days. The problem occurs 2 to 4 times per day. The problem has been unchanged. Pertinent negatives include no abdominal pain, back pain, chills, dysuria, fever, flank pain, frequency, hematuria, nausea or urgency. She has tried antifungals for the symptoms. The treatment provided mild relief.       Review of Systems   Constitutional: Negative for chills and fever.   HENT: Negative.    Respiratory: Negative.    Cardiovascular: Negative.    Gastrointestinal: Negative.  Negative for abdominal pain and nausea.   Genitourinary: Negative for dysuria, flank pain, frequency, hematuria and urgency.   Musculoskeletal: Negative for back pain.   Neurological: Negative.    Hematological: Negative.    Psychiatric/Behavioral: Negative.        History:  Past Medical History:   Diagnosis Date    Chronic kidney disease     Diabetes mellitus     type 2    Dizziness 09/09/2016    Hyperlipidemia     Hypertension     Palpitations 08/17/2016    PAN (polyarteritis nodosa) 2003    Premature ventricular contractions (PVCs) (VPCs) 09/09/2016    Sleep apnea        Past Surgical History:   Procedure Laterality Date    CESAREAN SECTION      1983    LIFT, ARM (MEDICAL)  2012     Reconstractiv surgery from [facility] accident.    RENAL BIOPSY  2003    SINUS SURGERY  06/2017       Family History   Problem Relation Age of Onset    Diabetes Mother     Thyroid disease Mother     No known problems Father        Social History     Tobacco Use    Smoking status: Never Smoker    Smokeless tobacco: Never Used   Substance Use Topics    Alcohol use: Yes     Alcohol/week: 0.0 standard drinks     Comment: 1-2 glass of wine a week.    Drug use: No       History reviewed.        Current Outpatient Medications:     atorvastatin (LIPITOR) 40 MG tablet, Take 1 tablet (40 mg total) by mouth daily., Disp: 90 tablet, Rfl: 3    clobetasol (TEMOVATE) 0.05 % external solution, Apply topically, Disp: , Rfl:     cyanocobalamin ([facility]S VITAMIN B12) 1000 MCG tablet, Take 1 tablet (1,000 mcg total) by mouth daily, Disp: 90 tablet,  Rfl: 2    estradiol 10 MCG Tab, Place intravaginally twice weekly, Disp: 24 tablet, Rfl: 3    fluticasone (FLONASE) 50 MCG/ACT nasal spray, 2 sprays by Nasal route daily, Disp: , Rfl:     glucose blood (FREESTYLE LITE) test strip, Pt uses freestyle freedom lyte test strips testing BID prn, Disp: 300 each, Rfl: 3    Lancets (FREESTYLE) lancets, Use twice daily as needed, Disp: 300 each, Rfl: 3    lidocaine (LIDODERM) 5 %, Place 1 patch onto affected skin every twelve hours as needed for discomfort.  Remove each patch after 12 hours of use, Disp: 30 patch, Rfl: 2    losartan (COZAAR) 25 MG tablet, Take 1 tablet (25 mg total) by mouth 2 (two) times daily (Patient taking differently: Take 25 mg by mouth daily Once daily 25 mg ), Disp: 180 tablet, Rfl: 1    MAGNESIUM PO, Take by mouth, Disp: , Rfl:     metFORMIN (GLUCOPHAGE XR) 500 MG 24 hr tablet, Take 1 tablet (500 mg total) by mouth nightly, Disp: 90 tablet, Rfl: 3    pantoprazole (PROTONIX) 40 MG tablet, Take 1 tablet (40 mg total) by mouth daily, Disp: 90 tablet, Rfl: 3    SITagliptin-MetFORMIN HCl (JANUMET XR) 50-500 MG Tablet SR  24 hr, Take 1 tablet by mouth 2 (two) times daily with meals., Disp: 180 tablet, Rfl: 3    vitamin D (CHOLECALCIFEROL) 1000 UNIT tablet, Take 1 tablet (1,000 Units total) by mouth daily, Disp: 90 tablet, Rfl: 2    miconazole (MICOTIN) 100 MG vaginal suppository, Place 1 suppository (100 mg total) vaginally nightly for 7 days, Disp: 7 suppository, Rfl: 0    Allergies   Allergen Reactions    Nsaids      Pt states this causes her bleeding       Medications and Allergies reviewed.    PHYSICAL EXAM     Vitals:    10/24/18 1134   BP: 144/87   Pulse: 73   Resp: 18   Temp: 98.1 F (36.7 C)   TempSrc: Oral   SpO2: 98%   Weight: 66.2 kg (146 lb)   Height: 1.626 m (5\' 4" )       Physical Exam   Nursing note and vitals reviewed.  Constitutional: She is oriented to person, place, and time. She appears well-developed and well-nourished.   HENT:   Head: Normocephalic and atraumatic.   Eyes: Conjunctivae and EOM are normal. Pupils are equal, round, and reactive to light.   Neck: Normal range of motion. Neck supple.   Pulmonary/Chest: Effort normal and breath sounds normal.   Abdominal: Soft. Bowel sounds are normal.   Neurological: She is alert and oriented to person, place, and time.   Skin: Skin is warm and dry.         UCC COURSE       Results     ** No results found for the last 24 hours. **            No results found.      Orders Placed This Encounter   Medications    miconazole (MICOTIN) 100 MG vaginal suppository     Sig: Place 1 suppository (100 mg total) vaginally nightly for 7 days     Dispense:  7 suppository     Refill:  0         PROCEDURES     Procedures       ASSESSMENT  Encounter Diagnosis   Name Primary?    Vulvovaginal candidiasis Yes          SSESSMENT    PLAN      Treatment for vulvovaginal candidiasis is given   Encouraged patient to wash all underwear and use hot drier     Discussed results and diagnosis with patient/family.  Reviewed warning signs for worsening condition, as well as, indications  for follow-up with pmd and return to urgent care clinic.   Patient/family expressed understanding of instructions.    No orders of the defined types were placed in this encounter.        An After Visit Summary was printed and given to the patient.      Signed,  Tia Alert, FNP

## 2018-10-25 ENCOUNTER — Other Ambulatory Visit: Payer: Self-pay | Admitting: Family Medicine

## 2018-10-25 ENCOUNTER — Telehealth (INDEPENDENT_AMBULATORY_CARE_PROVIDER_SITE_OTHER): Payer: Self-pay | Admitting: Family Medicine

## 2018-10-25 DIAGNOSIS — R928 Other abnormal and inconclusive findings on diagnostic imaging of breast: Secondary | ICD-10-CM

## 2018-10-25 NOTE — Telephone Encounter (Signed)
[facility] called requesting a Left Breast Stereo Tactic Biopsy order.     Fax # to send Order is [phone]

## 2018-10-26 NOTE — Addendum Note (Signed)
Addended by: Raj Janus on: 10/26/2018 05:19 PM     Modules accepted: Orders

## 2018-11-02 ENCOUNTER — Other Ambulatory Visit (FREE_STANDING_LABORATORY_FACILITY): Payer: Medicare Other

## 2018-11-02 DIAGNOSIS — E119 Type 2 diabetes mellitus without complications: Secondary | ICD-10-CM

## 2018-11-02 DIAGNOSIS — Z1159 Encounter for screening for other viral diseases: Secondary | ICD-10-CM

## 2018-11-02 DIAGNOSIS — N183 Chronic kidney disease, stage 3 unspecified: Secondary | ICD-10-CM

## 2018-11-02 DIAGNOSIS — Z0000 Encounter for general adult medical examination without abnormal findings: Secondary | ICD-10-CM

## 2018-11-02 DIAGNOSIS — I493 Ventricular premature depolarization: Secondary | ICD-10-CM

## 2018-11-02 LAB — CBC AND DIFFERENTIAL
Absolute NRBC: 0 10*3/uL (ref 0.00–0.00)
Basophils Absolute Automated: 0.05 10*3/uL (ref 0.00–0.08)
Basophils Automated: 1 %
Eosinophils Absolute Automated: 0.06 10*3/uL (ref 0.00–0.44)
Eosinophils Automated: 1.1 %
Hematocrit: 42.2 % (ref 34.7–43.7)
Hgb: 13 g/dL (ref 11.4–14.8)
Immature Granulocytes Absolute: 0.02 10*3/uL (ref 0.00–0.07)
Immature Granulocytes: 0.4 %
Lymphocytes Absolute Automated: 1.47 10*3/uL (ref 0.42–3.22)
Lymphocytes Automated: 28.2 %
MCH: 27.4 pg (ref 25.1–33.5)
MCHC: 30.8 g/dL — ABNORMAL LOW (ref 31.5–35.8)
MCV: 89 fL (ref 78.0–96.0)
MPV: 12.2 fL (ref 8.9–12.5)
Monocytes Absolute Automated: 0.44 10*3/uL (ref 0.21–0.85)
Monocytes: 8.4 %
Neutrophils Absolute: 3.18 10*3/uL (ref 1.10–6.33)
Neutrophils: 60.9 %
Nucleated RBC: 0 /100 WBC (ref 0.0–0.0)
Platelets: 149 10*3/uL (ref 142–346)
RBC: 4.74 10*6/uL (ref 3.90–5.10)
RDW: 14 % (ref 11–15)
WBC: 5.22 10*3/uL (ref 3.10–9.50)

## 2018-11-02 LAB — COMPREHENSIVE METABOLIC PANEL
ALT: 9 U/L (ref 0–55)
AST (SGOT): 16 U/L (ref 5–34)
Albumin/Globulin Ratio: 1.5 (ref 0.9–2.2)
Albumin: 4 g/dL (ref 3.5–5.0)
Alkaline Phosphatase: 77 U/L (ref 37–106)
BUN: 26 mg/dL — ABNORMAL HIGH (ref 7.0–19.0)
Bilirubin, Total: 0.5 mg/dL (ref 0.2–1.2)
CO2: 25 mEq/L (ref 21–29)
Calcium: 9.6 mg/dL (ref 8.5–10.5)
Chloride: 106 mEq/L (ref 100–111)
Creatinine: 1.2 mg/dL (ref 0.4–1.5)
Globulin: 2.7 g/dL (ref 2.0–3.7)
Glucose: 130 mg/dL — ABNORMAL HIGH (ref 70–100)
Potassium: 5.2 mEq/L — ABNORMAL HIGH (ref 3.5–5.1)
Protein, Total: 6.7 g/dL (ref 6.0–8.3)
Sodium: 139 mEq/L (ref 136–145)

## 2018-11-02 LAB — URINALYSIS REFLEX TO MICROSCOPIC EXAM - REFLEX TO CULTURE
Bilirubin, UA: NEGATIVE
Blood, UA: NEGATIVE
Glucose, UA: NEGATIVE
Ketones UA: NEGATIVE
Nitrite, UA: POSITIVE — AB
Specific Gravity UA: 1.009 (ref 1.001–1.035)
Urine pH: 6 (ref 5.0–8.0)
Urobilinogen, UA: 0.2 (ref 0.2–2.0)

## 2018-11-02 LAB — HEMOGLOBIN A1C
Average Estimated Glucose: 154.2 mg/dL
Hemoglobin A1C: 7 % — ABNORMAL HIGH (ref 4.6–5.9)

## 2018-11-02 LAB — LIPID PANEL
Cholesterol / HDL Ratio: 3.5
Cholesterol: 179 mg/dL (ref 0–199)
HDL: 51 mg/dL (ref 40–9999)
LDL Calculated: 107 mg/dL — ABNORMAL HIGH (ref 0–99)
Triglycerides: 103 mg/dL (ref 34–149)
VLDL Calculated: 21 mg/dL (ref 10–40)

## 2018-11-02 LAB — HEMOLYSIS INDEX: Hemolysis Index: 6 (ref 0–18)

## 2018-11-02 LAB — GFR: EGFR: 44.7

## 2018-11-02 LAB — MICROALBUMIN, RANDOM URINE
Urine Creatinine, Random: 30 mg/dL
Urine Microalbumin, Random: 129 — ABNORMAL HIGH (ref 0.0–30.0)
Urine Microalbumin/Creatinine Ratio: 430 ug/mg — ABNORMAL HIGH (ref 0–30)

## 2018-11-02 LAB — VITAMIN D,25 OH,TOTAL: Vitamin D, 25 OH, Total: 51 ng/mL (ref 30–100)

## 2018-11-02 LAB — CALCIUM, IONIZED: Calcium, Ionized: 2.67 mEq/L — ABNORMAL HIGH (ref 2.30–2.58)

## 2018-11-02 LAB — HEPATITIS C ANTIBODY: Hepatitis C, AB: NONREACTIVE

## 2018-11-03 ENCOUNTER — Other Ambulatory Visit: Payer: Self-pay | Admitting: Family Medicine

## 2018-11-05 ENCOUNTER — Other Ambulatory Visit (INDEPENDENT_AMBULATORY_CARE_PROVIDER_SITE_OTHER): Payer: Self-pay | Admitting: Family Medicine

## 2018-11-05 DIAGNOSIS — N342 Other urethritis: Secondary | ICD-10-CM

## 2018-11-05 MED ORDER — SULFAMETHOXAZOLE-TRIMETHOPRIM 800-160 MG PO TABS
1.0000 | ORAL_TABLET | Freq: Two times a day (BID) | ORAL | 0 refills | Status: AC
Start: 2018-11-05 — End: 2018-11-15

## 2018-11-05 NOTE — Progress Notes (Signed)
Please call this patient to notify them that she has a UTI and abx will be sent to her pharmacy.  Her calcium is elevated as is her sugar and her potassium.   I would like to discuss their abnormal laboratory results at a follow-up appointment.

## 2018-11-07 ENCOUNTER — Encounter (INDEPENDENT_AMBULATORY_CARE_PROVIDER_SITE_OTHER): Payer: Self-pay

## 2018-11-08 ENCOUNTER — Telehealth (INDEPENDENT_AMBULATORY_CARE_PROVIDER_SITE_OTHER): Payer: Self-pay | Admitting: Family Medicine

## 2018-11-08 ENCOUNTER — Other Ambulatory Visit (INDEPENDENT_AMBULATORY_CARE_PROVIDER_SITE_OTHER): Payer: Self-pay | Admitting: Family Medicine

## 2018-11-08 ENCOUNTER — Other Ambulatory Visit: Payer: Self-pay

## 2018-11-08 DIAGNOSIS — N342 Other urethritis: Secondary | ICD-10-CM

## 2018-11-08 MED ORDER — SUCRALFATE 1 G PO TABS: 1.0000 g | ORAL_TABLET | Freq: Four times a day (QID) | ORAL | 11 refills | Status: DC

## 2018-11-08 MED ORDER — CIPROFLOXACIN HCL 250 MG PO TABS
250.00 mg | ORAL_TABLET | Freq: Two times a day (BID) | ORAL | 0 refills | Status: AC
Start: 2018-11-08 — End: 2018-11-11

## 2018-11-08 NOTE — Telephone Encounter (Signed)
Pharmacist informed. 

## 2018-11-08 NOTE — Telephone Encounter (Signed)
Please contact pharmacy.  Will change patient's bactrim to cipro for the UTI

## 2018-11-08 NOTE — Telephone Encounter (Signed)
Pharmacist Tan from [facility] called stating medication Bactrim DS can increase potassium levels patient is already taking Losartan. He wanted to make sure you are ok to dispense medication.

## 2018-11-08 NOTE — Telephone Encounter (Signed)
Patient came into the office with husband for his appointment and needs refill on Carafate. Prescription sent to pharmacy per Dr. Russella Dar and does not need to be seen for a year.

## 2018-11-09 ENCOUNTER — Encounter (INDEPENDENT_AMBULATORY_CARE_PROVIDER_SITE_OTHER): Payer: Self-pay | Admitting: Family Medicine

## 2018-11-10 ENCOUNTER — Telehealth (INDEPENDENT_AMBULATORY_CARE_PROVIDER_SITE_OTHER): Payer: Self-pay | Admitting: Family Medicine

## 2018-11-10 NOTE — Telephone Encounter (Signed)
Spoke to pt, she states she started taking ciproflocacin on Thursday 11/08/18 and has been taking it for one day and a half now for UTI. Pt states the pharmacist told her to watch out for knee pain with this medication because it may affect your muscles. Pt c/o B/L knee and calf pain since taking this medication, she states she has not had this pain before.    Pt denies the following: Pain when touching the back of the heel (achilles tendon), denies pain when going up the stairs, denies HA, SOB, chest pain, denies N/V or diarrhea, denies numbness and tingling, denies vision changes, denies rash, denies throat swelling.     Consulted with Dr. Jayme Cloud and advised pt to continue dose of cipro since she only has until Monday. Advised that if she develops new symptoms of SOB, chest pain, rash, or throat swelling, seek medical attention at nearest UC. Verbal understanding received.

## 2018-11-12 ENCOUNTER — Encounter (INDEPENDENT_AMBULATORY_CARE_PROVIDER_SITE_OTHER): Payer: Self-pay | Admitting: Family Medicine

## 2018-11-12 ENCOUNTER — Telehealth (INDEPENDENT_AMBULATORY_CARE_PROVIDER_SITE_OTHER): Payer: Self-pay | Admitting: Family Medicine

## 2018-11-12 NOTE — Telephone Encounter (Signed)
Gave her a call.  Will discuss further at next appt

## 2018-11-12 NOTE — Telephone Encounter (Signed)
Pt called, no answer. Left voicemail with instructions that left breast biopsy results are back and she should f/u with Dr. Crista Curb to discuss abnormal results in person. Pt already has appt scheduled for tomorrow. Results scanned under media and placed on Dr. Hildred Laser desk.

## 2018-11-12 NOTE — Telephone Encounter (Signed)
Patient called in ,states is very concern  about her Biopsy results,states needs to know some results notes before her   appointment tomorrow  As she feels very anxious.Please advise .

## 2018-11-12 NOTE — Progress Notes (Signed)
Pt called, no answer. Left voicemail with instructions that left breast biopsy results are back and she should f/u with Dr. Crista Curb to discuss abnormal results in person. Pt already has appt scheduled for tomorrow.

## 2018-11-12 NOTE — Telephone Encounter (Signed)
[facility] breast biopsy came back as cancer.

## 2018-11-13 ENCOUNTER — Encounter (INDEPENDENT_AMBULATORY_CARE_PROVIDER_SITE_OTHER): Payer: Self-pay | Admitting: Family Medicine

## 2018-11-13 ENCOUNTER — Ambulatory Visit (INDEPENDENT_AMBULATORY_CARE_PROVIDER_SITE_OTHER): Payer: Medicare Other | Admitting: Family Medicine

## 2018-11-13 VITALS — BP 114/69 | HR 83 | Temp 97.4°F | Wt 151.4 lb

## 2018-11-13 DIAGNOSIS — N183 Chronic kidney disease, stage 3 unspecified: Secondary | ICD-10-CM

## 2018-11-13 DIAGNOSIS — D0512 Intraductal carcinoma in situ of left breast: Secondary | ICD-10-CM

## 2018-11-13 NOTE — Progress Notes (Signed)
Chief Complaint   Patient presents with    Breast Mass     Abnormal left breast biopsy results         S:      Problem   Ductal Carcinoma in Situ (Dcis) of Left Breast    11/2018:  Pt here with husband to discuss recently diagnosed cancer.  Years prior with calcification in same area of same breast.  No fhx breast CA.  Not a smoker.  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       Chronic Kidney Disease, Stage III (Moderate)    relatively stable CKD stage 3 w/ sCr ranging from 1.0 to 1.3 since 2003.  eGFR= 48.  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    11/2018:  Being followed by nephrology.  Potassium continues to run higher.  On losartan.  Eats a diet rich in potassium.  Hydrates ok but could do better.  1 caffeine daily and 3-5 glasses of wine weekly.             Allergies   Allergen Reactions    Nsaids      Pt states this causes her bleeding       Current Outpatient Medications   Medication Sig Dispense Refill    atorvastatin (LIPITOR) 40 MG tablet Take 1 tablet (40 mg total) by mouth daily. 90 tablet 3    clobetasol (TEMOVATE) 0.05 % external solution Apply topically      cyanocobalamin ([employer] VITAMIN B12) 1000 MCG tablet Take 1 tablet (1,000 mcg total) by mouth daily 90 tablet 2    fluticasone (FLONASE) 50 MCG/ACT nasal spray 2 sprays by Nasal route daily      glucose blood (FREESTYLE LITE) test strip Pt uses freestyle freedom lyte test strips testing BID prn 300 each 3    Lancets (FREESTYLE) lancets Use twice daily as needed 300 each 3    lidocaine (LIDODERM) 5 % Place 1 patch onto affected skin every twelve hours as needed for discomfort.  Remove each patch after 12 hours of use 30 patch 2    losartan (COZAAR) 25 MG tablet Take 1 tablet (25 mg total) by mouth 2 (two) times daily (Patient taking differently: Take 25 mg by mouth daily Once daily 25 mg  ) 180 tablet 1    MAGNESIUM PO Take by mouth       metFORMIN (GLUCOPHAGE XR) 500 MG 24 hr tablet Take 1 tablet (500 mg total) by mouth nightly 90 tablet 3    pantoprazole (PROTONIX) 40 MG tablet Take 1 tablet (40 mg total) by mouth daily 90 tablet 3    SITagliptin-MetFORMIN HCl (JANUMET XR) 50-500 MG Tablet SR 24 hr Take 1 tablet by mouth 2 (two) times daily with meals. 180 tablet 3    sulfamethoxazole-trimethoprim (BACTRIM DS,SEPTRA DS) 800-160 MG per tablet Take 1 tablet by mouth 2 (two) times daily for 10 days 20 tablet 0    vitamin D (CHOLECALCIFEROL) 1000 UNIT tablet Take 1 tablet (1,000 Units total) by mouth daily 90 tablet 2     No current facility-administered medications for this visit.        Review of Systems   Constitutional: Negative for fever, malaise/fatigue and weight loss.   HENT: Negative for sore throat.    Eyes: Negative for blurred vision.   Respiratory: Negative for cough and shortness of breath.    Cardiovascular: Negative for chest  pain and palpitations.   Gastrointestinal: Negative for nausea and vomiting.   Genitourinary: Negative for dysuria.   Musculoskeletal: Negative for myalgias.   Skin: Negative for rash.   Neurological: Negative for sensory change and headaches.   Endo/Heme/Allergies: Negative for polydipsia.   All other systems reviewed and are negative.      All other systems were reviewed and are negative    O:  BP 114/69 (BP Site: Left arm, Patient Position: Sitting, Cuff Size: Medium)    Pulse 83    Temp 97.4 F (36.3 C) (Oral)    Wt 68.7 kg (151 lb 6.4 oz)    BMI 25.99 kg/m , Body mass index is 25.99 kg/m.  Vital signs reviewed  GEN:  NAD, nonobese  NEURO:  CN2-12 without focal defecit, nml gait and station  SKIN:  Warm, dry  EXT:  No edema, +2 radial pulse b/l    A/P:     1. Ductal carcinoma in situ (DCIS) of left breast  Stop po estrogen  Ambulatory referral to Breast Clinic   2. Chronic kidney disease, stage III (moderate)  Hydration improved  Low K diet  miimize caffeine         Risk & Benefits of the new  medication(s) were explained to the patient (and family) who verbalized understanding & agreed to the treatment plan. Patient (family) encouraged to contact me/clinical staff with any questions/concerns    Raj Janus, MD

## 2018-11-14 ENCOUNTER — Encounter (INDEPENDENT_AMBULATORY_CARE_PROVIDER_SITE_OTHER): Payer: Self-pay

## 2018-11-22 ENCOUNTER — Telehealth (HOSPITAL_BASED_OUTPATIENT_CLINIC_OR_DEPARTMENT_OTHER): Payer: Self-pay

## 2018-11-22 NOTE — Telephone Encounter (Signed)
Physician/ Location Preference:  Location Preference: [facility]    Physician Preference: Dr. Henderson [place] (Surgery)    Referral:  Referring Provider Dr. Carlena Sax    Is Referral required per insurance No      History:  Personal Hx of Breast Cancer: No      Personal History of Ovarian Cancer:No      Personal History of any other cancer: No        Family Hx of Breast Cancer :No    Family History of Ovarian Cancer: No        Imaging History:  Mammo:Yes January  0,  2020  [facility])    Breast MRI: No      Breast Ultrasound: No        Biopsy History:  Yes  Stereo 11/03/18  Left breast biopsy 7 years ago - benign    Breast Surgery:  No       Misc Testing History:  Genetic Testing:No      Other:     Are there patient owned records that will be brought to the first appointment?No    Are you using medications for breast Pain- No

## 2018-11-27 ENCOUNTER — Encounter (HOSPITAL_BASED_OUTPATIENT_CLINIC_OR_DEPARTMENT_OTHER): Payer: Self-pay | Admitting: Surgery

## 2018-11-27 ENCOUNTER — Encounter (INDEPENDENT_AMBULATORY_CARE_PROVIDER_SITE_OTHER): Payer: Self-pay | Admitting: Family Medicine

## 2018-11-29 ENCOUNTER — Telehealth (HOSPITAL_BASED_OUTPATIENT_CLINIC_OR_DEPARTMENT_OTHER): Payer: Self-pay

## 2018-11-29 ENCOUNTER — Other Ambulatory Visit (HOSPITAL_BASED_OUTPATIENT_CLINIC_OR_DEPARTMENT_OTHER): Payer: Self-pay | Admitting: Surgery

## 2018-11-29 ENCOUNTER — Encounter (HOSPITAL_BASED_OUTPATIENT_CLINIC_OR_DEPARTMENT_OTHER): Payer: Self-pay

## 2018-11-29 ENCOUNTER — Encounter (HOSPITAL_BASED_OUTPATIENT_CLINIC_OR_DEPARTMENT_OTHER): Payer: Self-pay | Admitting: Surgery

## 2018-11-29 ENCOUNTER — Ambulatory Visit (INDEPENDENT_AMBULATORY_CARE_PROVIDER_SITE_OTHER): Payer: Medicare Other | Admitting: Surgery

## 2018-11-29 VITALS — BP 150/78 | HR 72 | Temp 97.9°F | Ht 64.0 in | Wt 154.6 lb

## 2018-11-29 DIAGNOSIS — D0512 Intraductal carcinoma in situ of left breast: Secondary | ICD-10-CM

## 2018-11-29 DIAGNOSIS — N183 Chronic kidney disease, stage 3 unspecified: Secondary | ICD-10-CM

## 2018-11-29 DIAGNOSIS — K219 Gastro-esophageal reflux disease without esophagitis: Secondary | ICD-10-CM

## 2018-11-29 DIAGNOSIS — E119 Type 2 diabetes mellitus without complications: Secondary | ICD-10-CM

## 2018-11-29 NOTE — Progress Notes (Signed)
[facility] [place] Office  [phone]     Consultation Report    Treatment Team  Ref Prov:  Raj Janus, MD Primary Care Physician:   Raj Janus, MD Radiology facility: 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 Ki-67:  DCIS - not indicated Oncotype Dx:  DCIS , not indicated   Estrogen Receptor: positive 19 % Progesterone Receptor: negative Her-2-neu Receptor: DCIS, not indicated Androgen Receptor;  not done   Staging  Tumor Size:   DCIS cm Nodes: clinically/US negative Systemic Metastases: clinically negative Stage: Stage 0 , Tis N0 M0   Treatment  Modality Date    Surgery   Planned left, partial mastectomy with needle localization    Radiation     Endocrine     Chemotherapy     Biologic     Clinical trial     Chief complaint : New diagnosis of left DCIS  History of Present Illness    [name] is a 68 y.o. 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     Clinically, the patient is asymptomatic.  She denies feeling any palpable masses, areas of thickening, skin changes or nipple discharge.   She underwent a routine screening mammogram which demonstrated new microcalcifications in the 12 o'clock position of the left breast necessitating the following workup. She denied any complaints regarding the biopsy site.    Workup  Study Date Location Results    Screening Mammo 10/16/2018 [facility]  the breast tissue is heterogeneously dense.  She has new microcalcifications in the left superior 12 o'clock position.   Diagnostic Mammo/  Ultrasound 10/25/2018 [facility]  there is an approximately 1 cm group of heterogeneous microcalcifications at approximately the 12 o'clock position in the left posterior breast.  There is a biopsy in the superior breast.   Biopsy 11/03/2018 [facility] Type of Biopsy: Stereotactic Biopsy  Location: LEFT  12:00  Localizing Marker Type: Open Coil Hydromark  Post Biopsy Imaging: Clip at the biopsy site.  Pathology: Ductal carcinoma in situ  Comments: Of note, there is a non-hydro-Mark clip that is both posterior and superior to the open coil hydro-Mark clip.  It is approximately 1.3 x 1.8 cm away.  The open coil hydro-Mark clip is in good position.     All films and reports were reviewed    Review of Systems  A comprehensive review of systems was performed and all were negative with the exceptions : Negative except as below  Review of Systems   Constitutional: Negative for chills, fever and weight loss.   HENT: Positive for congestion and sinus pain. Negative for ear pain, hearing loss, nosebleeds, sore throat and tinnitus.    Eyes: Negative for double vision, pain, discharge and redness.   Respiratory: Negative for wheezing.    Cardiovascular: Negative for chest pain, palpitations and leg swelling.   Gastrointestinal: Positive for diarrhea  and nausea. Negative for abdominal pain, blood in stool, constipation, heartburn and vomiting.   Genitourinary: Negative for dysuria, flank pain and hematuria.   Musculoskeletal: Positive for myalgias and neck pain. Negative for back pain.   Skin: Positive for itching and rash.   Neurological: Positive for headaches. Negative for dizziness, tingling, tremors, seizures and weakness.   Psychiatric/Behavioral: Negative for memory loss. The patient is nervous/anxious.      Past Medical History  The patient   has a past medical  history of Anxiety, Chronic kidney disease, Diabetes mellitus, Dizziness (09/09/2016), Hemorrhagic diathesis (September 2003), Hyperlipidemia, Hypertension, Malignant neoplasm, Palpitations (08/17/2016), PAN (polyarteritis nodosa) (2003), Premature ventricular contractions (PVCs) (VPCs) (09/09/2016), and Sleep apnea.    Past Surgical History  The patient  has a past surgical history that includes Cesarean section; Renal biopsy (2003); LIFT, ARM (MEDICAL) (2012); Sinus surgery (06/2017); Abdominal surgery (1983); Excision Biopsy with Needle Localization (2012); and Bladder suspension (cystoscopy).    Medications  The patient's   Current Outpatient Medications:     atorvastatin (LIPITOR) 40 MG tablet, Take 1 tablet (40 mg total) by mouth daily., Disp: 90 tablet, Rfl: 3    clobetasol (TEMOVATE) 0.05 % external solution, Apply topically, Disp: , Rfl:     cyanocobalamin ([employer] VITAMIN B12) 1000 MCG tablet, Take 1 tablet (1,000 mcg total) by mouth daily, Disp: 90 tablet, Rfl: 2    fluticasone (FLONASE) 50 MCG/ACT nasal spray, 2 sprays by Nasal route daily, Disp: , Rfl:     glucose blood (FREESTYLE LITE) test strip, Pt uses freestyle freedom lyte test strips testing BID prn, Disp: 300 each, Rfl: 3    Lancets (FREESTYLE) lancets, Use twice daily as needed, Disp: 300 each, Rfl: 3    lidocaine (LIDODERM) 5 %, Place 1 patch onto affected skin every twelve hours as needed for discomfort.  Remove each patch after 12 hours of use, Disp: 30 patch, Rfl: 2    losartan (COZAAR) 25 MG tablet, Take 1 tablet (25 mg total) by mouth 2 (two) times daily (Patient taking differently: Take 25 mg by mouth daily Once daily 25 mg ), Disp: 180 tablet, Rfl: 1    MAGNESIUM PO, Take by mouth, Disp: , Rfl:     metFORMIN (GLUCOPHAGE XR) 500 MG 24 hr tablet, Take 1 tablet (500 mg total) by mouth nightly, Disp: 90 tablet, Rfl: 3    pantoprazole (PROTONIX) 40 MG tablet, Take 1 tablet (40 mg total) by mouth daily, Disp: 90 tablet, Rfl: 3     SITagliptin-MetFORMIN HCl (JANUMET XR) 50-500 MG Tablet SR 24 hr, Take 1 tablet by mouth 2 (two) times daily with meals., Disp: 180 tablet, Rfl: 3    vitamin D (CHOLECALCIFEROL) 1000 UNIT tablet, Take 1 tablet (1,000 Units total) by mouth daily, Disp: 90 tablet, Rfl: 2  The patient stopped using vaginal estrogen.  Allergies / sensitivities and intolerance   Nsaids    Social History  The patient  reports that she has never smoked. She has never used smokeless tobacco. She reports current alcohol use of about 3.0 standard drinks of alcohol per week. She reports that she does not use drugs.    Gyn History    Gynecological History   Age of menarche:: 8   Breast pain:: No     Age of menopause:: 76       G:: 1     P:: 1      Age at first delivery:: 50   History of breastfeeding:: No  Bra size:: 34DD   Other breast history:: left breast biopsy 2007, benign     Last mammogram:: 10/25/18   Marital status:: Married     Nipple discharge:: No   Ethnicity:: Caucasian       Family History  The patient's family history includes Diabetes in her mother; No known problems in her father; Thyroid disease in her mother.      Physical Exam     BP 150/78    Pulse 72    Temp 97.9 F (36.6 C) (Oral)    Ht 1.626 m (5\' 4" )    Wt 70.1 kg (154 lb 9.6 oz)    BMI 26.54 kg/m     WDWN female in NAD  HEENT: Eyes- Clear, no scleral icterus,      Ears/Nose- WNL Hearing-WNL      Mouth-WNL  Neck:  No thyromegaly or masses, trachea midline  Chest: Respiratory effort normal   Palpation of the chest wall: No tenderness, asymmetry, or crepitus   Lungs-Clear to ascultation   CV:  RRR without murmurs or gallops, no pedal edema  BJM:  Gait and station normal  Ext:  No CCE,   Upper Extremity Range of Motion: WNL   Skin:  Free of significant ulcers or lesions  Neuro:  Grossly intact, no focal findings, alert and oriented, asks appropriate questions  LN:  No axillary, supraclavicular or cervical adenopathy  Breast:    Symmetry:Symmetric   Ptosis grade  3      Right breast:     Presence:Present    Skin:Skin color, texture, turgor normal. No rashes or lesions.     Nipple:normal    Tissue: Within normal limits     Left breast:     Presence:Present    Skin:Skin color, texture, turgor normal. No rashes or lesions.     Nipple:normal    Tissue: Within normal limits    Physical Exam    Assessment  Patient Active Problem List   Diagnosis    Adhesive capsulitis of shoulder    Allergic rhinitis    Chronic kidney disease, stage III (moderate)    Type 2 diabetes mellitus without complication, without long-term current use of insulin    Gastroesophageal reflux disease without esophagitis    Essential hypertension    Trochanteric bursitis of left hip    Premature ventricular contractions (PVCs) (VPCs)    Hypercalcemia    Trigger finger of right hand, unspecified finger    Ductal carcinoma in situ (DCIS) of left breast      T  This is a 68  y.o. -year-old  postmenopausal female patient who has a new diagnosis of ductal carcinoma in situ of the left breast.   Based on clinical and imaging assessment, the anatomic extent is Tis N0 M0, Stage 0.      I have explained that Ductal Carcinoma In situ is considered a pre-invasive cancer. It is a clonal proliferation of malignant-looking cells in the lining of a breast duct without evidence of spread outside the duct. DCIS is considered the earliest form of breast cancer. If untreated, DCIS may progress to invasive breast cancer. DCIS is highly curable with expected survival rates of 97 to 99 %.    I have also explained the possibility of upstaging to invasive ductal carcinoma on final pathologic analysis of the entire lesion after complete surgical resection. The risk of upstaging varies from 10 - 50 % depending on the characteristics of the tumor, type of initial  biopsy,  aggressiveness of cells and extent of the lesion.  If invasive carcinoma is detected on final pathology, we will reassess the prognosis and treatment  recommendations.     ------ MRI in local staging ------  The patient's mammogram demonstrates dense breasts that may limit the ability to assess the true extent of breast cancer. I have discussed the role of breast MRI in order to define the loco-regional extent of her breast cancer, determine the presence of multicentric disease and evaluate the contralateral breast.  I have explained the higher incidence of false positive findings on MRI and need for MRI biopsy in case of detection of any abnormality on MRI. It is unknown whether small cancers detected on MRI have clinical significance and impact the prognosis or outcomes. We have recommended against MRI considering the details of  her case.    ------ Genetics -------    We have explained that mutations are changes in the genetic code of a gene that affect its function. Inherited gene mutations can be passed on from a parent to a child. Some inherited gene mutations increase breast cancer risk. BRCA1 and BRCA2 are the best-known genes linked to breast cancer. Only 5 - 10 % of patients with breast cancer have a familial mutation as the cause of the cancer.    The patient has no significant risk factors to suspect a familial mutation. Genetic counseling or testing is not indicated.    ------Surgical Candidacy------    The patient is a surgical candidate, but with additional risks.  These include  diabetes mellitus and Other including chronic renal insufficiency and polyarteritis nodosa    Comorbidities include:Other      The risks and complications associated with surgery in general were discussed, including but not limited to bleeding, infection, reaction to anesthesia, deep venous thrombosis, pneumonia, pulmonary embolus and death.     Treatment     I have explained the prognostic characteristics of this tumor based on its biologic characteristics and stage.  I have introduced the concept of a multidisciplinary approach to her treatment, including surgical and  possibly medical radiation treatment. Because DCIS is not invasive, treatments are largely directed to local management.     ------Surgery ------    Amongst the options for surgery, we have discussed breast conservation surgery versus mastectomy and mastectomy with reconstruction.  Breast conservation surgery is used in combination with radiation treatment.  I have explained that in appropriately selected patients these options provide similar outcomes in terms of local-regional control and survival of breast cancer. We take into account the size of the tumor relative to the size of the breast in order to provide the best margins and the best cosmetic outcome.     Ms. Weisel is an excellent candidate for breast conservation. She is interested in this approach.      The role of contralateral prophylactic mastectomy was reviewed, potential benefits include decreased incidence of new contralateral primary breast cancer, cosmetic symmetry and the perceived peace of mind in some circumstances. Contralateral prophylactic mastectomy is not expected to improve survival or significantly impact oncologic outcomes. I have also outlined the downsides of mastectomy such as loss of cutaneous sensation, pain and a higher incidence of complications from surgery.    ----- Technical aspects of surgery and reconstruction options ------  In breast conservation surgery the goal is to remove the tumor with a surrounding rim of normal tissue, usually a minimum 1-2 mm distance from the tumor to the margin of resection. If  the original surgery provides a positive or a narrow margin then consideration is given to a second surgery in order to obtain negative margins. Rarely, a mastectomy may be required (in a subsequent surgery) if the initial surgical procedures do not achieve negative margins. Mild pain, ecchymosis and a gentle scar are common after lumpectomy., I anticipate a moderate volume of breast tissue will be removed with the  lumpectomy and it is likely to  cause distortion of the breast size and shape. I will sculpt and realign the remaining breast tissue to restore a natural appearance to the breast shape.    1. Localization of the clip-  I will do intraoperative localization with ultrasound and then place a localizing Kopan's wire.  The risks include bleeding, infection and need for further surgery.      2. Pain control- We have discussed pain control options including the importance of a surgical bra.  The majority of patients do well with NSAIDS and tylenol based on ERAS concepts.  Will prescribe roxicodone as needed.  I have answered all her questions.     3. Oncoplastic tissue rearrangement- I anticipate a moderate volume of breast tissue will be removed with the lumpectomy and it is likely to  cause distortion of the breast size and shape. I will sculpt and realign the remaining breast tissue to restore a natural appearance to the breast shape.      4. Risks- Mild pain, ecchymosis and an incision related scare are common after lumpectomy.  Additional potential complications include but not exclusively anesthetic complications, infection, scar tissue, clip retention, fluid retention,  bleeding requiring surgical evacuation, DVT/PE, pneumonia, and recurrence.     ----- Lymph node management ------    We reviewed  the significance of loco-regional disease and staging.  Tumor spread to the axillary lymph nodes was discussed; an anatomic drawing was used to clarify this.     I explained the concept of sentinel node biopsy as a technique to identify the lymph nodes most likely to evacuate fluid and cells from the breast. , We recommended against axillary staging because her tumor is non-invasive and the patient is being treated with lumpectomy.     ------ Medical Oncology ------    In the context of her multidisciplinary care, the patient  may benefit from adjuvant medical treatment to decrease the risk of recurrence (systemic and  local). Decisions about medical treatments are based on the biologic characteristics and extent of the cancer.     This DCIS  expresses receptors for estrogen. I have recommended consultation with a medical oncologist for consideration of adjuvant endocrine treatment with either Tamoxifen or an Aromatase inhibitor. This may be done once the full analysis from pathology is available.     ----- Radiotherapy ------    Radiation therapy uses high-energy rays or particles that destroy cancer cells. In DCIS, radiotherapy may decrease the risk of recurrence by approximately half with the addition of adjuvant radiotherapy to the breast conservation surgery.  Selected cases of ductal carcinoma in situ may not experience a significant benefit from radiotherapy. The role of radiotherapy will be better defined once the full analysis from pathology is available in consultation with radiation oncology.  I will have the patient see radiation oncology before surgery    --------Medical Comorbidities----------------  DM  CKD  PAN      PLAN    Summary of Treatment Plan  Schedule for a left partial segmental mastectomy with ultrasound needle localization  Consultation with  medical oncology to discuss the risks and benefits of  adjuvant endocrine therapy  Consultation with radiation oncology to discuss the risks and benefits of radiation therapy  Medical Clearance  I will present her case at our multidisclipinary tumor board    -----Resources------------  *The patient has been given the contact information for "[facility]".  *The patient has been provided with a list of web based information sources including the NCCN guidelines (www.nccn.org) for the treatment of breast cancer.  *The patient also met with the [facility] breast navigator, for additional psychosocial support    PRESURGICAL CHECKLIST    Allergies:   Allergies   Allergen Reactions    Nsaids      Pt states this causes her bleeding       Antibiotics: Ancef 1 gram IV on call        Medical clearance:Vejcik, Lonzo Cloud, MD    DVT Screen:  Patients undergoing breast cancer surgery are at increased risk of developing DVT's and pulmonary embolisms.      Each Risk Factor Represents 1 Point.    This patient has:  Lumpectomy/excisional biopsy/simple mastectomy without reconstruction    Each Risk Factor Represents 2 Points  This patient has:  Age 3-74 years  Malignancy (present or previous)    Each Risk Factor Represents 3 Points  This patient has:  None    Each Risk Factor Represents 5 points  This patient has:  None    Total:  5      Based on this screen, the patient is at Moderate Risk.  We need to weigh the risks and benefits of DVT/PE prophylaxis vs bleeding.    Would recommend:  Total Risk= 0  (<0.1%)       Very low= Early ambulation    Total Risk = 1-2 (0.1%)     Low=  Sequential compression device (SCD)    Total Risk = 3-4 (0.6%) Low/ Moderate= Sequential compression device (SCD)     Total Risk = 5-6 (1.27%) Moderate =Sequential compression device (SCD)    Total Risk = 7-8 (2.5%) High= SCD and Lovenox* (40 mg SQ for 10 days)    Total Risk=>8 (11.3%) High= SCD and Lovenox* ( 40 mg SQ for 10 days)    Greater than 60 minutes was spent on this encounter with > 50% spent face to face with the patient.            Answers for HPI/ROS submitted by the patient on 11/26/2018   Symptoms  Have you ever used birth control pills? If Yes, please indicate with the number of years:: yes, 1972-2000  Night sweats: Yes  fatigue: Yes  Unexpected weight gain: No  Appetite change: No  Frequent thirst: No  trouble swallowing: No  Voice change: No  Metallic or sour taste in mouth: No  difficulty breathing: No  Pain with breathing: No  Chronic cough: No  Bloody sputum: No  Snoring: No  Pain walking: Yes  leg pain: Yes  Pacemaker problems: No  Irregular heartbeat: Yes  Regurgitation: No  Cold intolerance: Yes  Heat intolerance: No  Hot flashes: No  Hair loss: No  Difficulty urinating: No  Urinary frequency:  No  bladder incontinence: No  vaginal bleeding: No  vaginal discharge: No  pelvic pain: No  arthralgias: Yes  Neck stiffness: Yes  muscle weakness: No  Hives: No  Wound: Yes  Skin changes: No  numbness: No  confusion: No  speech difficulty: No  Bruise or  bleeds easily: No  Lymph node problems: No  Depressed mood: No  Feeling agitated: No  Panic attack: No

## 2018-11-29 NOTE — Progress Notes (Signed)
Met with patient and husband post consultation with Dr Cyndi Bender. Pathology done 11/03/18 of left breast    DCIS intermediate to high grade, ER 19% PRnegative 0%    Breast MRInot indicated   Dr Dorian Pod post surgery  Dr Chawla3/11/20 discuss RT  Medical clearance PCP  Nephrology clearance Dr. Alesia Banda [place])  Dr Cyndi Bender 12/20/18, surgical planning finalization    Patient has history of renal insufficiency, will not proceed with breast MRI. Surgical plan at this time is L lumpectomy with U/S NL and margin probe on 12/25/18.Discussed community resources to include services with [facility] and mentorship program with [employer]. Provided reputable websites for further information, to include NCCN and the [place] Breast Cancer Treatment Handbook. Noted we will present her case at tumor board. Encouraged her to contact me directly if she has any further questions. All questions were answered at this time.

## 2018-11-30 ENCOUNTER — Encounter (INDEPENDENT_AMBULATORY_CARE_PROVIDER_SITE_OTHER): Payer: Self-pay | Admitting: Family Medicine

## 2018-12-01 DIAGNOSIS — E119 Type 2 diabetes mellitus without complications: Secondary | ICD-10-CM | POA: Insufficient documentation

## 2018-12-02 ENCOUNTER — Ambulatory Visit
Admission: RE | Admit: 2018-12-02 | Discharge: 2018-12-02 | Disposition: A | Discharge status: Home / Self Care | Payer: Medicare Other | Source: Ambulatory Visit | Attending: Radiation Oncology | Admitting: Radiation Oncology

## 2018-12-02 DIAGNOSIS — Z170 Estrogen receptor positive status [ER+]: Secondary | ICD-10-CM | POA: Insufficient documentation

## 2018-12-02 DIAGNOSIS — D0512 Intraductal carcinoma in situ of left breast: Secondary | ICD-10-CM | POA: Insufficient documentation

## 2018-12-05 ENCOUNTER — Encounter (HOSPITAL_BASED_OUTPATIENT_CLINIC_OR_DEPARTMENT_OTHER): Payer: Self-pay

## 2018-12-09 ENCOUNTER — Encounter (INDEPENDENT_AMBULATORY_CARE_PROVIDER_SITE_OTHER): Payer: Self-pay

## 2018-12-10 ENCOUNTER — Encounter (HOSPITAL_BASED_OUTPATIENT_CLINIC_OR_DEPARTMENT_OTHER): Payer: Self-pay | Admitting: Surgery

## 2018-12-11 ENCOUNTER — Telehealth (HOSPITAL_BASED_OUTPATIENT_CLINIC_OR_DEPARTMENT_OTHER): Payer: Self-pay | Admitting: Surgery

## 2018-12-11 NOTE — Telephone Encounter (Signed)
Left message for patient to call regarding her office visit.  Left call back number.

## 2018-12-12 ENCOUNTER — Encounter (INDEPENDENT_AMBULATORY_CARE_PROVIDER_SITE_OTHER): Payer: Self-pay

## 2018-12-12 ENCOUNTER — Ambulatory Visit: Payer: Self-pay | Admitting: Radiation Oncology

## 2018-12-12 DIAGNOSIS — D0512 Intraductal carcinoma in situ of left breast: Secondary | ICD-10-CM

## 2018-12-12 NOTE — Progress Notes (Signed)
12/12/18 1056   Comfort Alteration   Karnofsky Performance Score 100%-normal, no complaints   Fatigue 1   Pain Score 0   Nutrition Alteration   Weight 67.1 kg (148 lb)   Vital signs   Temp 98.2 F (36.8 C)   Temp Source Oral   Heart Rate 67   Resp Rate 16   BP 156/72

## 2018-12-14 ENCOUNTER — Telehealth: Payer: Medicare Other

## 2018-12-14 ENCOUNTER — Encounter (HOSPITAL_BASED_OUTPATIENT_CLINIC_OR_DEPARTMENT_OTHER): Payer: Self-pay

## 2018-12-16 NOTE — Pre-Procedure Instructions (Addendum)
·   Med clearance with required by surgeon   12/18/2018 - Pre-op visit for EKG and labs with PCP, Dr. Crista Curb   11/29/2018 - Surg Note, H&P   11/13/2018 - LOV with PCP, Dr. Crista Curb, H&P done. In epic   07/13/2018 - LOV with card, Dr. Aneta Mins. Note, EKG, stress test faxed 12/10/2018, in epic.     Per anesthesia's guidelines, CBC, BMP, EKG required d/t CKD; FBS & A1C required d/t DM2   05/23/2019 - EKG in epic   11/02/2018 - CBC, CBC, FBS   11/02/2018 - A1C

## 2018-12-18 ENCOUNTER — Encounter (INDEPENDENT_AMBULATORY_CARE_PROVIDER_SITE_OTHER): Payer: Self-pay | Admitting: Family Medicine

## 2018-12-18 ENCOUNTER — Encounter (HOSPITAL_BASED_OUTPATIENT_CLINIC_OR_DEPARTMENT_OTHER): Payer: Self-pay

## 2018-12-18 ENCOUNTER — Ambulatory Visit (INDEPENDENT_AMBULATORY_CARE_PROVIDER_SITE_OTHER): Payer: Medicare Other | Admitting: Family Medicine

## 2018-12-18 VITALS — BP 125/72 | HR 76 | Temp 98.1°F | Ht 63.39 in | Wt 152.0 lb

## 2018-12-18 DIAGNOSIS — E119 Type 2 diabetes mellitus without complications: Secondary | ICD-10-CM

## 2018-12-18 DIAGNOSIS — Z01818 Encounter for other preprocedural examination: Secondary | ICD-10-CM

## 2018-12-18 DIAGNOSIS — D0512 Intraductal carcinoma in situ of left breast: Secondary | ICD-10-CM

## 2018-12-18 LAB — URINALYSIS REFLEX TO MICROSCOPIC EXAM - REFLEX TO CULTURE
Bilirubin, UA: NEGATIVE
Blood, UA: NEGATIVE
Glucose, UA: NEGATIVE
Ketones UA: NEGATIVE
Nitrite, UA: POSITIVE — AB
Specific Gravity UA: 1.007 (ref 1.001–1.035)
Urine pH: 6 (ref 5.0–8.0)
Urobilinogen, UA: 0.2 (ref 0.2–2.0)

## 2018-12-18 LAB — GFR: EGFR: 49.4

## 2018-12-18 LAB — COMPREHENSIVE METABOLIC PANEL
ALT: 13 U/L (ref 0–55)
AST (SGOT): 19 U/L (ref 5–34)
Albumin/Globulin Ratio: 1.5 (ref 0.9–2.2)
Albumin: 4.3 g/dL (ref 3.5–5.0)
Alkaline Phosphatase: 78 U/L (ref 37–106)
BUN: 27 mg/dL — ABNORMAL HIGH (ref 7.0–19.0)
Bilirubin, Total: 0.5 mg/dL (ref 0.2–1.2)
CO2: 27 mEq/L (ref 21–29)
Calcium: 10.1 mg/dL (ref 8.5–10.5)
Chloride: 101 mEq/L (ref 100–111)
Creatinine: 1.1 mg/dL (ref 0.4–1.5)
Globulin: 2.9 g/dL (ref 2.0–3.7)
Glucose: 82 mg/dL (ref 70–100)
Potassium: 4.3 mEq/L (ref 3.5–5.1)
Protein, Total: 7.2 g/dL (ref 6.0–8.3)
Sodium: 140 mEq/L (ref 136–145)

## 2018-12-18 LAB — CBC AND DIFFERENTIAL
Absolute NRBC: 0 10*3/uL (ref 0.00–0.00)
Basophils Absolute Automated: 0.03 10*3/uL (ref 0.00–0.08)
Basophils Automated: 0.5 %
Eosinophils Absolute Automated: 0.04 10*3/uL (ref 0.00–0.44)
Eosinophils Automated: 0.7 %
Hematocrit: 42.5 % (ref 34.7–43.7)
Hgb: 13.1 g/dL (ref 11.4–14.8)
Immature Granulocytes Absolute: 0.02 10*3/uL (ref 0.00–0.07)
Immature Granulocytes: 0.3 %
Lymphocytes Absolute Automated: 1.68 10*3/uL (ref 0.42–3.22)
Lymphocytes Automated: 28.9 %
MCH: 27.6 pg (ref 25.1–33.5)
MCHC: 30.8 g/dL — ABNORMAL LOW (ref 31.5–35.8)
MCV: 89.7 fL (ref 78.0–96.0)
MPV: 12.5 fL (ref 8.9–12.5)
Monocytes Absolute Automated: 0.45 10*3/uL (ref 0.21–0.85)
Monocytes: 7.7 %
Neutrophils Absolute: 3.6 10*3/uL (ref 1.10–6.33)
Neutrophils: 61.9 %
Nucleated RBC: 0 /100 WBC (ref 0.0–0.0)
Platelets: 170 10*3/uL (ref 142–346)
RBC: 4.74 10*6/uL (ref 3.90–5.10)
RDW: 13 % (ref 11–15)
WBC: 5.82 10*3/uL (ref 3.10–9.50)

## 2018-12-18 LAB — HEMOLYSIS INDEX: Hemolysis Index: 13 (ref 0–18)

## 2018-12-18 LAB — PT/INR
PT INR: 1 (ref 0.9–1.1)
PT: 12.9 s (ref 12.6–15.0)

## 2018-12-18 MED ORDER — GLUCOSE BLOOD VI STRP
ORAL_STRIP | 3 refills | Status: DC
Start: 2018-12-18 — End: 2020-12-16

## 2018-12-18 MED ORDER — GLUCOSE BLOOD VI STRP
ORAL_STRIP | 3 refills | Status: DC
Start: 2018-12-18 — End: 2018-12-18
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
## 2018-12-18 NOTE — Progress Notes (Signed)
Have you seen any specialists/other providers since your last visit with [place]?    Yes, pt states they were unable to do an MRI because of her kidneys and she has opted to do a genome study during the surgery.    Arm preference verified?   Yes, no preference    Health Maintenance Due   Topic Date Due    [facility] CARE PLAN LETTER  [birth year] 02, 1950    Shingrix Vaccine 50+ (1) May 27, 1951    DM OPHTHALMOLOGY EXAM  01/01/2018    Pneumonia Vaccine Age 68+ (2 of 2 - PPSV23) 07/31/2018

## 2018-12-19 ENCOUNTER — Telehealth (INDEPENDENT_AMBULATORY_CARE_PROVIDER_SITE_OTHER): Payer: Self-pay | Admitting: Family Medicine

## 2018-12-19 ENCOUNTER — Telehealth (HOSPITAL_BASED_OUTPATIENT_CLINIC_OR_DEPARTMENT_OTHER): Payer: Self-pay | Admitting: Surgery

## 2018-12-19 NOTE — Telephone Encounter (Signed)
Spoke with Katelyn Caldwell at Dr. Hildred Laser office and will pass my message to the nurse.  I am asking if Dr. Crista Curb would be able to review the finalized labs and provide his surgical clearance. Patient will be seeing [name] tomorrow for her pre-op appointment.

## 2018-12-19 NOTE — Telephone Encounter (Signed)
Specialist calling regarding Mrs. Holford pre op. Please review labs and add an addendum to her visit per their request    Thanks  Maryan Rued

## 2018-12-20 ENCOUNTER — Encounter (HOSPITAL_BASED_OUTPATIENT_CLINIC_OR_DEPARTMENT_OTHER): Payer: Self-pay | Admitting: Surgery

## 2018-12-20 ENCOUNTER — Ambulatory Visit (INDEPENDENT_AMBULATORY_CARE_PROVIDER_SITE_OTHER): Payer: Medicare Other | Admitting: Surgery

## 2018-12-20 VITALS — BP 121/80 | HR 75 | Temp 97.7°F | Ht 63.39 in | Wt 153.8 lb

## 2018-12-20 DIAGNOSIS — D0512 Intraductal carcinoma in situ of left breast: Secondary | ICD-10-CM

## 2018-12-20 NOTE — Progress Notes (Signed)
[facility] [place] [facility]  [phone]     Consultation Report    Treatment Team  Ref Prov:    Primary Care Physician:   Raj Janus, MD Radiology facility: [facility])--[phone] Breast Surgeon : Henderson Baltimore MD  [phone]   Plastic Surgeon none Radiation Onc:  Miguel Dibble MD:  [id number] [facility] [place]); Medical Onc:  Laney Pastor, MD [phone] [facility] [place])      Breast Cancer Comprehensive Care Plan Summary  Date of diagnosis: 11/03/2018 Event : DCIS ECOG Performance: Grade 0  (Fully active) Genetic Testing :not indicated   Presentation : Abnormal mammogram with microcalcifications Side: left Focality :  unifocal Location: radian 12:00   Biologic Tumor characteristics  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 Ki-67:  DCIS - not indicated Oncotype Dx:  DCIS , not indicated   Estrogen Receptor: positive 19 % Progesterone Receptor: negative Her-2-neu Receptor: DCIS, not indicated Androgen Receptor;  not done   Staging  Tumor Size:   DCIS cm Nodes: clinically/US negative Systemic Metastases: clinically negative Stage: Stage 0 , Tis N0 M0   Treatment  Modality Date    Surgery   Planned left, partial mastectomy with needle localization    Radiation     Endocrine     Chemotherapy     Biologic     Clinical trial     Chief complaint : New diagnosis of left DCIS  History of Present Illness    [name] is a 68 y.o. female patient sent by      for evaluation and management of left DCIS.     Clinically, the patient is asymptomatic.  She denies feeling any palpable masses, areas of thickening, skin changes or nipple discharge.   She underwent a routine screening mammogram which demonstrated new microcalcifications in the 12 o'clock position of the left breast necessitating the following workup. She denied any complaints regarding the biopsy site.    Workup  Study Date Location Results   Screening Mammo 10/16/2018 [facility]   the breast tissue is heterogeneously dense.  She has new microcalcifications in the left superior 12 o'clock position.   Diagnostic Mammo/  Ultrasound 10/25/2018 [facility]  there is an approximately 1 cm group of heterogeneous microcalcifications at approximately the 12 o'clock position in the left posterior breast.  There is a biopsy in the superior breast.   Biopsy 11/03/2018 [facility] Type of Biopsy: Stereotactic Biopsy  Location: LEFT  12:00  Localizing Marker Type: Open Coil Hydromark  Post Biopsy Imaging: Clip at the biopsy site.  Pathology: Ductal carcinoma in situ  Comments: Of note, there is a non-hydro-Mark clip that is both posterior and superior to the open coil hydro-Mark clip.  It is approximately 1.3 x 1.8 cm away.  The open coil hydro-Mark clip is in good position.     All films and reports were reviewed.    Patient underwent medical clearance and now presents for definitive surgical planning.  She has no new complaints    Review of Systems  A comprehensive review of systems was performed and all were negative with the exceptions : Negative except as below  Review of Systems   Constitutional: Negative for chills, fever and weight loss.   HENT: Positive for congestion and sinus pain. Negative for ear pain, hearing loss, nosebleeds, sore throat and tinnitus.    Eyes: Negative for double vision, pain, discharge and redness.   Respiratory: Negative for wheezing.  Cardiovascular: Negative for chest pain, palpitations and leg swelling.   Gastrointestinal: Positive for diarrhea and nausea. Negative for abdominal pain, blood in stool, constipation, heartburn and vomiting.   Genitourinary: Negative for dysuria, flank pain and hematuria.   Musculoskeletal: Positive for myalgias and neck pain. Negative for back pain.   Skin: Positive for itching and rash.   Neurological: Positive for headaches. Negative for dizziness, tingling, tremors, seizures and weakness.   Psychiatric/Behavioral: Negative for memory loss. The patient  is nervous/anxious.      Past Medical History  The patient   has a past medical history of Anxiety, Chronic kidney disease, Diabetes mellitus, type 2, Hemorrhagic diathesis (September 2003), Hyperlipidemia, Hypertension, Malignant neoplasm, Palpitations (08/17/2016), PAN (polyarteritis nodosa) (2003), Premature ventricular contractions (PVCs) (VPCs) (09/09/2016), and Sleep apnea.    Past Surgical History  The patient  has a past surgical history that includes Cesarean section; Renal biopsy (2003); LIFT, ARM (MEDICAL) (2012); Sinus surgery (06/2017); Abdominal surgery (1983); Excision Biopsy with Needle Localization (2012); and Bladder suspension (cystoscopy).    Medications  The patient's   Current Outpatient Medications:     atorvastatin (LIPITOR) 40 MG tablet, Take 1 tablet (40 mg total) by mouth daily., Disp: 90 tablet, Rfl: 3    clobetasol (TEMOVATE) 0.05 % external solution, Apply topically, Disp: , Rfl:     cyanocobalamin ([employer]S VITAMIN B12) 1000 MCG tablet, Take 1 tablet (1,000 mcg total) by mouth daily, Disp: 90 tablet, Rfl: 2    fluticasone (FLONASE) 50 MCG/ACT nasal spray, 2 sprays by Nasal route daily, Disp: , Rfl:     glucose blood (FREESTYLE LITE) test strip, Pt uses freestyle freedom lyte test strips testing BID prn, Disp: 300 each, Rfl: 3    Lancets (FREESTYLE) lancets, Use twice daily as needed, Disp: 300 each, Rfl: 3    lidocaine (LIDODERM) 5 %, Place 1 patch onto affected skin every twelve hours as needed for discomfort.  Remove each patch after 12 hours of use, Disp: 30 patch, Rfl: 2    losartan (COZAAR) 25 MG tablet, Take 25 mg by mouth daily, Disp: , Rfl:     MAGNESIUM PO, Take by mouth, Disp: , Rfl:     metFORMIN (GLUCOPHAGE XR) 500 MG 24 hr tablet, Take 1 tablet (500 mg total) by mouth nightly, Disp: 90 tablet, Rfl: 3    metoprolol succinate XL (TOPROL-XL) 50 MG 24 hr tablet, Take 25 mg by mouth daily  , Disp: , Rfl:     pantoprazole (PROTONIX) 40 MG tablet, Take 1 tablet (40 mg  total) by mouth daily, Disp: 90 tablet, Rfl: 3    SITagliptin-MetFORMIN HCl (JANUMET XR) 50-500 MG Tablet SR 24 hr, Take 1 tablet by mouth 2 (two) times daily with meals., Disp: 180 tablet, Rfl: 3    VITAMIN D PO, Take 500 Unit by mouth, Disp: , Rfl:   The patient stopped using vaginal estrogen.  Allergies / sensitivities and intolerance   Ciprofloxacin and Nsaids    Social History  The patient  reports that she has never smoked. She has never used smokeless tobacco. She reports current alcohol use of about 3.0 standard drinks of alcohol per week. She reports that she does not use drugs.    Gyn History    Gynecological History   Age of menarche:: 10   Breast pain:: No     Age of menopause:: 9       G:: 1     P:: 1      Age  at first delivery:: 31   History of breastfeeding:: No     Bra size:: 34DD   Other breast history:: left breast biopsy 2007, benign     Last mammogram:: 10/25/18   Marital status:: Married     Nipple discharge:: No   Ethnicity:: Caucasian       Family History  The patient's family history includes Diabetes in her mother; No known problems in her father; Thyroid disease in her mother.      Physical Exam     BP 121/80    Pulse 75    Temp 97.7 F (36.5 C) (Oral)    Ht 1.61 m (5' 3.39")    Wt 69.8 kg (153 lb 12.8 oz)    BMI 26.91 kg/m     Assessment  Patient Active Problem List   Diagnosis    Adhesive capsulitis of shoulder    Allergic rhinitis    Chronic kidney disease, stage III (moderate)    Type 2 diabetes mellitus without complication, without long-term current use of insulin    Gastroesophageal reflux disease without esophagitis    Essential hypertension    Trochanteric bursitis of left hip    Premature ventricular contractions (PVCs) (VPCs)    Hypercalcemia    Trigger finger of right hand, unspecified finger    Ductal carcinoma in situ (DCIS) of left breast    Diabetes mellitus      T  This is a 68  y.o. -year-old  postmenopausal female patient who has a new diagnosis of ductal  carcinoma in situ of the left breast.   Based on clinical and imaging assessment, the anatomic extent is Tis N0 M0, Stage 0.      At this point, we will continue to plan to move forward with a left partial segmental mastectomy with ultrasound-guided needle localization.  I have answered all of her questions.  The patient seems to understand and would like to proceed in this manner.    PLAN    Summary of Treatment Plan  Schedule for a left partial segmental mastectomy with ultrasound needle localization  Consultation with medical oncology to discuss the risks and benefits of  adjuvant endocrine therapy  Consultation with radiation oncology to discuss the risks and benefits of radiation therapy  Medical Clearance  I will present her case at our multidisclipinary tumor board    -----Resources------------  *The patient has been given the contact information for "[facility] Cancer".  *The patient has been provided with a list of web based information sources including the NCCN guidelines (www.nccn.org) for the treatment of breast cancer.  *The patient also met with the [facility] breast navigator, for additional psychosocial support    PRESURGICAL CHECKLIST    Allergies:   Allergies   Allergen Reactions    Ciprofloxacin      Joint pain    Nsaids      Pt states this causes her bleeding       Antibiotics: Ancef 1 gram IV on call       Medical clearance:Vejcik, Lonzo Cloud, MD    DVT Screen:  Patients undergoing breast cancer surgery are at increased risk of developing DVT's and pulmonary embolisms.      Each Risk Factor Represents 1 Point.    This patient has:  Lumpectomy/excisional biopsy/simple mastectomy without reconstruction    Each Risk Factor Represents 2 Points  This patient has:  Age 51-74 years  Malignancy (present or previous)    Each Risk Factor Represents 3 Points  This patient has:  None    Each Risk Factor Represents 5 points  This patient has:  None    Total:  5      Based on this screen, the patient is at Moderate  Risk.  We need to weigh the risks and benefits of DVT/PE prophylaxis vs bleeding.    Would recommend:  Total Risk= 0  (<0.1%)       Very low= Early ambulation    Total Risk = 1-2 (0.1%)     Low=  Sequential compression device (SCD)    Total Risk = 3-4 (0.6%) Low/ Moderate= Sequential compression device (SCD)     Total Risk = 5-6 (1.27%) Moderate =Sequential compression device (SCD)    Total Risk = 7-8 (2.5%) High= SCD and Lovenox* (40 mg SQ for 10 days)    Total Risk=>8 (11.3%) High= SCD and Lovenox* ( 40 mg SQ for 10 days)    Greater than 60 minutes was spent on this encounter with > 50% spent face to face with the patient.            Answers for HPI/ROS submitted by the patient on 11/26/2018   Symptoms  Have you ever used birth control pills? If Yes, please indicate with the number of years:: yes, 1972-2000  Night sweats: Yes  fatigue: Yes  Unexpected weight gain: No  Appetite change: No  Frequent thirst: No  trouble swallowing: No  Voice change: No  Metallic or sour taste in mouth: No  difficulty breathing: No  Pain with breathing: No  Chronic cough: No  Bloody sputum: No  Snoring: No  Pain walking: Yes  leg pain: Yes  Pacemaker problems: No  Irregular heartbeat: Yes  Regurgitation: No  Cold intolerance: Yes  Heat intolerance: No  Hot flashes: No  Hair loss: No  Difficulty urinating: No  Urinary frequency: No  bladder incontinence: No  vaginal bleeding: No  vaginal discharge: No  pelvic pain: No  arthralgias: Yes  Neck stiffness: Yes  muscle weakness: No  Hives: No  Wound: Yes  Skin changes: No  numbness: No  confusion: No  speech difficulty: No  Bruise or bleeds easily: No  Lymph node problems: No  Depressed mood: No  Feeling agitated: No  Panic attack: No

## 2018-12-20 NOTE — H&P (View-Only) (Signed)
[facility] [place] Office  [phone]     Consultation Report    Treatment Team  Ref Prov:    Primary Care Physician:   Vejcik, Scott M, MD Radiology facility: [facility] (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 Ki-67:  DCIS - not indicated Oncotype Dx:  DCIS , not indicated   Estrogen Receptor: positive 19 % Progesterone Receptor: negative Her-2-neu Receptor: DCIS, not indicated Androgen Receptor;  not done   Staging  Tumor Size:   DCIS cm Nodes: clinically/US negative Systemic Metastases: clinically negative Stage: Stage 0 , Tis N0 M0   Treatment  Modality Date    Surgery   Planned left, partial mastectomy with needle localization    Radiation     Endocrine     Chemotherapy     Biologic     Clinical trial     Chief complaint : New diagnosis of left DCIS  History of Present Illness    Ms Tavie Posner is a 68 y.o. female patient sent by      for evaluation and management of left DCIS.     Clinically, the patient is asymptomatic.  She denies feeling any palpable masses, areas of thickening, skin changes or nipple discharge.   She underwent a routine screening mammogram which demonstrated new microcalcifications in the 12 o'clock position of the left breast necessitating the following workup. She denied any complaints regarding the biopsy site.    Workup  Study Date Location Results   Screening Mammo 10/16/2018 [facility]   the breast tissue is heterogeneously dense.  She has new microcalcifications in the left superior 12 o'clock position.   Diagnostic Mammo/  Ultrasound 10/25/2018 [facility]  there is an approximately 1 cm group of heterogeneous microcalcifications at approximately the 12 o'clock position in the left posterior breast.  There is a biopsy in the superior breast.   Biopsy 11/03/2018 [facility] Type of Biopsy: Stereotactic Biopsy  Location: LEFT  12:00  Localizing Marker Type: Open Coil Hydromark  Post Biopsy Imaging: Clip at the biopsy site.  Pathology: Ductal carcinoma in situ  Comments: Of note, there is a non-hydro-Mark clip that is both posterior and superior to the open coil hydro-Mark clip.  It is approximately 1.3 x 1.8 cm away.  The open coil hydro-Mark clip is in good position.     All films and reports were reviewed.    Patient underwent medical clearance and now presents for definitive surgical planning.  She has no new complaints    Review of Systems  A comprehensive review of systems was performed and all were negative with the exceptions : Negative except as below  Review of Systems   Constitutional: Negative for chills, fever and weight loss.   HENT: Positive for congestion and sinus pain. Negative for ear pain, hearing loss, nosebleeds, sore throat and tinnitus.    Eyes: Negative for double vision, pain, discharge and redness.   Respiratory: Negative for wheezing.      Cardiovascular: Negative for chest pain, palpitations and leg swelling.   Gastrointestinal: Positive for diarrhea and nausea. Negative for abdominal pain, blood in stool, constipation, heartburn and vomiting.   Genitourinary: Negative for dysuria, flank pain and hematuria.   Musculoskeletal: Positive for myalgias and neck pain. Negative for back pain.   Skin: Positive for itching and rash.   Neurological: Positive for headaches. Negative for dizziness, tingling, tremors, seizures and weakness.   Psychiatric/Behavioral: Negative for memory loss. The patient  is nervous/anxious.      Past Medical History  The patient   has a past medical history of Anxiety, Chronic kidney disease, Diabetes mellitus, type 2, Hemorrhagic diathesis (September 2003), Hyperlipidemia, Hypertension, Malignant neoplasm, Palpitations (08/17/2016), PAN (polyarteritis nodosa) (2003), Premature ventricular contractions (PVCs) (VPCs) (09/09/2016), and Sleep apnea.    Past Surgical History  The patient  has a past surgical history that includes Cesarean section; Renal biopsy (2003); LIFT, ARM (MEDICAL) (2012); Sinus surgery (06/2017); Abdominal surgery (1983); Excision Biopsy with Needle Localization (2012); and Bladder suspension (cystoscopy).    Medications  The patient's   Current Outpatient Medications:   •  atorvastatin (LIPITOR) 40 MG tablet, Take 1 tablet (40 mg total) by mouth daily., Disp: 90 tablet, Rfl: 3  •  clobetasol (TEMOVATE) 0.05 % external solution, Apply topically, Disp: , Rfl:   •  cyanocobalamin ([employer] VITAMIN B12) 1000 MCG tablet, Take 1 tablet (1,000 mcg total) by mouth daily, Disp: 90 tablet, Rfl: 2  •  fluticasone (FLONASE) 50 MCG/ACT nasal spray, 2 sprays by Nasal route daily, Disp: , Rfl:   •  glucose blood (FREESTYLE LITE) test strip, Pt uses freestyle freedom lyte test strips testing BID prn, Disp: 300 each, Rfl: 3  •  Lancets (FREESTYLE) lancets, Use twice daily as needed, Disp: 300 each, Rfl: 3  •  lidocaine (LIDODERM) 5 %, Place 1 patch onto affected skin every twelve hours as needed for discomfort.  Remove each patch after 12 hours of use, Disp: 30 patch, Rfl: 2  •  losartan (COZAAR) 25 MG tablet, Take 25 mg by mouth daily, Disp: , Rfl:   •  MAGNESIUM PO, Take by mouth, Disp: , Rfl:   •  metFORMIN (GLUCOPHAGE XR) 500 MG 24 hr tablet, Take 1 tablet (500 mg total) by mouth nightly, Disp: 90 tablet, Rfl: 3  •  metoprolol succinate XL (TOPROL-XL) 50 MG 24 hr tablet, Take 25 mg by mouth daily  , Disp: , Rfl:   •  pantoprazole (PROTONIX) 40 MG tablet, Take 1 tablet (40 mg  total) by mouth daily, Disp: 90 tablet, Rfl: 3  •  SITagliptin-MetFORMIN HCl (JANUMET XR) 50-500 MG Tablet SR 24 hr, Take 1 tablet by mouth 2 (two) times daily with meals., Disp: 180 tablet, Rfl: 3  •  VITAMIN D PO, Take 500 Unit by mouth, Disp: , Rfl:   The patient stopped using vaginal estrogen.  Allergies / sensitivities and intolerance   Ciprofloxacin and Nsaids    Social History  The patient  reports that she has never smoked. She has never used smokeless tobacco. She reports current alcohol use of about 3.0 standard drinks of alcohol per week. She reports that she does not use drugs.    Gyn History    Gynecological History   Age of menarche:: 13   Breast pain:: No     Age of menopause:: 48       G:: 1     P:: 1      Age   at first delivery:: 31   History of breastfeeding:: No     Bra size:: 34DD   Other breast history:: left breast biopsy 2007, benign     Last mammogram:: 10/25/18   Marital status:: Married     Nipple discharge:: No   Ethnicity:: Caucasian       Family History  The patient's family history includes Diabetes in her mother; No known problems in her father; Thyroid disease in her mother.      Physical Exam     BP 121/80    Pulse 75    Temp 97.7 °F (36.5 °C) (Oral)    Ht 1.61 m (5' 3.39")    Wt 69.8 kg (153 lb 12.8 oz)    BMI 26.91 kg/m²     Assessment  Patient Active Problem List   Diagnosis   • Adhesive capsulitis of shoulder   • Allergic rhinitis   • Chronic kidney disease, stage III (moderate)   • Type 2 diabetes mellitus without complication, without long-term current use of insulin   • Gastroesophageal reflux disease without esophagitis   • Essential hypertension   • Trochanteric bursitis of left hip   • Premature ventricular contractions (PVCs) (VPCs)   • Hypercalcemia   • Trigger finger of right hand, unspecified finger   • Ductal carcinoma in situ (DCIS) of left breast   • Diabetes mellitus      T  This is a 68  y.o. -year-old  postmenopausal female patient who has a new diagnosis of ductal  carcinoma in situ of the left breast.   Based on clinical and imaging assessment, the anatomic extent is Tis N0 M0, Stage 0.      At this point, we will continue to plan to move forward with a left partial segmental mastectomy with ultrasound-guided needle localization.  I have answered all of her questions.  The patient seems to understand and would like to proceed in this manner.    PLAN    Summary of Treatment Plan  Schedule for a left partial segmental mastectomy with ultrasound needle localization  Consultation with medical oncology to discuss the risks and benefits of  adjuvant endocrine therapy  Consultation with radiation oncology to discuss the risks and benefits of radiation therapy  Medical Clearance  I will present her case at our multidisclipinary tumor board    -----Resources------------  *The patient has been given the contact information for "[facility]".  *The patient has been provided with a list of web based information sources including the NCCN guidelines (www.nccn.org) for the treatment of breast cancer.  *The patient also met with the IFOH breast navigator, for additional psychosocial support    PRESURGICAL CHECKLIST    Allergies:   Allergies   Allergen Reactions   • Ciprofloxacin      Joint pain   • Nsaids      Pt states this causes her bleeding       Antibiotics: Ancef 1 gram IV on call       Medical clearance:Vejcik, Scott M, MD    DVT Screen:  Patients undergoing breast cancer surgery are at increased risk of developing DVT's and pulmonary embolisms.      Each Risk Factor Represents 1 Point.    This patient has:  Lumpectomy/excisional biopsy/simple mastectomy without reconstruction    Each Risk Factor Represents 2 Points  This patient has:  Age 61-74 years  Malignancy (present or previous)    Each Risk Factor Represents 3 Points    This patient has:  None    Each Risk Factor Represents 5 points  This patient has:  None    Total:  5      Based on this screen, the patient is at Moderate  Risk.  We need to weigh the risks and benefits of DVT/PE prophylaxis vs bleeding.    Would recommend:  Total Risk= 0  (<0.1%)       Very low= Early ambulation    Total Risk = 1-2 (0.1%)     Low=  Sequential compression device (SCD)    Total Risk = 3-4 (0.6%) Low/ Moderate= Sequential compression device (SCD)     Total Risk = 5-6 (1.27%) Moderate =Sequential compression device (SCD)    Total Risk = 7-8 (2.5%) High= SCD and Lovenox* (40 mg SQ for 10 days)    Total Risk=>8 (11.3%) High= SCD and Lovenox* ( 40 mg SQ for 10 days)    Greater than 60 minutes was spent on this encounter with > 50% spent face to face with the patient.            Answers for HPI/ROS submitted by the patient on 11/26/2018   Symptoms  Have you ever used birth control pills? If Yes, please indicate with the number of years:: yes, 1972-2000  Night sweats: Yes  fatigue: Yes  Unexpected weight gain: No  Appetite change: No  Frequent thirst: No  trouble swallowing: No  Voice change: No  Metallic or sour taste in mouth: No  difficulty breathing: No  Pain with breathing: No  Chronic cough: No  Bloody sputum: No  Snoring: No  Pain walking: Yes  leg pain: Yes  Pacemaker problems: No  Irregular heartbeat: Yes  Regurgitation: No  Cold intolerance: Yes  Heat intolerance: No  Hot flashes: No  Hair loss: No  Difficulty urinating: No  Urinary frequency: No  bladder incontinence: No  vaginal bleeding: No  vaginal discharge: No  pelvic pain: No  arthralgias: Yes  Neck stiffness: Yes  muscle weakness: No  Hives: No  Wound: Yes  Skin changes: No  numbness: No  confusion: No  speech difficulty: No  Bruise or bleeds easily: No  Lymph node problems: No  Depressed mood: No  Feeling agitated: No  Panic attack: No

## 2018-12-24 ENCOUNTER — Telehealth (HOSPITAL_BASED_OUTPATIENT_CLINIC_OR_DEPARTMENT_OTHER): Payer: Self-pay

## 2018-12-24 ENCOUNTER — Telehealth (INDEPENDENT_AMBULATORY_CARE_PROVIDER_SITE_OTHER): Payer: Self-pay | Admitting: Family Medicine

## 2018-12-24 ENCOUNTER — Other Ambulatory Visit (HOSPITAL_BASED_OUTPATIENT_CLINIC_OR_DEPARTMENT_OTHER): Payer: Self-pay | Admitting: Family Nurse Practitioner

## 2018-12-24 DIAGNOSIS — N3090 Cystitis, unspecified without hematuria: Secondary | ICD-10-CM

## 2018-12-24 MED ORDER — SULFAMETHOXAZOLE-TRIMETHOPRIM 800-160 MG PO TABS
1.0000 | ORAL_TABLET | Freq: Once | ORAL | 0 refills | Status: AC
Start: 2018-12-24 — End: 2018-12-25

## 2018-12-24 NOTE — Progress Notes (Signed)
Bactrim one time dose sent into patient's pharmacy due to possible UTI, surgery tomorrow, per [name] recommendation.

## 2018-12-24 NOTE — Telephone Encounter (Signed)
Pt is having cancer surgery tomorrow and she saw on Mychart that he UA came back with possible UTI and wants to know what she needs to do or if she can still have the Surgery. Best number to reach her at is [phone]

## 2018-12-25 ENCOUNTER — Ambulatory Visit
Admission: RE | Admit: 2018-12-25 | Discharge: 2018-12-25 | Disposition: A | Discharge status: Home / Self Care | Payer: Medicare Other | Source: Ambulatory Visit | Attending: Surgery | Admitting: Surgery

## 2018-12-25 ENCOUNTER — Encounter (HOSPITAL_BASED_OUTPATIENT_CLINIC_OR_DEPARTMENT_OTHER): Payer: Self-pay

## 2018-12-25 ENCOUNTER — Encounter (HOSPITAL_BASED_OUTPATIENT_CLINIC_OR_DEPARTMENT_OTHER)
Admission: RE | Disposition: A | Discharge status: Home / Self Care | Payer: Self-pay | Source: Ambulatory Visit | Attending: Surgery

## 2018-12-25 ENCOUNTER — Ambulatory Visit (HOSPITAL_BASED_OUTPATIENT_CLINIC_OR_DEPARTMENT_OTHER): Payer: Medicare Other | Admitting: Radiology

## 2018-12-25 ENCOUNTER — Ambulatory Visit (HOSPITAL_BASED_OUTPATIENT_CLINIC_OR_DEPARTMENT_OTHER): Payer: Medicare Other | Admitting: Anesthesiology

## 2018-12-25 ENCOUNTER — Ambulatory Visit: Payer: Self-pay

## 2018-12-25 ENCOUNTER — Ambulatory Visit: Payer: Medicare Other

## 2018-12-25 DIAGNOSIS — C50812 Malignant neoplasm of overlapping sites of left female breast: Secondary | ICD-10-CM | POA: Insufficient documentation

## 2018-12-25 DIAGNOSIS — N183 Chronic kidney disease, stage 3 (moderate): Secondary | ICD-10-CM | POA: Insufficient documentation

## 2018-12-25 DIAGNOSIS — G4730 Sleep apnea, unspecified: Secondary | ICD-10-CM | POA: Insufficient documentation

## 2018-12-25 DIAGNOSIS — I493 Ventricular premature depolarization: Secondary | ICD-10-CM | POA: Insufficient documentation

## 2018-12-25 DIAGNOSIS — E785 Hyperlipidemia, unspecified: Secondary | ICD-10-CM | POA: Insufficient documentation

## 2018-12-25 DIAGNOSIS — E1122 Type 2 diabetes mellitus with diabetic chronic kidney disease: Secondary | ICD-10-CM | POA: Insufficient documentation

## 2018-12-25 DIAGNOSIS — D0512 Intraductal carcinoma in situ of left breast: Secondary | ICD-10-CM

## 2018-12-25 DIAGNOSIS — I129 Hypertensive chronic kidney disease with stage 1 through stage 4 chronic kidney disease, or unspecified chronic kidney disease: Secondary | ICD-10-CM | POA: Insufficient documentation

## 2018-12-25 DIAGNOSIS — Z170 Estrogen receptor positive status [ER+]: Secondary | ICD-10-CM

## 2018-12-25 DIAGNOSIS — Z7984 Long term (current) use of oral hypoglycemic drugs: Secondary | ICD-10-CM | POA: Insufficient documentation

## 2018-12-25 HISTORY — PX: ASSESSMENT, MARGIN, INTRAOPERATIVE, RADIOFREQUENCY SPECTROSCOPY: SHX6144

## 2018-12-25 HISTORY — PX: BIOPSY, BREAST, TUMOR EXCISION, ULTRASOUND NEEDLE LOCALIZATION: SHX3236

## 2018-12-25 HISTORY — PX: BREAST, LUMPECTOMY: SHX3314

## 2018-12-25 LAB — GLUCOSE WHOLE BLOOD - POCT
Whole Blood Glucose POCT: 146 mg/dL — ABNORMAL HIGH (ref 70–100)
Whole Blood Glucose POCT: 114 mg/dL — ABNORMAL HIGH (ref 70–100)

## 2018-12-25 SURGERY — BREAST, LUMPECTOMY
Anesthesia: Anesthesia General | Site: Breast | Laterality: Left | Wound class: Clean

## 2018-12-25 MED ORDER — LACTATED RINGERS IV SOLN
100.0000 mL/h | INTRAVENOUS | Status: DC
Start: 2018-12-25 — End: 2018-12-25

## 2018-12-25 MED ORDER — ONDANSETRON HCL 4 MG/2ML IJ SOLN
4.0000 mg | Freq: Once | INTRAMUSCULAR | Status: DC | PRN
Start: 2018-12-25 — End: 2018-12-25

## 2018-12-25 MED ORDER — LIDOCAINE HCL 1 % IJ SOLN
INTRAMUSCULAR | Status: DC | PRN
Start: 2018-12-25 — End: 2018-12-25
  Administered 2018-12-25: 10 mL

## 2018-12-25 MED ORDER — ONDANSETRON HCL 4 MG/2ML IJ SOLN
INTRAMUSCULAR | Status: AC
Start: 2018-12-25 — End: ?
  Filled 2018-12-25: qty 2

## 2018-12-25 MED ORDER — LIDOCAINE HCL (PF) 2 % IJ SOLN
INTRAMUSCULAR | Status: AC
Start: 2018-12-25 — End: ?
  Filled 2018-12-25: qty 5

## 2018-12-25 MED ORDER — BACITRACIN 50000 UNITS IM SOLR
INTRAMUSCULAR | Status: AC
Start: 2018-12-25 — End: ?
  Filled 2018-12-25: qty 50000

## 2018-12-25 MED ORDER — CEFAZOLIN SODIUM-DEXTROSE 2-3 GM-%(50ML) IV SOLR
2.0000 g | Freq: Once | INTRAVENOUS | Status: AC
Start: 2018-12-25 — End: 2018-12-25
  Administered 2018-12-25: 11:00:00 2 g via INTRAVENOUS

## 2018-12-25 MED ORDER — BUPIVACAINE HCL (PF) 0.25 % IJ SOLN
INTRAMUSCULAR | Status: AC
Start: 2018-12-25 — End: ?
  Filled 2018-12-25: qty 10

## 2018-12-25 MED ORDER — CEFAZOLIN SODIUM-DEXTROSE 2-3 GM-%(50ML) IV SOLR
INTRAVENOUS | Status: AC
Start: 2018-12-25 — End: ?
  Filled 2018-12-25: qty 50

## 2018-12-25 MED ORDER — PROPOFOL INFUSION 10 MG/ML
INTRAVENOUS | Status: DC | PRN
Start: 2018-12-25 — End: 2018-12-25
  Administered 2018-12-25: 150 mg via INTRAVENOUS

## 2018-12-25 MED ORDER — MIDAZOLAM HCL 1 MG/ML IJ SOLN (WRAP)
INTRAMUSCULAR | Status: DC | PRN
Start: 2018-12-25 — End: 2018-12-25
  Administered 2018-12-25: 2 mg via INTRAVENOUS

## 2018-12-25 MED ORDER — ACETAMINOPHEN 500 MG PO TABS
1000.0000 mg | ORAL_TABLET | Freq: Once | ORAL | Status: AC
Start: 2018-12-25 — End: 2018-12-25
  Administered 2018-12-25: 10:00:00 1000 mg via ORAL

## 2018-12-25 MED ORDER — PROPOFOL INFUSION 10 MG/ML
INTRAVENOUS | Status: DC | PRN
Start: 2018-12-25 — End: 2018-12-25
  Administered 2018-12-25: 200 ug/kg/min via INTRAVENOUS

## 2018-12-25 MED ORDER — BUPIVACAINE-EPINEPHRINE (PF) 0.5% -1:200000 IJ SOLN
INTRAMUSCULAR | Status: DC | PRN
Start: 2018-12-25 — End: 2018-12-25
  Administered 2018-12-25: 10 mL

## 2018-12-25 MED ORDER — MIDAZOLAM HCL 1 MG/ML IJ SOLN (WRAP)
INTRAMUSCULAR | Status: AC
Start: 2018-12-25 — End: ?
  Filled 2018-12-25: qty 2

## 2018-12-25 MED ORDER — FAMOTIDINE 20 MG/2ML IV SOLN
INTRAVENOUS | Status: AC
Start: 2018-12-25 — End: ?
  Filled 2018-12-25: qty 2

## 2018-12-25 MED ORDER — FENTANYL CITRATE (PF) 50 MCG/ML IJ SOLN (WRAP)
INTRAMUSCULAR | Status: DC | PRN
Start: 2018-12-25 — End: 2018-12-25
  Administered 2018-12-25: 100 ug via INTRAVENOUS

## 2018-12-25 MED ORDER — LIDOCAINE HCL (PF) 1 % IJ SOLN
INTRAMUSCULAR | Status: AC
Start: 2018-12-25 — End: ?
  Filled 2018-12-25: qty 10

## 2018-12-25 MED ORDER — PROPOFOL 10 MG/ML IV EMUL (WRAP)
INTRAVENOUS | Status: AC
Start: 2018-12-25 — End: ?
  Filled 2018-12-25: qty 20

## 2018-12-25 MED ORDER — ONDANSETRON HCL 4 MG/2ML IJ SOLN
INTRAMUSCULAR | Status: DC | PRN
Start: 2018-12-25 — End: 2018-12-25
  Administered 2018-12-25: 4 mg via INTRAVENOUS

## 2018-12-25 MED ORDER — LACTATED RINGERS IV SOLN
INTRAVENOUS | Status: DC
Start: 2018-12-25 — End: 2018-12-25

## 2018-12-25 MED ORDER — BACITRACIN 50000 UNITS IM SOLR
INTRAMUSCULAR | Status: DC | PRN
Start: 2018-12-25 — End: 2018-12-25
  Administered 2018-12-25: 50000 [IU]

## 2018-12-25 MED ORDER — FENTANYL CITRATE (PF) 50 MCG/ML IJ SOLN (WRAP)
INTRAMUSCULAR | Status: AC
Start: 2018-12-25 — End: ?
  Filled 2018-12-25: qty 2

## 2018-12-25 MED ORDER — EPHEDRINE SULFATE 50 MG/ML IJ/IV SOLN (WRAP)
Status: AC
Start: 2018-12-25 — End: ?
  Filled 2018-12-25: qty 1

## 2018-12-25 MED ORDER — PROPOFOL 10 MG/ML IV EMUL (WRAP)
INTRAVENOUS | Status: AC
Start: 2018-12-25 — End: ?
  Filled 2018-12-25: qty 100

## 2018-12-25 MED ORDER — EPHEDRINE SULFATE 50 MG/ML IJ/IV SOLN (WRAP)
Status: DC | PRN
Start: 2018-12-25 — End: 2018-12-25
  Administered 2018-12-25: 5 mg via INTRAVENOUS

## 2018-12-25 MED ORDER — LACTATED RINGERS IV BOLUS
500.0000 mL | Freq: Once | INTRAVENOUS | Status: DC | PRN
Start: 2018-12-25 — End: 2018-12-25

## 2018-12-25 MED ORDER — LIDOCAINE HCL 2 % IJ SOLN
INTRAMUSCULAR | Status: DC | PRN
Start: 2018-12-25 — End: 2018-12-25
  Administered 2018-12-25: 50 mg

## 2018-12-25 MED ORDER — AMMONIA AROMATIC IN INHA
1.0000 | Freq: Once | RESPIRATORY_TRACT | Status: DC | PRN
Start: 2018-12-25 — End: 2018-12-25

## 2018-12-25 MED ORDER — LACTATED RINGERS IV SOLN
INTRAVENOUS | Status: DC | PRN
Start: 2018-12-25 — End: 2018-12-25

## 2018-12-25 MED ORDER — MEPERIDINE HCL 25 MG/ML IJ SOLN
12.5000 mg | Freq: Once | INTRAMUSCULAR | Status: DC | PRN
Start: 2018-12-25 — End: 2018-12-25

## 2018-12-25 MED ORDER — SODIUM CHLORIDE BACTERIOSTATIC 0.9 % IJ SOLN
INTRAMUSCULAR | Status: AC
Start: 2018-12-25 — End: ?
  Filled 2018-12-25: qty 30

## 2018-12-25 MED ORDER — OXYCODONE HCL 5 MG PO TABS
5.0000 mg | ORAL_TABLET | Freq: Once | ORAL | Status: DC | PRN
Start: 2018-12-25 — End: 2018-12-25

## 2018-12-25 MED ORDER — FAMOTIDINE 10 MG/ML IV SOLN (WRAP)
INTRAVENOUS | Status: DC | PRN
Start: 2018-12-25 — End: 2018-12-25
  Administered 2018-12-25: 20 mg via INTRAVENOUS

## 2018-12-25 MED ORDER — BUPIVACAINE-EPINEPHRINE (PF) 0.5% -1:200000 IJ SOLN
INTRAMUSCULAR | Status: AC
Start: 2018-12-25 — End: ?
  Filled 2018-12-25: qty 10

## 2018-12-25 MED ORDER — FENTANYL CITRATE (PF) 50 MCG/ML IJ SOLN (WRAP)
25.0000 ug | INTRAMUSCULAR | Status: DC | PRN
Start: 2018-12-25 — End: 2018-12-25

## 2018-12-25 MED ORDER — ACETAMINOPHEN 500 MG PO TABS
ORAL_TABLET | ORAL | Status: AC
Start: 2018-12-25 — End: ?
  Filled 2018-12-25: qty 2

## 2018-12-25 MED ORDER — METOCLOPRAMIDE HCL 5 MG/ML IJ SOLN
5.0000 mg | INTRAMUSCULAR | Status: DC | PRN
Start: 2018-12-25 — End: 2018-12-25

## 2018-12-25 SURGICAL SUPPLY — 127 items
ADHESIVE SKIN CLOSURE DERMABOND ADVANCED (Skin Closure) ×1
ADHESIVE SKIN CLOSURE DERMABOND ADVANCED .7 ML LIQUID APPLICATOR (Skin Closure) ×2 IMPLANT
ADHESIVE SKIN DERMABOND ADV (Skin Closure) ×1
ADHESIVE SKNCLS 2 OCTYL CYNCRLT .7ML (Skin Closure) ×1
APPLIER IN CLP MED LGCLP 9.3IN LF STRL (Clips)
APPLIER IN CLP PRM SGCLP II SUP INTLK (Procedure Accessories)
APPLIER INTERNAL CLIP L9.75 IN AUTOMATIC (Procedure Accessories)
APPLIER INTERNAL CLIP L9.75 IN AUTOMATIC PREMIUM SURGICLIP II SUPER (Procedure Accessories) IMPLANT
APPLIER INTERNAL CLIP MEDIUM L9.3 IN 20 (Clips)
APPLIER INTERNAL CLIP MEDIUM L9.3 IN 20 MULTIPLE OPEN LIGATE LIGACLIP (Clips) IMPLANT
BLADE BOVIE ELCTRD (Cautery)
BRA SURGICAL SUPPORT SM (Patient Supply) ×1
BULB DRAINAGE LIGHTWEIGHT LOW LEVEL (Drain)
BULB DRAINAGE LIGHTWEIGHT LOW LEVEL SUCTION RELIAVAC SILICONE 100 CC (Drain) IMPLANT
BULB DRN SIL 100CC LF STRL LTWT LO LVL (Drain)
CLEANER ELECTROSURGICAL TIP PENCIL (Procedure Accessories)
CLEANER ELECTROSURGICAL TIP PENCIL HANDSWITCH LECTROBRASIVE (Procedure Accessories) IMPLANT
CLEANER ESURG TIP PNCL LCTRBRS STRL (Procedure Accessories)
CLOSURE STERI-STRIP 1X5IN (Dressing) ×1
CNTNRW LID CLR 8OZ (Suction) ×3
CONTAINER SPEC 8OZ NS SNPON LID TRNLU (Suction) ×6 IMPLANT
COVER FLEXIBLE LIGHT HANDLE PLASTIC GREEN (Procedure Accessories) ×2 IMPLANT
COVER FLEXIBLE MEDLINE LIGHT HANDLE (Procedure Accessories) ×2
COVER LGHT HNDL PLS LF STRL FLXB DISP (Procedure Accessories) ×2
CVR LGHTHNDL FLEX SFT 1PK (Procedure Accessories) ×2
DRAN EVACUATOR WOUND 100CC (Drain)
DRAPE 96X5IN ISOSILK SMALL T TIP BAND TAPE STRIP GEL PROBE LATEX FREE (Procedure Accessories) ×1 IMPLANT
DRAPE PRB ISOSILK 96X5IN LF STRL SM T (Procedure Accessories) ×2
DRESSING TEGADERM 4X4X3/4IN (Dressing) ×1
DRESSING TRANSPARENT L4 3/4 IN X W4 IN (Dressing) ×1
DRESSING TRANSPARENT L4 3/4 IN X W4 IN POLYURETHANE ADHESIVE (Dressing) ×1 IMPLANT
DRESSING TRNS PU STD TGDRM 4.75X4IN LF (Dressing) ×1
ELECTRODE ADULT PATIENT RETURN L9 FT REM POLYHESIVE ACRYLIC FOAM (Procedure Accessories) ×1 IMPLANT
ELECTRODE ELECTROSURGICAL BLADE L2.4 IN (Cautery)
ELECTRODE ELECTROSURGICAL BLADE L2.4 IN OD3/32 IN VALLEYLAB STAINLESS (Cautery) IMPLANT
ELECTRODE ESURG SS BLDE VLAB 3/32IN 2.4 (Cautery)
ELECTRODE PATIENT RETURN L9 FT VALLEYLAB (Procedure Accessories) ×1
ELECTRODE PT RTN RM PHSV ACRL FM C30- LB (Procedure Accessories) ×1
GAUZE SPONGE 4X4 NS (Dressing) ×1
GAUZE SPONGE VERSALON 4PLY 4X4 (Dressing) ×2
GLOVE SRG PLISPRN 7 BGL PI MIC 288MM LF (Glove) ×1
GLOVE SURGICAL 7 BIOGEL PI MICRO POWDER (Glove) ×1
GLOVE SURGICAL 7 BIOGEL PI MICRO POWDER FREE BEAD CUFF MICRO ROUGHEN (Glove) ×1 IMPLANT
GLOVES BIOGEL PI MICRO SZ 7 (Glove) ×1
LIGACLIP MEDIUM 9 3/8 INCHES (Clips)
MRKR SKN W RULER AND LABELS (Positioning Supplies) ×1
NEEDLE  LOC BREAST  20GX7CM (Needles)
NEEDLE  LOC BREAST 20GX5CM (Needles) ×2
NEEDLE  LOC BREAST 20GX7CM (Needles)
NEEDLE 25GA 1 1/2 (Needles) ×1
NEEDLE HPO PP RW SFGLD 22GA 1.5IN LF (Needles) ×6 IMPLANT
NEEDLE HPO SS PP RW BD 25GA 1.5IN LF (Needles) ×2 IMPLANT
NEEDLE INJ SFTY 22GX1.5IN (Needles) ×3
NEEDLE LCLZTN BREAST BIOPSY THRD WR ENTRY STFFND PRLD KOPANS 7CMX20GA (Needles) IMPLANT
NEEDLE LOC BREAST 20GX5CM (Needles) ×4
NEEDLE LOC BREAST 20GX7CM (Needles)
NEEDLE LOCALIZATION L5 CM OD20 GA KOPANS EASY THREAD WIRE STIFFEN (Needles) ×2 IMPLANT
PACK SRG LF STRL MAJ BRST WOM DISP (Pack) ×2
PAD ARMBOARD FOAM 20X8X2IN (Positioning Supplies) ×2
PAD ARMBOARD L20 IN X W8 IN X H2 IN (Positioning Supplies) ×1
PAD ARMBOARD L20 IN X W8 IN X H2 IN CONVOLUTE FOAM PURPLE (Positioning Supplies) ×1 IMPLANT
PAD ELECTROSRG GRND REM W CRD (Procedure Accessories) ×1
PAD PREP CUFF 24X41IN W 9IN (Prep) ×1
PAD SRGPRP 44X24IN NS CUF 9IN (Prep) ×2 IMPLANT
PAD VALLEYLAB SCRATCH 5.08CM (Procedure Accessories)
PEN SRGMRK 6IN LF STRL RLR LG RSRV REG (Positioning Supplies) ×1
PEN SURGICAL MARKING MEDLINE SKIN RULER (Positioning Supplies) ×1
PEN SURGICAL MARKING SKIN RULER LARGE RESERVOIR REGULAR TIP LABEL (Positioning Supplies) ×1 IMPLANT
POSITIONER HEAD FOAM 7X2", LF (Positioning Supplies) ×1
POSITIONER OR FM LF HI RSLNT CSHN (Positioning Supplies) ×1
POSITIONER OR HIGH RESILIENT CUSHION (Positioning Supplies) ×1
POSITIONER OR HIGH RESILIENT CUSHION MULTIRING FOAM HEAD RASPBERRY (Positioning Supplies) ×1 IMPLANT
PROBE COVER T SHAPE LATEX FREE (Procedure Accessories) ×1
PROBE MARGINPROBE LUMPCTMY (Procedure Accessories) ×3
PROBE MARGINPROBE LUMPECOTMY (Procedure Accessories) ×1 IMPLANT
SOL NACL.9% 1000ML IRR NONLTX (Irrigation Solutions) ×1
SOLUTION BETADINE 4OZ (Scrub Supplies) ×1
SOLUTION BETADINE 8OZ (Prep) ×1
SOLUTION IRR 0.9% NACL 1000ML LF STRL (Irrigation Solutions) ×1
SOLUTION IRRIGATION 0.9% SODIUM CHLORIDE (Irrigation Solutions) ×1
SOLUTION IRRIGATION 0.9% SODIUM CHLORIDE 1000 ML PLASTIC POUR BOTTLE (Irrigation Solutions) ×1 IMPLANT
SOLUTION PREP BETADINE 10% PVP IODINE 8OZ BOTTLE SKIN (Prep) ×1 IMPLANT
SOLUTION PRP 10% PVP IOD 8OZ BDINE BTL (Prep) ×2
SOLUTION SRGSCRB 10% PVP IOD 4OZ PLS BTL (Scrub Supplies) ×2
SOLUTION SURGICAL SCRUB 10% PVP IODINE 4OZ PLASTIC BOTTLE AQUEOUS SKIN (Scrub Supplies) ×1 IMPLANT
SPONGE GAUZE L4 IN X W4 IN 16 PLY (Dressing) ×1
SPONGE GAUZE L4 IN X W4 IN 16 PLY MAXIMUM ABSORBENT USP TYPE VII (Dressing) ×1 IMPLANT
SPONGE GAUZE L4 IN X W4 IN 4 PLY HIGH (Dressing) ×2
SPONGE GAUZE L4 IN X W4 IN 4 PLY HIGH ABSORBENT NONWOVEN LINT FREE (Dressing) ×1 IMPLANT
SPONGE GZE CTTN CRTY 4X4IN LF NS 16 PLY (Dressing) ×1
SPONGE GZE RYN PLSTR CRTY VRSLN 4X4IN LF (Dressing) ×2
STRIP SKIN CLOSURE L5 IN X W1 IN (Dressing) ×1
STRIP SKIN CLOSURE L5 IN X W1 IN REINFORCE STERI-STRIP POLYESTER WHITE (Dressing) ×1 IMPLANT
STRIP SKNCLS PLSTR STRSTRP 5X1IN LF STRL (Dressing) ×1
SUPPORT MAM VLCR CTTN SPNDX 34-36IN SM (Patient Supply) ×1
SUPPORT MAMMARY 34-36 IN SMALL PAD (Patient Supply) ×1
SUPPORT MAMMARY 34-36 IN SMALL PAD SHOULDER STRAP REMOVABLE DRAIN BULB (Patient Supply) ×1 IMPLANT
SURGICAL BRA LARGE (Procedure Accessories) ×3 IMPLANT
SURGICAL BRA MEDIUM (Procedure Accessories) ×3 IMPLANT
SURGICAL BRA XLARG (Procedure Accessories) ×3 IMPLANT
SURGICLIP PREMIUM II M-9.75 (Procedure Accessories)
SUT VICRYL 3-0 SH 27IN (Suture) ×1
SUTURE ABS 2-0 SH PDS2 27IN MFL VIOL (Suture)
SUTURE ABS 3-0 SH VCL 27IN BRD COAT UD (Suture) ×1
SUTURE ABS 4-0 PS2 MNCRL MTPS 27IN MFL (Suture) ×1
SUTURE COATED VICRYL 3-0 SH L27 IN BRAID (Suture) ×1 IMPLANT
SUTURE MONOCRYL 4-0 PS-2 L27 IN (Suture) ×1
SUTURE MONOCRYL 4-0 PS-2 L27 IN MONOFILAMENT UNDYED ABSORBABLE (Suture) ×1 IMPLANT
SUTURE MONOCRYL 4-0 PS2 27IN (Suture) ×1
SUTURE NABSB SLK 3-0 SH PRMHND 30IN BRD (Suture) ×1
SUTURE PDS II 2-0 SH L27 IN MONOFILAMENT (Suture)
SUTURE PDS II 2-0 SH L27 IN MONOFILAMENT VIOLET ABSORBABLE (Suture) IMPLANT
SUTURE PDS II 2-0 SH2 7IN (Suture)
SUTURE SILK 3-0 (Suture) ×1
SUTURE SILK PERMA HAND BLACK 3-0 SH L30 (Suture) ×1
SUTURE SILK PERMA HAND BLACK 3-0 SH L30 IN BRAID NONABSORBABLE (Suture) ×1 IMPLANT
TAPE MICROFOAM 2INX5.5YD (Tape)
TAPE SRG FM MCFM 5.5YDX2IN LF HPOAL WTR (Tape)
TAPE SURGICAL L5 1/2 YD X W2 IN (Tape)
TAPE SURGICAL L5 1/2 YD X W2 IN HYPOALLERGENIC WATER RESISTANT (Tape) IMPLANT
TRAY DRY SKIN SCRUB (Prep) ×1
TRAY MAJOR BREAST WOMENS (Pack) ×1
TRAY PREOPERATIVE SKIN DRY SCRUB (Tray) ×3 IMPLANT
TRAY SKIN SCRUB L8 IN 6 WING 6 SPONGE STICK 2 TIP APPLICATOR DRY VINYL (Prep) ×1 IMPLANT
TRAY SKIN SCRUB MEDLINE L8 IN VINYL (Prep) ×1
TRAY SKN SCRB VNYL CTTN 8IN LF STRL 6 (Prep) ×1
TRAY SURGICAL MAJOR BREAST (Pack) ×1 IMPLANT

## 2018-12-25 NOTE — Discharge Instr - AVS First Page (Addendum)
[facility]  Henderson Baltimore, MD, FACS        Admit Date: 12/25/2018   Discharge Date: 12/25/2018     Attending: Boykin Peek, MD   Surgeon: Surgeon(s):  Edmiston, Rochele Raring, MD  Consuello Closs, MD      Procedures:  Procedure(s) (LRB):  BREAST, LUMPECTOMY (Left)  BIOPSY, BREAST, TUMOR EXCISION, ULTRASOUND NEEDLE LOCALIZATION (Left)  RADIOFREQUENCY SPECTROSCOPY INTRAOPERATIVE MARGIN ASSESSMENT (Left)    Discharge Instructions after Breast Surgery    1.  The dressing is not water proof.  Remove the outer dressing in 48 hours.   You may shower 48 hours after surgery after remove the dressing.  Wash the incision gently with warm water and mild soap and pat the area dry.  You do not need to redress the incision.    2.  Steri-strips are strips of white tape used to close the incision.  It is okay to shower with these on.  Do not pull them off; they will fall of on their own.    3.  Wear a comfortable bra at all times, even to bed, this helps to keep swelling down.    4. You may return to your regular diet as you feel able.  A healthy well-balanced diet is encouraged and drink lots of fluid.    5. Take pain reliever as directed.  For mild pain you may switch to extra strength Tylenol or Advil.  Do NOT take Tylenol in addition to the prescribed pain medication, as it already contains Tylenol and you do not want to overdose    6. You may resume your previous medications, unless otherwise indicated by your surgeon or primary care physician.    7.  Bruising and mild swelling is common after surgery.  Use an ice pack or bag of frozen peas wrapped in a cloth over the incision for no more than 20 minutes at a time.  Do this as needed.    8. You may resume driving once you are no longer using the prescribed pain medication.  Do not drive if you have a surgical drain.    9.  NEVER drink alcohol or drive a vehicle while taking pain medication.    10. Do not lift more than 10 pounds.  It is OK to walk up and  down stairs.  Keep your activity light until returning for your post operative visit.    11. Pain medication can be constipating.  Take an over the counter stool softener as needed and maintain good hydration.  Drink at least 6-8 glasses of non-caffeinated beverages a day.    12. You will need to come in for a follow up appointment within 1-2 weeks after surgery.  If you do not already have this appointment scheduled with your surgeon, please call the office to do so.  At this visit, your pathology report should be available and will be discussed.    13. If you had a sentinel lymph node biopsy:Your urine will be bluish in color after surgery. Your skin may also have a faint bluish color. This will go away as the dye clears from your body. Staying hydrated is important to help flush your system.      14. Please feel free to contact the office for any questions or concerns. (Office-[phone]).  Dr. Ardeen Garland Cell Number is [phone]- only for emergencies or if you are unable to contact the office.    Possible Reasons to call the office:  Fever over 101 F  Redness, drainage or warmth around the incision site  Separation of the skin at the incision site  Pain that is not relieved with the pain medication  Chest pain or shortness of breath        Post Anesthesia Discharge Instructions    Although you may be awake and alert in the recovery room, small amounts of anesthetic remain in your system for about 24 hours.  You may feel tired and sleepy during this time.      You are advised to go directly home from the hospital.    Plan to stay at home and rest for the remainder of the day.    It is advisable to have someone with you at home for 24 hours after surgery.    Do not operate a motor vehicle, or any mechanical or electrical equipment for the next 24 hours.      Be careful when you are walking around, you may become dizzy.  The effects of anesthesia and/or medications are still present and drowsiness may  occur    Do not consume alcohol, tranquilizers, sleeping medications, or any other non prescribed medication for the remainder of the day.    Diet:  begin with liquids, progress your diet as tolerated or as directed by your surgeon.  Nausea and vomiting may occur in the next 24 hours.

## 2018-12-25 NOTE — Anesthesia Postprocedure Evaluation (Signed)
Anesthesia Post Evaluation    Patient: Katelyn Caldwell    Procedure(s) with comments:  BREAST, LUMPECTOMY - LEFT BREAST LUMPECTOMY WITH ULTRASOUND GUIDED WIRE PLACEMENT IN OR BY MD  BIOPSY, BREAST, TUMOR EXCISION, ULTRASOUND NEEDLE LOCALIZATION  RADIOFREQUENCY SPECTROSCOPY INTRAOPERATIVE MARGIN ASSESSMENT    Anesthesia type: general    Last Vitals:   Vitals Value Taken Time   BP 161/77 12/25/2018  1:20 PM   Temp 36.2 C (97.1 F) 12/25/2018  1:00 PM   Pulse 58 12/25/2018  1:20 PM   Resp 16 12/25/2018  1:20 PM   SpO2 98 % 12/25/2018  1:20 PM       Anesthesia Post Evaluation:     Patient Evaluated: PACU    Level of Consciousness: awake and alert    Pain Management: satisfactory to patient        Anesthetic complications: No      PONV Status: none    Cardiovascular status: hemodynamically stable  Respiratory status: room air and spontaneous ventilation  Hydration status: acceptable        Anesthesia Qualified Clinical Data Registry 2018    PACU Reintubation  Did the Patient have general anesthesia with intubation: No        PONV Adult  Is the patient aged 12 or older: Yes  Did the patient receive recieve a general anesthestic: Yes  Does the patient have 3 or more risk factors for PONV? Yes  Did the patient receive anti-emetics from at least two classes of medications? Yes      PONV Pediatric  Is the patient aged 67-17? No            PACU Transfer Checklist Protocol  Was the patient transferred to the PACU at the conclusion of surgery? Yes  Was a checklist or transfer protocol used? Yes    ICU Transfer Checklist Protocol  Was the patient transferred to the ICU at the conclusion of surgery? No      Post-op Pain Assessment Prior to Anesthesia Care End  Age >=18 and assessed for pain in PACU: Yes  Pacu pain score <7/10: Yes      Perioperative Mortality  Perioperative mortality prior to Anesthesia end time: No    Perioperative Cardiac Arrest  Did the patient have an unanticipated intraoperative cardiac arrest between  anesthesia start time and anesthesia end time? No    Unplanned Admission to ICU  Did the patient have an unplanned admission to the ICU (not initially anticipated at anesthesia start time)? No      Signed by: Nils Flack Jocie Meroney, 12/25/2018 1:24 PM

## 2018-12-25 NOTE — Anesthesia Preprocedure Evaluation (Signed)
Anesthesia Evaluation    AIRWAY    Mallampati: II    TM distance: >3 FB  Neck ROM: full  Mouth Opening:full   CARDIOVASCULAR    regular and normal       DENTAL           PULMONARY    clear to auscultation     OTHER FINDINGS    HTN - active without CP, DOE  Hx of PVCs (reportedly normal ETT) - well controlled with B- BLocker (last pm)     1.  Basal inferoseptal and inferior segments appear   hypokinetic.                 2. Estimated ejection fraction 55%.                 3. Mild concentric left ventricular hypertrophy.                 4. Trace tricuspid regurgitation.                 5. Pulmonary arterial systolic pressure is estimated to be 24   mmHg with a right atrial pressure of 3 mmHg.    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 - eGFR - 49    Sleep apnea - CPAP dependent    NIDDM - BS = 139 this AM (last HgBA1C) = 7                        Relevant Problems   CARDIO   (+) Essential hypertension   (+) Premature ventricular contractions (PVCs) (VPCs)      GI   (+) Gastroesophageal reflux disease without esophagitis      GU/RENAL   (+) Chronic kidney disease, stage III (moderate)      ENDO   (+) Diabetes mellitus   (+) Type 2 diabetes mellitus without complication, without long-term current use of insulin               Anesthesia Plan    ASA 2     general                     Detailed anesthesia plan: general LMA      Post Op: post-op ventilation    Post op pain management: per surgeon    informed consent obtained    Plan discussed with CRNA.    ECG reviewed  pertinent labs reviewed             Signed by: Nils Flack Muna Demers 12/25/18 10:12 AM

## 2018-12-25 NOTE — Op Note (Signed)
Procedure Date: 12/25/2018     Patient Type: A     SURGEON: Boykin Peek MD  ASSISTANT:  Consuello Closs MD     PREOPERATIVE DIAGNOSIS:  Left breast ductal carcinoma in situ.     POSTOPERATIVE DIAGNOSIS:  Left breast ductal carcinoma in situ.     TITLE OF PROCEDURE:  Left partial segmental mastectomy with ultrasound-guided needle  localization, left ultrasound-guided needle placement and intraoperative  margin assessment using the Margin Probe.     ANESTHESIA:  General.     ESTIMATED BLOOD LOSS:  Minimal.     FLUIDS:  Crystalloid.     COMPLICATIONS:  None.      INDICATIONS FOR PROCEDURE:  This is a 68 year old who presented with DCIS in the left breast at the 12  o'clock position.  We are doing this procedure to establish local regional  control.     DESCRIPTION OF PROCEDURE:  The patient was seen in the preoperative holding area.  The planned  procedure was again discussed along with the risks, benefits, options, and  alternatives.  The films were reviewed extensively.  The patient was then  brought back to the operating room, placed on the operating table in the  usual supine position.  After the appropriate monitoring devices were  applied, the patient was given intravenous sedation.  The patient underwent  general anesthesia.  The patient's left breast was then scanned using focus  targeted ultrasound.  The location of the open coil HydroMARK clip was then  identified.  The patient's left breast, chest, and axilla were prepped and  draped in the usual sterile manner.     Again, using focus targeted ultrasound, the location of the open coil HydroMARK clip was identified and  localized with 2 orthogonal 5-cm Copan's wires.  Photo documentation was  obtained.  A local field block was established using 1% lidocaine and 0.5%  Marcaine.  A curvilinear incision was made and carried down through the  skin and subcutaneous tissue right at the areolar margin.  A flap was  created to the location of the 2 Copan's  wires.  The tissue surrounding  both wires was dissected out using Metzenbaum scissors and electrocautery.   Hemostasis was achieved using electrocautery.  The specimen was oriented.   The 6 faces of the specimen were evaluated using the intraoperative margin  probe/radio spectroscopy.  The superior medial margin was noted to be  questionably positive.  Similarly, the lateral margin was also noted to be  questionably positive.  Additional tissue was taken from both the  superomedial aspect as well as the lateral aspect.  The inferior margin was  negative.  The deep margin was just on top of the pectoralis.  The  superficial margin was just under the skin.  The specimen mammogram then  confirmed the presence of both the open coil HydroMARK clip as well as the  previously placed benign clips.  The wound was irrigated.  Hemostasis was  checked for and achieved using electrocautery.  The location and the  dimensions of the cavity were then identified by surgical clips.  The  breast parenchyma was then mobilized both medially and laterally.  It was  then reapproximated using figure-of-eight 3-0 PDS.  The wound was then  further irrigated and inspected.  There was no evidence of any bleeding.   The skin was redraped and closed in 2 layers with interrupted 3-0 Vicryl  and subcuticular 4-0 PDS.  The wound was dressed in  the usual sterile  manner.  The patient tolerated the procedure well.  The patient was awoken  and transferred to the recovery room in stable condition.           D:  12/25/2018 12:41 PM by Dr. Rochele Raring. Layni Kreamer, MD [id number])  T:  12/25/2018 12:58 PM by NTS      (Conf: [id number]) (Doc ID: [id number])

## 2018-12-25 NOTE — Telephone Encounter (Signed)
Spoke to ICL, urine culture not reflected. Per results: Patient without significant pyuria(ie. >10 WBC/HPF).   Urine culture not indicated. [name] has given the patient abx for possible UTI. Please addend your note.

## 2018-12-25 NOTE — Transfer of Care (Signed)
Anesthesia Transfer of Care Note    Patient: Katelyn Caldwell    Procedures performed: Procedure(s) with comments:  BREAST, LUMPECTOMY - LEFT BREAST LUMPECTOMY WITH ULTRASOUND GUIDED WIRE PLACEMENT IN OR BY MD  BIOPSY, BREAST, TUMOR EXCISION, ULTRASOUND NEEDLE LOCALIZATION  RADIOFREQUENCY SPECTROSCOPY INTRAOPERATIVE MARGIN ASSESSMENT    Anesthesia type: General LMA    Patient location:Gyn PACU    Last vitals:   Vitals:    12/25/18 1232   BP: 148/80   Pulse: 72   Resp: 16   Temp: (!) 35.8 C (96.4 F)   SpO2: 100%       Post pain: Patient not complaining of pain, continue current therapy      Mental Status:awake and alert     Respiratory Function: tolerating nasal cannula    Cardiovascular: stable    Nausea/Vomiting: patient not complaining of nausea or vomiting    Hydration Status: adequate    Post assessment: no apparent anesthetic complications, no reportable events and no evidence of recall    Signed by: Earle Gell  12/25/18 12:33 PM

## 2018-12-25 NOTE — Interval H&P Note (Signed)
PRESURGICAL CHECKLIST    I have personally reviewed the patient's H and P and all relevant imaging. I have re- examined the patient and marked the appropriate site.  There are no changes to the patient's physical condition or planned procedure.    Exceptions:None  Cor:RRR  Lungs:CTA    I have discussed the planned procedure along with the risks and benefits with the patient.  All questions were answered.  The written consent was reviewed with the patient and obtained. We will proceed.    Henderson Baltimore, MD, FACS  [facility]    Allergies:   Allergies   Allergen Reactions    Ciprofloxacin      Joint pain    Nsaids      Pt states this causes her bleeding       Antibiotics: Ancef 1 gram IV on call     Medical clearance:None    Smoking Screen:No history    Diabetic Screen: No history    Pain Management: Counseling provided    Sleep Apnea: No history    DVT Screen:  Patients undergoing breast cancer surgery are at increased risk of developing DVT's and pulmonary embolisms.      Each Risk Factor Represents 1 Point.    This patient has:  Lumpectomy/excisional biopsy/simple mastectomy without reconstruction    Each Risk Factor Represents 2 Points  This patient has:  Age 66-74 years  Malignancy (present or previous)    Each Risk Factor Represents 3 Points  This patient has:  None    Each Risk Factor Represents 5 points  This patient has:  None    Total:  5      Based on this screen, the patient is at Moderate Risk.  We need to weigh the risks and benefits of DVT/PE prophylaxis vs bleeding.    Would recommend:  Total Risk= 0  (<0.1%)       Very low= Early ambulation    Total Risk = 1-2 (0.1%)     Low=  Sequential compression device (SCD)    Total Risk = 3-4 (0.6%) Low/ Moderate= Sequential compression device (SCD)     Total Risk = 5-6 (1.27%) Moderate =Sequential compression device (SCD)    Total Risk = 7-8 (2.5%) High= SCD and Lovenox* (40 mg SQ for 10 days)    Total Risk=>8 (11.3%) High= SCD and Lovenox* (  40 mg SQ for 10 days)

## 2018-12-25 NOTE — Brief Op Note (Signed)
BRIEF OP NOTE    Date Time: 12/25/18 12:38 PM    Patient Name:   Katelyn Caldwell    Date of Operation:   12/25/2018    Providers Performing:   Surgeon(s):  Tysha Grismore, Rochele Raring, MD  Consuello Closs, MD    Assistant (s):   Circulator: Jenetta Loges, RN  Relief Circulator: Tristan Schroeder, RN  Relief Scrub: Rudell Cobb  Scrub Person: Caleb Popp  Team Leader: Janalee Dane, RN    Operative Procedure:   Left Partial Segmental Mastectomy with Ultrasound Guided Needle Localization  Left Ultrasound Guided Needle Placement  RADIOFREQUENCY SPECTROSCOPY INTRAOPERATIVE MARGIN ASSESSMENT    Preoperative Diagnosis:   Pre-Op Diagnosis Codes:     * 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]    Postoperative Diagnosis:   Post-Op Diagnosis Codes:     * 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]    Anesthesia:   General    Estimated Blood Loss:    * No values recorded between 12/25/2018 11:14 AM and 12/25/2018 12:28 PM *    Implants:   * No implants in log *    Drains:   Drains: no    Specimens:   * No specimens in log *     SPECIMENS (last 24 hours)      Pathology Specimens     Row Name 12/25/18 1200                Additional Information    Send final report to:  Dr Cyndi Bender          Specimen Information    Specimen Testing Required  Routine Pathology       Specimen ID   A       Specimen Description  Left superior 12 o'clock lesion, long laterall, short superior       Breast Tissue?  yes       Excision Time  1150       Time in Formalin  1210          Specimen Information    Specimen Testing Required  Routine Pathology       Specimen ID   B       Specimen Description  Left breast new superior medial margin, stitch at new superior medial margin       Breast Tissue?  Yes       Excision Time  1201       Time in Formalin  1209          Specimen Information    Specimen Testing Required  Routine Pathology       Specimen ID   C       Specimen Description  Left breast new lateral margin, stitch at new  lateral margin       Breast Tissue?  Yes       Excision Time  1203       Time in Formalin  1209             Findings:   Clips in specimen    Complications:   None      Signed by: Boykin Peek, MD  [facility]

## 2018-12-26 ENCOUNTER — Encounter (HOSPITAL_BASED_OUTPATIENT_CLINIC_OR_DEPARTMENT_OTHER): Payer: Self-pay | Admitting: Surgery

## 2018-12-26 ENCOUNTER — Telehealth (HOSPITAL_BASED_OUTPATIENT_CLINIC_OR_DEPARTMENT_OTHER): Payer: Self-pay | Admitting: Family Nurse Practitioner

## 2018-12-26 NOTE — Telephone Encounter (Signed)
[facility]     This writer call patient to receive consent for the doctor to call patient and provide advice and results by phone or Video. Patient gave the consent and she is aware that if she has a copayment she would get billed.    [name] -  Oncology Patient Specialist II  [facility]   [facility]    [address]  [place], [place] [address]  P [phone] F [phone]

## 2018-12-27 ENCOUNTER — Telehealth (HOSPITAL_BASED_OUTPATIENT_CLINIC_OR_DEPARTMENT_OTHER): Payer: Self-pay

## 2018-12-31 ENCOUNTER — Encounter (HOSPITAL_BASED_OUTPATIENT_CLINIC_OR_DEPARTMENT_OTHER): Payer: Self-pay | Admitting: Family Nurse Practitioner

## 2018-12-31 ENCOUNTER — Telehealth (INDEPENDENT_AMBULATORY_CARE_PROVIDER_SITE_OTHER): Payer: Medicare Other | Admitting: Family Nurse Practitioner

## 2018-12-31 DIAGNOSIS — D0512 Intraductal carcinoma in situ of left breast: Secondary | ICD-10-CM

## 2018-12-31 DIAGNOSIS — Z170 Estrogen receptor positive status [ER+]: Secondary | ICD-10-CM

## 2018-12-31 NOTE — Patient Instructions (Signed)
Mrs. Fusco,    I released your pathology results to your MyChart and will send via e-mail as well.     As discussed, Windell Moulding will contact medical oncology and radiation oncology for appointments. A scheduler from their teams will reach out to you to get those appointments set up.     You will discuss with [name] about a future re-excision due to close DCIS margins- not urgent.     Resume exercise as tolerated, while using caution when exerting pressure on chest wall (No push-ups, bench presses, etc. Until 4 weeks post-op).     Call me anytime with questions or concerns. Call right away if you experience increased pain, swelling, redness, discharge from the incision, or if you have fever and chills.     Stay safe!    Take care,    [facility]

## 2018-12-31 NOTE — Telephone Encounter (Signed)
Treatment Team  Ref Prov:    Primary Care Physician:   Raj Janus, MD Radiology facility: [facility])--[phone] Breast Surgeon : Henderson Baltimore MD  [phone]   Plastic Surgeon none Radiation Onc:  Miguel Dibble MD:  [phone] [facility] [place]); Medical Onc:  Laney Pastor, MD [phone] [facility] [place])      Breast Cancer Comprehensive Care Plan Summary  Date of diagnosis: 11/03/2018 Event : DCIS ECOG Performance: Grade 0  (Fully active) Genetic Testing :not indicated   Presentation : Abnormal mammogram with microcalcifications Side: left Focality :  unifocal Location: radian 12:00   Biologic Tumor characteristics  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 Ki-67:  DCIS - not indicated Oncotype Dx:  DCIS , not indicated   Estrogen Receptor: positive 19 % Progesterone Receptor: negative Her-2-neu Receptor: DCIS, not indicated Androgen Receptor;  not done   Staging  Tumor Size:   DCIS with <28mm microinvasion Nodes: clinically/US negative Systemic Metastases: clinically negative Stage: Stage0 , Tmic N0 M0   Treatment  Modality Date    Surgery   12/25/18 left, partial mastectomy with needle localization    Radiation     Endocrine     Chemotherapy     Biologic     Clinical trial           [facility]  TELEMEDICINE POST OPERATIVE VISIT NOTE    Telemedicine Documentation Requirements    Originating site (Patient location): patient home  Distant site (Provider location): [facility]  Provider and Title: Marigene Ehlers, NP  Consent obtained: YES/NO: No    Language, if applicable and if translator was required: English    This visit is being conducted via telephone.    Verbal consent has been obtained from the patient to conduct a telephone visit encounter to minimize exposure to COVID-19: yes    Subjective:  [name] had left partial segmental mastectomy on 12/25/18 . She reports discomfort at the surgical site and  itching related to surgical bra. Denies severe pain, breast swelling and purulent discharge from the incision.   She is otherwise active . She is tolerating her diet well. Pathology results as below.    Limited Exam:  Clinically, she is doing well   She reports no redness at incision site. She does have dry skin and some itching in the surrounding area. Steri strips are in place.     Assessment and Plan:  Clinically, she is doing well.  We have reviewed the results of the pathology with the patient.  I have given the patient a copy of the pathology report via MyChart.  At this point, I would recommend that we set her up for a follow up left mammogram in 6 months.  She is also encouraged to follow up with medical and radiation oncology.   She does have close margins of DCIS- discussed with [name]. Re-excision will be planned in the future when pandemic calms down. Patient and her husband verbalized understanding.     Pathology  SURGICAL PATHOLOGY REPORT     DIAGNOSIS:       A. LEFT BREAST, 12 O'CLOCK LESION, PARTIAL SEGMENTAL MASTECTOMY:       - DUCTAL CARCINOMA IN SITU, GRADE 3, SOLID PATTERN,     - FOCUS OF MICROINVASION < 1 MM, SEE SYNOPTIC SUMMARY     - BIOPSY SITE CHANGES     B. LEFT BREAST, NEW SUPERIOR/MEDIAL MARGIN, EXCISION WITH  ASSESSMENT OF    MARGIN:       - BENIGN BREAST AND ADIPOSE TISSUE     - NEGATIVE FOR MALIGNANCY     C. LEFT BREAST, NEW LATERAL MARGIN, EXCISION WITH ASSESSMENT OF MARGIN:       - DUCTAL CARCINOMA IN SITU, GRADE 3, SOLID PATTERN WITH CENTRAL      NECROSIS     - DUCTAL CARCINOMA IN SITU IS < 1 MM FROM THE NEW LATERAL MARGIN      AT TWO FOCI THAT MEASURE 1 MM EACH (SLIDE C4)              SYNOPTIC SUMMARY FOR LEFT BREAST CARCINOMA     1. LYMPH NODE SAMPLING: NO LYMPH NODES REMOVED WITH THE SPECIMEN   2. SPECIMEN INTEGRITY: INTACT   3. SPECIMEN SIZE PARTIAL SEGMENTAL MASTECTOMY, SPECIMEN A:   -   3.0 CM FROM MEDIAL TO LATERAL     -  2.5 CM FROM SUPERIOR TO INFERIOR   -  2.0 CM FROM ANTERIOR TO POSTERIOR     4. INVASIVE CARCINOMA:      A. TUMOR SITE: LEFT BREAST, 12 O'CLOCK, (SLIDE A6)     B. TUMOR FOCALITY: UNIFOCAL     C. TUMOR SIZE: LESS THAN 1 MM (< 0.1 CM)    D. TUMOR TYPE: INVASIVE DUCTAL CARCINOMA NOT OTHERWISE SPECIFIED    E. TUMOR GRADE (NOTTINGHAM): NUCLEAR GRADE 3, TOO SMALL TO    PERFORM A NOTTINGHAM SCORE)    F. LYMPH-VASCULAR INVASION (LVI): NOT IDENTIFIED    G. TUMOR EXTENT: SKIN, NIPPLE, AND SKELETAL MUSCLE: NOT IDENTIFIED    IN THE SPECIMEN     5. DUCTAL CARCINOMA IN SITU (DCIS): PRESENT     A. EXTENT: PRESENT IN EIGHT OF OF SLIDES ON THE PARTIAL      MASTECTOMY (A2, A3, A4, A5, A6, A8, C4 AND C6)    B. FOCALITY: MULTIFOCAL    C. SIZE: MULTIPLE FOCI THAT MEASURE 1 AND 6 MM SCATTERED ON EIGHT   DIFFERENT SLIDES   D. NUCLEAR GRADE: GRADE 3    E. ARCHITECTURAL PATTERN: SOLID    F. NECROSIS: NOT IDENTIFIED     6. LOBULAR CARCINOMA IN SITU (LCIS): NOT IDENTIFIED   7.  MARGINS OF BOTH INVASIVE CARCINOMA AND DUCTAL CARCINOMA IN SITU   (DCIS)   A.  MARGINS OF INVASIVE CARCINOMA:  INVASIVE CARCINOMA IS AT LEAST     5 MM (0.5 CM) FROM ALL SURGICAL MARGINS   B.  MARGINS OF DCIS:   - < 1 MM FORM THE INFERIOR MARGIN AT TWO FOCI THAT MEASURES 1 MM EACH    (SLIDE A3 AND A4)   - < 1 MM FROM THE ANTERIOR MARGIN OVER A LENGTH OF 1 MM (SLIDE A3)   - < 1 MM FROM THE NEW LATERAL MARGIN AT TWO FOCI THAT MEASURE 1 MM    EACH (SLIDE C4, SEEN BEST ON THE BREAST TRIPLE STAIN)   - DCIS IS NOT IDENTIFIED IN THE NEW SUPERIOR/MEDIAL MARGIN (SPECIMEN    B)   - DCIS IS AT LEAST 5 MM (0.5 CM) FROM THE POSTERIOR MARGIN ON THE    PARTIAL MASTECTOMY SPECIMEN A)   8. PATHOLOGIC STAGING: pTis, pNX   9. ADDITIONAL PATHOLOGIC FINDINGS:    A. METALLIC CLIP IDENTIFIED  ADJACENT TO THE BIOPSY CAVITY ON GROSS    EXAMINATION   B. BIOPSY SITE CHANGES IDENTIFIED ON HISTOLOGIC EXAMINATION     10. MICROCALCIFICATIONS: NOT IDENTIFIED  11. ANCILLARY STUDIES:  TESTS FOR ESTROGEN RECEPTOR and PROGESTERONE    RECEPTOR were PPERFORMED BY [facility] (ACCESSION #[id number])    THE RESULTS ARE AS FOLLOWS:     - ESTROGEN RECEPTOR: POSITIVE 19% STRONG INTENSITY NUCLEAR STAINING   - PROGESTERONE RECEPTOR: NEGATIVE, 0% NUCLEAR STAINING     Tests for estrogen receptor, progesterone receptor, Her2 and Ki67 will   be attempted on the focus of microinvasion. Given the minute size of   foci of microinvasion, receptor studies frequently do not give reliable   results.           Time spent in discussion: 11 to 20 minutes

## 2019-01-01 ENCOUNTER — Other Ambulatory Visit (HOSPITAL_BASED_OUTPATIENT_CLINIC_OR_DEPARTMENT_OTHER): Payer: Self-pay | Admitting: Surgery

## 2019-01-01 ENCOUNTER — Telehealth (HOSPITAL_BASED_OUTPATIENT_CLINIC_OR_DEPARTMENT_OTHER): Payer: Self-pay | Admitting: Surgery

## 2019-01-01 ENCOUNTER — Telehealth (HOSPITAL_BASED_OUTPATIENT_CLINIC_OR_DEPARTMENT_OTHER): Payer: Self-pay

## 2019-01-01 DIAGNOSIS — D0512 Intraductal carcinoma in situ of left breast: Secondary | ICD-10-CM

## 2019-01-01 DIAGNOSIS — R921 Mammographic calcification found on diagnostic imaging of breast: Secondary | ICD-10-CM

## 2019-01-01 NOTE — Telephone Encounter (Signed)
I reviewed the results of the pathology with Dr. Francena Hanly Hetelekidis.  At this point, we will go ahead and set her up for a follow-up mammogram to make sure that there are no residual microcalcifications.  She would be comfortable with proceeding with radiation if there are no residual microcalcifications.  She will be on endocrine therapy.  I have discussed the case with the patient as well.  We will go ahead and schedule her for a follow-up mammogram.

## 2019-01-02 NOTE — Progress Notes (Signed)
[facility]  [address] [place]  [phone]      Einar Gip Office  [phone]        Physician Requesting Consult: Boykin Peek, MD  Breast Surgeon: Dr. Zachary George Onc: Dr. Scheryl Marten  Plastic Surgeon:  PCP: Raj Janus, MD      Reason for Consult:   1. Ductal carcinoma in situ (DCIS) of left breast         Chief Complaint:    1. Ductal carcinoma in situ (DCIS) of left breast           Assessment    ALANEE TING is a 68 y.o.-year-old female with      1. 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 Biopsy of calcs with DICS ER 19% PR 0%  Genetics pending  B. 12/25/18: s/p lumpectomy DCIS Gr 3 focus of microinvasion < 1mm ER 6% PR 0%, otherwise DCIS Gr 3 seen. Close margin.    2. DM2  3. PAN  4. HTN  5. HLD        Menopausal Status: post    We discussed the following data for endocrine therapy after BCS for DCIS. Multiple trials have demonstrated that postoperative tamoxifen is more effective than placebo in reducing the risk of invasive breast cancer, with or without adjuvant radiotherapy, although there is no OS benefit. NSABP B-24 and [place], and [place] DCIS together included 3375 patients and showed the following: addition of tamoxifen after BCS for DCIS reduced the recurrence risk of ipsilateral DCIS HR 0.75, p < 0.05 and contralateral DCIS RR0.50, p< 0.05. There was a trend towards a lower risk of ipsilateral invasive carcinoma HR 0.79 that did not reach statistical significance and a lower risk for contralateral invasive carcinoma RR 0.57, p < 0.05.    The benefit of postoperative tamoxifen is primarily in patients with ER-positive DCIS. The ability of ER status to predict response to tamoxifen for women with DCIS was addressed in a retrospective review of data from the NSABP B-24 trial. At 10 years of f/u, patients with ER-positive DCIS treated with tamoxifen had significant decreases in any subsequent (invasive and/or noninvasive; ipsilateral  and/or contralateral) breast cancer events compared with patients receiving placebo (HR 0.58, 95% CI 0.42-0.81). No significant benefit was observed in ER-negative DCIS.    We discussed endocrine therapy options including tamoxifen vs aromatase inhibitors (3 options).  Reviewed length of therapy, risks / benefits including monitoring blood pressure, lipids, and bone density. We discussed common and uncommon side effects of each including hot flashes, joint stiffness / arthralgias, vaginal dryness, sexual dysfunction.  We reviewed maintaining bone health with diet including intake of calcium and vitamin D, exercise and following bone density with DEXA.    Given her ER < 20% and PR negativity, she can consider endocrine therapy but it is unlikely to be of significant benefit. Her risk reduction would be primarily for a new breast cancer event, unrelated to her current DCIS.       We reviewed lifestyle risk reduction including healthy eating habits, vegetable / plant based diet, regular exercise several times a week, avoiding tobacco, and limiting alcohol use.      Plan    -- proceed with [name] and Dr. Scheryl Marten as planned  -- endocrine therapy unlikely to be of benefit  -- lifestyle risk reduction  -- follow up with me PRN  HPI:    SHALIA BARTKO is a 68 y.o.-year-old female with newly diagnosed   1. Ductal carcinoma in situ (DCIS) of left breast       Chief Complaint   Patient presents with    Initial Consult     Ms. Sievers is transitioning into care and a summary of care was reviewed. She comes in for management of breast cancer.        She was in her USOH until her screening mammogram showed an area of abnormality. She had subsequent imaging and biopsy as detailed extensively below and was diagnosed with DCIS of the L  breast. She underwent lumpectomy and had negative margins.    ER 6% PR 0%      She has healed well and is here today to discuss endocrine therapy.    Breast symptoms: denies mass,  breast pain, nipple changes, skin changes. Did not notice any lymph node enlargement.    Denies F/C/N/V/diarrhea. No CP/SOB. No HA/visual changes/focal weakness.     14 pt ROS otherwise negative. All systems reviewed and negative except as noted above.    Past Medical History:  Past Medical History:   Diagnosis Date    Anxiety     Chronic kidney disease     Diabetes mellitus, type 2     A1C = 7, FBS = 125    Hemorrhagic diathesis September 2003    PAN    Hyperlipidemia     Hypertension     130's over 80's per pt    Malignant neoplasm     Palpitations 08/17/2016    PAN (polyarteritis nodosa) 2003    Premature ventricular contractions (PVCs) (VPCs) 09/09/2016    Sleep apnea     sleeps with CPAP nightly. Pt instructed to bring CPAP on DOS       Family Medical History:  Family History   Problem Relation Age of Onset    Diabetes Mother     Thyroid disease Mother     No known problems Father     Breast cancer Neg Hx     Ovarian cancer Neg Hx        Social History:  Social History     Socioeconomic History    Marital status: Married     Spouse name: Not on file    Number of children: Not on file    Years of education: Not on file    Highest education level: Not on file   Occupational History    Not on file   Social Needs    Financial resource strain: Not on file    Food insecurity:     Worry: Not on file     Inability: Not on file    Transportation needs:     Medical: Not on file     Non-medical: Not on file   Tobacco Use    Smoking status: Never Smoker    Smokeless tobacco: Never Used   Substance and Sexual Activity    Alcohol use: Yes     Alcohol/week: 3.0 standard drinks     Types: 3 Glasses of wine per week     Comment: 1-2 glass of wine a week.    Drug use: No    Sexual activity: Not Currently     Partners: Male     Birth control/protection: Post-menopausal   Lifestyle    Physical activity:     Days per week: Not on file     Minutes per  session: Not on file    Stress: Not on file    Relationships    Social connections:     Talks on phone: Not on file     Gets together: Not on file     Attends religious service: Not on file     Active member of club or organization: Not on file     Attends meetings of clubs or organizations: Not on file     Relationship status: Not on file    Intimate partner violence:     Fear of current or ex partner: Not on file     Emotionally abused: Not on file     Physically abused: Not on file     Forced sexual activity: Not on file   Other Topics Concern    Not on file   Social History Narrative    Not on file       Non-smoker      Gynecologic History:  OB History    No obstetric history on file.       Menopausal Status: post      ROS:  14 pt ROS reviewed with the patient and negative except for as documented in the HPI. All other systems reviewed.    Medications:  Current Outpatient Medications   Medication Sig Dispense Refill    atorvastatin (LIPITOR) 40 MG tablet Take 1 tablet (40 mg total) by mouth daily. 90 tablet 3    clobetasol (TEMOVATE) 0.05 % external solution Apply topically      cyanocobalamin ([employer] VITAMIN B12) 1000 MCG tablet Take 1 tablet (1,000 mcg total) by mouth daily 90 tablet 2    fluticasone (FLONASE) 50 MCG/ACT nasal spray 2 sprays by Nasal route daily      glucose blood (FREESTYLE LITE) test strip Pt uses freestyle freedom lyte test strips testing BID prn 300 each 3    Lancets (FREESTYLE) lancets Use twice daily as needed 300 each 3    lidocaine (LIDODERM) 5 % Place 1 patch onto affected skin every twelve hours as needed for discomfort.  Remove each patch after 12 hours of use 30 patch 2    losartan (COZAAR) 25 MG tablet Take 25 mg by mouth daily      MAGNESIUM PO Take by mouth      metFORMIN (GLUCOPHAGE XR) 500 MG 24 hr tablet Take 1 tablet (500 mg total) by mouth nightly 90 tablet 3    metoprolol succinate XL (TOPROL-XL) 50 MG 24 hr tablet Take 25 mg by mouth daily         SITagliptin-MetFORMIN HCl (JANUMET XR) 50-500 MG Tablet  SR 24 hr Take 1 tablet by mouth 2 (two) times daily with meals. 180 tablet 3    VITAMIN D PO Take 500 Unit by mouth       No current facility-administered medications for this visit.          Physical Exam:    ECOG : 0  Pain score:    Vitals:    01/03/19 1456   BP: 129/78       Weight today: 147 lbs  BP:  Pulse:  Temp:  RR: 16 per min    Gen: well appearing, well nourished NAD.   HEENT: NCAT MMM OP clear no thrush no lesions  Neck: no visible goiter  Chest: breathing even and unlabored, no audible wheezing, speaks full sentences with no SOB  Breast:  LAD: no supraclavicular or cervical visible LAD  Abd:  Skin: no rashes or lesions visible  Ext: no edema, well perfused  Neuro: alert, oriented x 4, EOMI, face symmetric, palate up, tongue ML, MAE.  MS: appropriate, conversant  Judgement intact            Labs:  Reviewed in epic today  Lab Results   Component Value Date    WBC 5.82 12/18/2018    HGB 13.1 12/18/2018    HCT 42.5 12/18/2018    MCV 89.7 12/18/2018    PLT 170 12/18/2018     Lab Results   Component Value Date    CREAT 1.1 12/18/2018    BUN 27.0 (H) 12/18/2018    NA 140 12/18/2018    K 4.3 12/18/2018    CL 101 12/18/2018    CO2 27 12/18/2018     Lab Results   Component Value Date    ALT 13 12/18/2018    AST 19 12/18/2018    ALKPHOS 78 12/18/2018    BILITOTAL 0.5 12/18/2018         Imaging:  Reviewed in epic today    No images are attached to the encounter.    Pathology:  Specimens (From admission, onward)    None            Billing:  [phone]  HPI: new problem, comprehensive history  ROS: 10 pt ROS reviewed  PMH,FMH, social history reviewed  Exam: > 18 points from at least 9 systems  Medical Decision Making: Moderate complexity  New diagnosis  Options reviewed  Risk of recurrence and management discussed      Verbal consent has been obtained from the patient to conduct a telemedicine video visit encounter to minimize exposure to COVID-19: yes            Harriette Ohara, M.D.  Director of Breast  Oncology  [facility]  Assistant Professor of Medicine, [place]  [phone]

## 2019-01-03 ENCOUNTER — Encounter: Payer: Self-pay | Admitting: Medical Oncology

## 2019-01-03 ENCOUNTER — Ambulatory Visit: Payer: Medicare Other | Attending: Medical Oncology | Admitting: Medical Oncology

## 2019-01-03 ENCOUNTER — Encounter (HOSPITAL_BASED_OUTPATIENT_CLINIC_OR_DEPARTMENT_OTHER): Payer: Self-pay

## 2019-01-03 ENCOUNTER — Ambulatory Visit
Admission: RE | Admit: 2019-01-03 | Discharge: 2019-01-03 | Disposition: A | Discharge status: Home / Self Care | Payer: Medicare Other | Source: Ambulatory Visit | Attending: Surgery | Admitting: Surgery

## 2019-01-03 VITALS — BP 129/78 | Ht 64.0 in | Wt 147.0 lb

## 2019-01-03 DIAGNOSIS — D0512 Intraductal carcinoma in situ of left breast: Secondary | ICD-10-CM

## 2019-01-03 NOTE — Progress Notes (Signed)
Met with patient and husband post consultation with Dr Cyndi Bender.Pathology revealsright breast DCIS, nuclear grade III.      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 Discussed community resources to include services with [facility] and mentorship program with [employer]. Provided reputable websites for further information, to include NCCN and the [facility] Treatment Handbook. Noted we will present her case at tumor board. Encouraged her to contact me directly if she has any further questions. All questions were answered at this time.

## 2019-01-04 ENCOUNTER — Other Ambulatory Visit: Payer: Self-pay | Admitting: Surgery

## 2019-01-04 ENCOUNTER — Encounter (INDEPENDENT_AMBULATORY_CARE_PROVIDER_SITE_OTHER): Payer: Self-pay

## 2019-01-07 ENCOUNTER — Ambulatory Visit: Payer: Self-pay | Admitting: Radiation Oncology

## 2019-01-07 NOTE — Progress Notes (Signed)
RADIATION ONCOLOGY FOLLOW UP    VERBAL CONSENT FOR TELEMEDICINE VISIT    This visit was a synchonous telemedicine service rendered via a real time interactive audio and video. Verbal Consent has been obtained from the patient to conduct a telemedicine visit to minimize exposure to COVID-19.    Greater than 50% of our visit time (approximately ) was spent in counseling the patient on the above issues and care coordination.          Diagnosis: DCIS with microinvasion, ER PR weakly positive, status post lumpectomy.        Stage:  1        Previous Radiation Therapy: N/A          Interval History: Katelyn Caldwell presents today to follow-up regarding her recent lumpectomy for biopsy-proven DCIS.  We saw her for preoperative consultation last month, see previous note for details regarding history of present illness and preliminary radiation oncology recommendations.    Katelyn Caldwell underwent lumpectomy with [name] at the left breast 12 o'clock position revealing grade 3 DCIS with microinvasion.      Marland Kitchen LEFT BREAST, 12 O'CLOCK LESION, PARTIAL SEGMENTAL MASTECTOMY:       - DUCTAL CARCINOMA IN SITU, GRADE 3, SOLID PATTERN,     - FOCUS OF MICROINVASION < 1 MM, SEE SYNOPTIC SUMMARY     - BIOPSY SITE CHANGES     B. LEFT BREAST, NEW SUPERIOR/MEDIAL MARGIN, EXCISION WITH ASSESSMENT OF    MARGIN:       - BENIGN BREAST AND ADIPOSE TISSUE     - NEGATIVE FOR MALIGNANCY     C. LEFT BREAST, NEW LATERAL MARGIN, EXCISION WITH ASSESSMENT OF MARGIN:       - DUCTAL CARCINOMA IN SITU, GRADE 3, SOLID PATTERN WITH CENTRAL      NECROSIS     - DUCTAL CARCINOMA IN SITU IS < 1 MM FROM THE NEW LATERAL MARGIN      AT TWO FOCI THAT MEASURE 1 MM EACH (SLIDE C4)              SYNOPTIC SUMMARY FOR LEFT BREAST CARCINOMA     1. LYMPH NODE SAMPLING: NO LYMPH NODES REMOVED WITH THE SPECIMEN   2. SPECIMEN INTEGRITY: INTACT   3. SPECIMEN SIZE PARTIAL SEGMENTAL MASTECTOMY, SPECIMEN A:   -   3.0 CM FROM MEDIAL TO LATERAL     -  2.5 CM FROM SUPERIOR TO INFERIOR   -  2.0 CM FROM ANTERIOR TO POSTERIOR     4. INVASIVE CARCINOMA:      A. TUMOR SITE: LEFT BREAST, 12 O'CLOCK, (SLIDE A6)     B. TUMOR FOCALITY: UNIFOCAL     C. TUMOR SIZE: LESS THAN 1 MM (< 0.1 CM)    D. TUMOR TYPE: INVASIVE DUCTAL CARCINOMA NOT OTHERWISE SPECIFIED    E. TUMOR GRADE (NOTTINGHAM): NUCLEAR GRADE 3, TOO SMALL TO    PERFORM A NOTTINGHAM SCORE)    F. LYMPH-VASCULAR INVASION (LVI): NOT IDENTIFIED    G. TUMOR EXTENT: SKIN, NIPPLE, AND SKELETAL MUSCLE: NOT IDENTIFIED    IN THE SPECIMEN     5. DUCTAL CARCINOMA IN SITU (DCIS): PRESENT     A. EXTENT: PRESENT IN EIGHT OF OF SLIDES ON THE PARTIAL      MASTECTOMY (A2, A3, A4, A5, A6, A8, C4 AND C6)    B. FOCALITY: MULTIFOCAL    C. SIZE: MULTIPLE FOCI THAT MEASURE 1 AND 6 MM SCATTERED ON EIGHT   DIFFERENT SLIDES   D.  NUCLEAR GRADE: GRADE 3    E. ARCHITECTURAL PATTERN: SOLID    F. NECROSIS: NOT IDENTIFIED     6. LOBULAR CARCINOMA IN SITU (LCIS): NOT IDENTIFIED   7.  MARGINS OF BOTH INVASIVE CARCINOMA AND DUCTAL CARCINOMA IN SITU   (DCIS)   A.  MARGINS OF INVASIVE CARCINOMA:  INVASIVE CARCINOMA IS AT LEAST     5 MM (0.5 CM) FROM ALL SURGICAL MARGINS   B.  MARGINS OF DCIS:   - < 1 MM FORM THE INFERIOR MARGIN AT TWO FOCI THAT MEASURES 1 MM EACH    (SLIDE A3 AND A4)   - < 1 MM FROM THE ANTERIOR MARGIN OVER A LENGTH OF 1 MM (SLIDE A3)   - < 1 MM FROM THE NEW LATERAL MARGIN AT TWO FOCI THAT MEASURE 1 MM    EACH (SLIDE C4, SEEN BEST ON THE BREAST TRIPLE STAIN)   - DCIS IS NOT IDENTIFIED IN THE NEW SUPERIOR/MEDIAL MARGIN (SPECIMEN    B)   - DCIS IS AT LEAST 5 MM (0.5 CM) FROM THE POSTERIOR MARGIN ON THE    PARTIAL MASTECTOMY SPECIMEN A)   8. PATHOLOGIC STAGING: pTis, pNX   9. ADDITIONAL PATHOLOGIC FINDINGS:    A. METALLIC CLIP IDENTIFIED  ADJACENT TO THE BIOPSY CAVITY ON GROSS    EXAMINATION   B. BIOPSY SITE CHANGES IDENTIFIED ON HISTOLOGIC EXAMINATION     10. MICROCALCIFICATIONS: NOT IDENTIFIED     11. ANCILLARY STUDIES:  TESTS FOR ESTROGEN RECEPTOR and PROGESTERONE    RECEPTOR were PPERFORMED BY [facility] (ACCESSION #[id number])    THE RESULTS ARE AS FOLLOWS:     - ESTROGEN RECEPTOR: POSITIVE 19% STRONG INTENSITY NUCLEAR STAINING   - PROGESTERONE RECEPTOR: NEGATIVE, 0% NUCLEAR STAINING     Tests for estrogen receptor, progesterone receptor, Her2 and Ki67 will   be attempted on the focus of microinvasion. Given the minute size of   foci of microinvasion, receptor studies frequently do not give reliable   results.     Given the multiple close margins noted above, and her presentation with initial suspicious microcalcifications, postoperative diagnostic mammogram was performed 3 days ago.  Results do reveal suspicious residual microcalcifications in the region of concern.    Katelyn Caldwell otherwise feels well, denying fevers, chills, nausea vomiting or shortness of breath.  Katelyn Caldwell presents to discuss the role for radiation therapy in her care and her neck steps for local therapy.          Current Medications:    Current Outpatient Medications:     atorvastatin (LIPITOR) 40 MG tablet, Take 1 tablet (40 mg total) by mouth daily., Disp: 90 tablet, Rfl: 3    cyanocobalamin ([employer] VITAMIN B12) 1000 MCG tablet, Take 1 tablet (1,000 mcg total) by mouth daily, Disp: 90 tablet, Rfl: 2    fluticasone (FLONASE) 50 MCG/ACT nasal spray, 2 sprays by Nasal route daily, Disp: , Rfl:     losartan (COZAAR) 25 MG tablet, Take 25 mg by mouth daily, Disp: , Rfl:     MAGNESIUM PO, Take by mouth, Disp: , Rfl:     metFORMIN (GLUCOPHAGE XR) 500 MG 24 hr tablet, Take 1 tablet (500 mg total) by mouth nightly, Disp: 90 tablet, Rfl: 3    metoprolol succinate XL (TOPROL-XL) 50 MG 24 hr tablet, Take 25 mg by mouth daily  , Disp: , Rfl:      SITagliptin-MetFORMIN HCl (JANUMET XR) 50-500 MG Tablet SR 24 hr, Take 1 tablet by mouth 2 (two) times  daily with meals., Disp: 180 tablet, Rfl: 3    VITAMIN D PO, Take 500 Unit by mouth, Disp: , Rfl:     clobetasol (TEMOVATE) 0.05 % external solution, Apply topically, Disp: , Rfl:     glucose blood (FREESTYLE LITE) test strip, Pt uses freestyle freedom lyte test strips testing BID prn, Disp: 300 each, Rfl: 3    Lancets (FREESTYLE) lancets, Use twice daily as needed, Disp: 300 each, Rfl: 3    lidocaine (LIDODERM) 5 %, Place 1 patch onto affected skin every twelve hours as needed for discomfort.  Remove each patch after 12 hours of use, Disp: 30 patch, Rfl: 2        Allergies:  Allergies   Allergen Reactions    Ciprofloxacin      Joint pain    Nsaids      Pt states this causes her bleeding       Physical exam reveals her to be in no acute distress.  Remainder of exam is deferred for telemedicine visit.          Assessment/Plan:    DCIS with microinvasion of the left breast, status post lumpectomy with close but clear margins, but residual suspicious microcalcifications also seen on mammogram.    I discussed her case with [name] and we are in consensus agreement that reexcision would be indicated in this setting to obtain wider clear margins.    I explained to the patient that we would not be ordering prelude testing at this juncture, as this is not indicated for microinvasive disease but only for pure DCIS.    Following reexcision Katelyn Caldwell would appear to be a good candidate for postoperative accelerated whole breast radiotherapy to maximize local control and reduce the risk of relapse of either invasive or in situ disease moving forward.    I would tentatively recommend 16 fractions to the whole breast followed by a 3-4 fraction boost to the tumor bed, with treatment delivery over roughly 4 weeks time.    We will utilize deep inspiration breath-hold gating technology to minimize cardiac exposure in the  setting.    We reviewed the risks of radiotherapy:    Short-term, reversible:  Skin reactions, breast tenderness, fatigue.    Subacute: pneumonitis, pericarditis (very rare with modern techniques)    Long-term: late skin changes, subcutaneous fibrosis, potential impact on wound healing/cosmesis, small risk of cardiotoxicity, small risk of rib fracture, , very small risk of radiation-induced malignancy.    All questions and concerns were answered in full detail.  Katelyn Caldwell will schedule reexcision with [name] in the near future, and see [place] back for follow-up and planning thereafter.  The timing of her treatment delivery will take into account the current pandemic situation, in an attempt to maximize safe delivery of treatment in a reasonably low risk timeframe.  We will discuss this further in follow-up.    Thanks again for allowing me to participate in the care of this pleasant woman.

## 2019-01-07 NOTE — Progress Notes (Signed)
Katelyn Caldwell wished to consent today to the DCISion RT Study today after discussing questions/concerns with Dr. Dorene Grebe and Clinical Research Coordinator, Dyonna Jaspers.     Study Name: DCISion RT  Protocol Number: [id number]  [facility] #[id number]  Version # of ICF used: 05/25/2018  Name of person obtaining consent: Katelyn Caldwell  Date consent was signed: 12/12/2018     Katelyn Caldwell volunteered to participate in the DCISion RT Research Study. Prior to signing the informed consent form, the purpose, procedures, risks, and benefits of the trial were explained in detail. Ample time provided to patient for consideration of all options. Alternatives to participation in the protocol were also outlined. Katelyn Caldwell encouraged to ask questions, all of which were satisfactorily answered by research team.  Dr. Dorene Grebe  was also actively involved in the consent process and answered questions as needed. In accordance with HIPAA, a Medical Records Release and General Authorization to Use and Disclose Health Information for Research is included in the Informed Consent form and was also discussed with Katelyn Caldwell. After full consideration, she agrees to comply with the requirements of the study, and consent was obtained today, 12/12/2018, at approximately 1300. Consent is indicated by a signed consent form and signed HIPAA form. A signed copy of the consent and HIPAA forms provided to the patient. Following consent, we initiated protocol related activities, and subject registered in the DCISion RT database. Patient will meet enrollment criteria pending final mastectomy pathology report.

## 2019-01-07 NOTE — Progress Notes (Signed)
01/07/19 0800   Comfort Alteration   Karnofsky Performance Score 90%-can perform normal activity, minor signs of disease   Fatigue 1   Pain Score 3  (intermittent)   Pain Loc BREAST   Pain Intervention 0-none

## 2019-01-09 ENCOUNTER — Encounter (INDEPENDENT_AMBULATORY_CARE_PROVIDER_SITE_OTHER): Payer: Self-pay

## 2019-01-09 ENCOUNTER — Encounter (INDEPENDENT_AMBULATORY_CARE_PROVIDER_SITE_OTHER): Payer: Self-pay | Admitting: Family Medicine

## 2019-01-10 ENCOUNTER — Telehealth (INDEPENDENT_AMBULATORY_CARE_PROVIDER_SITE_OTHER): Payer: Medicare Other | Admitting: Surgery

## 2019-01-10 DIAGNOSIS — D0512 Intraductal carcinoma in situ of left breast: Secondary | ICD-10-CM

## 2019-01-10 NOTE — Progress Notes (Signed)
Treatment Team  Ref Prov:    Primary Care Physician:   Raj Janus, MD Radiology facility: [facility])--[phone] Breast Surgeon : Henderson Baltimore MD  [phone]   Plastic Surgeon none Radiation Onc:  Miguel Dibble MD:  [phone] [address]); Medical Onc:  Laney Pastor, MD [phone] [address] [place])      Breast Cancer Comprehensive Care Plan Summary  Date of diagnosis: 11/03/2018 Event : DCIS ECOG Performance: Grade 0  (Fully active) Genetic Testing :not indicated   Presentation : Abnormal mammogram with microcalcifications Side: left Focality :  unifocal Location: radian 12:00   Biologic Tumor characteristics  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 Ki-67:  DCIS - not indicated Oncotype Dx:  DCIS , not indicated   Estrogen Receptor: positive 19 % Progesterone Receptor: negative Her-2-neu Receptor: DCIS, not indicated Androgen Receptor;  not done   Staging  Tumor Size:   DCIS with <24mm microinvasion Nodes: clinically/US negative Systemic Metastases: clinically negative Stage: Stage0 , Tmic N0 M0   Treatment  Modality Date    Surgery   12/25/18 left, partial mastectomy with needle localization    Radiation     Endocrine     Chemotherapy     Biologic     Clinical trial           [facility]  TELEMEDICINE POST OPERATIVE VISIT NOTE    Telemedicine Documentation Requirements    Originating site (Patient location): patient home  Distant site (Provider location): [facility]  Provider and Title: Henderson Baltimore  Consent obtained: YES/NO: No    Language, if applicable and if translator was required: English    This visit is being conducted via video conferen    Verbal consent has been obtained from the patient to conduct a telephone visit encounter to minimize exposure to COVID-19: yes    Subjective:  Katelyn Caldwell had left partial segmental mastectomy on 12/25/18 . She reports discomfort at the surgical site  and itching related to surgical bra. Denies severe pain, breast swelling and purulent discharge from the incision.   She is otherwise active . She is tolerating her diet well. Pathology results as below.    Limited Exam:  Clinically, she is doing well   She reports no redness at incision site. She does have dry skin and some itching in the surrounding area. Steri strips are in place.     Assessment and Plan:  Clinically, she is doing well.  We have reviewed the results of the pathology with the patient. I have reviewed her mammogram  At this point, I would recommend that we set her up for a re-excision  She is also encouraged to follow up with medical and radiation oncology.   We have discussed the planned reexcision at length.  The patient and her husband seem to understand would like to proceed in this manner.

## 2019-01-10 NOTE — H&P (View-Only) (Signed)
Treatment Team  Ref Prov:    Primary Care Physician:   Vejcik, Scott M, MD Radiology facility: [facility] ([facility])--[phone] Breast Surgeon : Samyria Rudie MD  [phone]   Plastic Surgeon none Radiation Onc:  Stella Hetelekidis MD:  [phone] ([place]); Medical Onc:  Kathleen Harnden, MD [phone] ([place])        Breast Cancer Comprehensive Care Plan Summary  Date of diagnosis: 11/03/2018 Event : DCIS ECOG Performance: Grade 0  (Fully active) Genetic Testing :not indicated   Presentation : Abnormal mammogram with microcalcifications Side: left Focality :  unifocal Location: radian 12:00   Biologic Tumor characteristics  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 Ki-67:  DCIS - not indicated Oncotype Dx:  DCIS , not indicated   Estrogen Receptor: positive 19 % Progesterone Receptor: negative Her-2-neu Receptor: DCIS, not indicated Androgen Receptor;  not done   Staging  Tumor Size:   DCIS with <1mm microinvasion Nodes: clinically/US negative Systemic Metastases: clinically negative Stage: Stage0 , Tmic N0 M0   Treatment  Modality Date     Surgery    12/25/18 left, partial mastectomy with needle localization    Radiation       Endocrine       Chemotherapy       Biologic       Clinical trial             [name]  TELEMEDICINE POST OPERATIVE VISIT NOTE    Telemedicine Documentation Requirements    Originating site (Patient location): patient home  Distant site (Provider location): [facility]  Provider and Title: Marrio Scribner  Consent obtained: YES/NO: No    Language, if applicable and if translator was required: English    This visit is being conducted via video conferen    Verbal consent has been obtained from the patient to conduct a telephone visit encounter to minimize exposure to COVID-19: yes    Subjective:  Katelyn Caldwell had left partial segmental mastectomy on 12/25/18 . She reports discomfort at the surgical site  and itching related to surgical bra. Denies severe pain, breast swelling and purulent discharge from the incision.   She is otherwise active . She is tolerating her diet well. Pathology results as below.    Limited Exam:  Clinically, she is doing well   She reports no redness at incision site. She does have dry skin and some itching in the surrounding area. Steri strips are in place.     Assessment and Plan:  Clinically, she is doing well.  We have reviewed the results of the pathology with the patient. I have reviewed her mammogram  At this point, I would recommend that we set her up for a re-excision  She is also encouraged to follow up with medical and radiation oncology.   We have discussed the planned reexcision at length.  The patient and her husband seem to understand would like to proceed in this manner.

## 2019-01-14 ENCOUNTER — Telehealth: Payer: Medicare Other

## 2019-01-14 ENCOUNTER — Encounter (HOSPITAL_BASED_OUTPATIENT_CLINIC_OR_DEPARTMENT_OTHER): Payer: Self-pay
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
## 2019-01-14 NOTE — Progress Notes (Signed)
Study Name: DCISion RT  Protocol Number: [id number]   [facility] #[id number]    [name] initially consented to the DCISion RT research study. However, subject deemed ineligible for enrollment based on final mastectomy pathology results. Subject also no longer eligible as the decision against ordering the DCISion RT test was communicated today to research team by Dr. Dorene Grebe. Subject "screen failed" in DCISion RT database.

## 2019-01-14 NOTE — Pre-Procedure Instructions (Signed)
·   12/25/18 - Last surgery at [facility] - labs, notes, clearances in Epic and noted by team nav   DOS Comment: OSA

## 2019-01-15 ENCOUNTER — Encounter (HOSPITAL_BASED_OUTPATIENT_CLINIC_OR_DEPARTMENT_OTHER)
Admission: RE | Disposition: A | Discharge status: Home / Self Care | Payer: Self-pay | Source: Ambulatory Visit | Attending: Surgery

## 2019-01-15 ENCOUNTER — Ambulatory Visit (HOSPITAL_BASED_OUTPATIENT_CLINIC_OR_DEPARTMENT_OTHER): Payer: Medicare Other | Admitting: Anesthesiology

## 2019-01-15 ENCOUNTER — Ambulatory Visit
Admission: RE | Admit: 2019-01-15 | Discharge: 2019-01-15 | Disposition: A | Discharge status: Home / Self Care | Payer: Medicare Other | Source: Ambulatory Visit | Attending: Surgery | Admitting: Surgery

## 2019-01-15 ENCOUNTER — Ambulatory Visit: Payer: Medicare Other

## 2019-01-15 ENCOUNTER — Encounter (HOSPITAL_BASED_OUTPATIENT_CLINIC_OR_DEPARTMENT_OTHER): Payer: Self-pay | Admitting: Anesthesiology

## 2019-01-15 DIAGNOSIS — C50912 Malignant neoplasm of unspecified site of left female breast: Secondary | ICD-10-CM | POA: Insufficient documentation

## 2019-01-15 DIAGNOSIS — Z7984 Long term (current) use of oral hypoglycemic drugs: Secondary | ICD-10-CM | POA: Insufficient documentation

## 2019-01-15 DIAGNOSIS — C50812 Malignant neoplasm of overlapping sites of left female breast: Secondary | ICD-10-CM

## 2019-01-15 DIAGNOSIS — I129 Hypertensive chronic kidney disease with stage 1 through stage 4 chronic kidney disease, or unspecified chronic kidney disease: Secondary | ICD-10-CM | POA: Insufficient documentation

## 2019-01-15 DIAGNOSIS — G4730 Sleep apnea, unspecified: Secondary | ICD-10-CM | POA: Insufficient documentation

## 2019-01-15 DIAGNOSIS — E785 Hyperlipidemia, unspecified: Secondary | ICD-10-CM | POA: Insufficient documentation

## 2019-01-15 DIAGNOSIS — K219 Gastro-esophageal reflux disease without esophagitis: Secondary | ICD-10-CM | POA: Insufficient documentation

## 2019-01-15 DIAGNOSIS — D0512 Intraductal carcinoma in situ of left breast: Secondary | ICD-10-CM | POA: Diagnosis present

## 2019-01-15 DIAGNOSIS — M300 Polyarteritis nodosa: Secondary | ICD-10-CM | POA: Insufficient documentation

## 2019-01-15 DIAGNOSIS — E1122 Type 2 diabetes mellitus with diabetic chronic kidney disease: Secondary | ICD-10-CM | POA: Insufficient documentation

## 2019-01-15 DIAGNOSIS — Z170 Estrogen receptor positive status [ER+]: Secondary | ICD-10-CM | POA: Insufficient documentation

## 2019-01-15 DIAGNOSIS — N183 Chronic kidney disease, stage 3 (moderate): Secondary | ICD-10-CM | POA: Insufficient documentation

## 2019-01-15 HISTORY — PX: BREAST, LUMPECTOMY: SHX3314

## 2019-01-15 HISTORY — DX: Cardiac arrhythmia, unspecified: I49.9

## 2019-01-15 HISTORY — DX: Gastro-esophageal reflux disease without esophagitis: K21.9

## 2019-01-15 LAB — GLUCOSE WHOLE BLOOD - POCT
Whole Blood Glucose POCT: 133 mg/dL — ABNORMAL HIGH (ref 70–100)
Whole Blood Glucose POCT: 122 mg/dL — ABNORMAL HIGH (ref 70–100)

## 2019-01-15 SURGERY — BREAST, LUMPECTOMY
Anesthesia: Anesthesia General | Site: Breast | Laterality: Left | Wound class: Clean

## 2019-01-15 MED ORDER — HYDROMORPHONE HCL 2 MG PO TABS
ORAL_TABLET | ORAL | Status: AC
Start: 2019-01-15 — End: ?
  Filled 2019-01-15: qty 1

## 2019-01-15 MED ORDER — CEFAZOLIN SODIUM-DEXTROSE 2-3 GM-%(50ML) IV SOLR
2.0000 g | Freq: Once | INTRAVENOUS | Status: AC
Start: 2019-01-15 — End: 2019-01-15
  Administered 2019-01-15: 10:00:00 2 g via INTRAVENOUS

## 2019-01-15 MED ORDER — CEFAZOLIN SODIUM-DEXTROSE 2-3 GM-%(50ML) IV SOLR
INTRAVENOUS | Status: AC
Start: 2019-01-15 — End: ?
  Filled 2019-01-15: qty 50

## 2019-01-15 MED ORDER — PROPOFOL 10 MG/ML IV EMUL (WRAP)
INTRAVENOUS | Status: AC
Start: 2019-01-15 — End: ?
  Filled 2019-01-15: qty 20

## 2019-01-15 MED ORDER — SODIUM CHLORIDE BACTERIOSTATIC 0.9 % IJ SOLN
INTRAMUSCULAR | Status: AC
Start: 2019-01-15 — End: ?
  Filled 2019-01-15: qty 30

## 2019-01-15 MED ORDER — LACTATED RINGERS IV SOLN
INTRAVENOUS | Status: DC
Start: 2019-01-15 — End: 2019-01-15
  Administered 2019-01-15: 10:00:00 20 mL/h via INTRAVENOUS

## 2019-01-15 MED ORDER — ONDANSETRON HCL 4 MG/2ML IJ SOLN
INTRAMUSCULAR | Status: AC
Start: 2019-01-15 — End: ?
  Filled 2019-01-15: qty 2

## 2019-01-15 MED ORDER — BUPIVACAINE-EPINEPHRINE (PF) 0.5% -1:200000 IJ SOLN
INTRAMUSCULAR | Status: AC
Start: 2019-01-15 — End: ?
  Filled 2019-01-15: qty 10

## 2019-01-15 MED ORDER — LIDOCAINE HCL 1 % IJ SOLN
INTRAMUSCULAR | Status: DC | PRN
Start: 2019-01-15 — End: 2019-01-15
  Administered 2019-01-15: 20 mL

## 2019-01-15 MED ORDER — FAMOTIDINE 10 MG/ML IV SOLN (WRAP)
INTRAVENOUS | Status: DC | PRN
Start: 2019-01-15 — End: 2019-01-15
  Administered 2019-01-15: 20 mg via INTRAVENOUS

## 2019-01-15 MED ORDER — OXYCODONE-ACETAMINOPHEN 5-325 MG PO TABS
1.0000 | ORAL_TABLET | Freq: Once | ORAL | Status: DC | PRN
Start: 2019-01-15 — End: 2019-01-15

## 2019-01-15 MED ORDER — FENTANYL CITRATE (PF) 50 MCG/ML IJ SOLN (WRAP)
INTRAMUSCULAR | Status: DC | PRN
Start: 2019-01-15 — End: 2019-01-15
  Administered 2019-01-15 (×2): 25 ug via INTRAVENOUS

## 2019-01-15 MED ORDER — ONDANSETRON HCL 4 MG/2ML IJ SOLN
INTRAMUSCULAR | Status: DC | PRN
Start: 2019-01-15 — End: 2019-01-15
  Administered 2019-01-15: 4 mg via INTRAVENOUS

## 2019-01-15 MED ORDER — HYDROMORPHONE HCL 0.5 MG/0.5 ML IJ SOLN
0.5000 mg | INTRAMUSCULAR | Status: DC | PRN
Start: 2019-01-15 — End: 2019-01-15

## 2019-01-15 MED ORDER — BACITRACIN 50000 UNITS IM SOLR
INTRAMUSCULAR | Status: AC
Start: 2019-01-15 — End: ?
  Filled 2019-01-15: qty 50000

## 2019-01-15 MED ORDER — HYDROMORPHONE HCL 2 MG PO TABS
2.0000 mg | ORAL_TABLET | Freq: Once | ORAL | Status: AC | PRN
Start: 2019-01-15 — End: 2019-01-15
  Administered 2019-01-15: 2 mg via ORAL

## 2019-01-15 MED ORDER — DIPHENHYDRAMINE HCL 50 MG/ML IJ SOLN
25.0000 mg | Freq: Four times a day (QID) | INTRAMUSCULAR | Status: DC | PRN
Start: 2019-01-15 — End: 2019-01-15

## 2019-01-15 MED ORDER — PROPOFOL INFUSION 10 MG/ML
INTRAVENOUS | Status: DC | PRN
Start: 2019-01-15 — End: 2019-01-15
  Administered 2019-01-15: 50 ug/kg/min via INTRAVENOUS

## 2019-01-15 MED ORDER — FAMOTIDINE 20 MG/2ML IV SOLN
INTRAVENOUS | Status: AC
Start: 2019-01-15 — End: ?
  Filled 2019-01-15: qty 2

## 2019-01-15 MED ORDER — LIDOCAINE HCL (PF) 1 % IJ SOLN
INTRAMUSCULAR | Status: AC
Start: 2019-01-15 — End: ?
  Filled 2019-01-15: qty 10

## 2019-01-15 MED ORDER — LIDOCAINE HCL (PF) 2 % IJ SOLN
INTRAMUSCULAR | Status: DC | PRN
Start: 2019-01-15 — End: 2019-01-15
  Administered 2019-01-15: 40 mg via INTRAVENOUS

## 2019-01-15 MED ORDER — OXYCODONE HCL 5 MG PO TABS
5.0000 mg | ORAL_TABLET | ORAL | 0 refills | Status: AC | PRN
Start: 2019-01-15 — End: 2019-01-22

## 2019-01-15 MED ORDER — ACETAMINOPHEN 500 MG PO TABS
1000.0000 mg | ORAL_TABLET | Freq: Once | ORAL | Status: DC | PRN
Start: 2019-01-15 — End: 2019-01-15

## 2019-01-15 MED ORDER — LACTATED RINGERS IV SOLN
50.0000 mL/h | INTRAVENOUS | Status: DC
Start: 2019-01-15 — End: 2019-01-15

## 2019-01-15 MED ORDER — GLYCOPYRROLATE 0.2 MG/ML IJ SOLN
INTRAMUSCULAR | Status: DC | PRN
Start: 2019-01-15 — End: 2019-01-15
  Administered 2019-01-15: 0.1 mg via INTRAVENOUS

## 2019-01-15 MED ORDER — ACETAMINOPHEN 500 MG PO TABS
1000.0000 mg | ORAL_TABLET | Freq: Once | ORAL | Status: AC
Start: 2019-01-15 — End: 2019-01-15
  Administered 2019-01-15: 09:00:00 1000 mg via ORAL

## 2019-01-15 MED ORDER — ACETAMINOPHEN 500 MG PO TABS
ORAL_TABLET | ORAL | Status: AC
Start: 2019-01-15 — End: ?
  Filled 2019-01-15: qty 2

## 2019-01-15 MED ORDER — GLYCOPYRROLATE 0.2 MG/ML IJ SOLN
INTRAMUSCULAR | Status: AC
Start: 2019-01-15 — End: ?
  Filled 2019-01-15: qty 1

## 2019-01-15 MED ORDER — SODIUM CHLORIDE 0.9 % IV SOLN
INTRAVENOUS | Status: DC | PRN
Start: 2019-01-15 — End: 2019-01-15
  Administered 2019-01-15: 11:00:00 1000 mL

## 2019-01-15 MED ORDER — METOCLOPRAMIDE HCL 5 MG/ML IJ SOLN
10.0000 mg | Freq: Once | INTRAMUSCULAR | Status: DC | PRN
Start: 2019-01-15 — End: 2019-01-15

## 2019-01-15 MED ORDER — PROPOFOL INFUSION 10 MG/ML
INTRAVENOUS | Status: DC | PRN
Start: 2019-01-15 — End: 2019-01-15
  Administered 2019-01-15: 40 mg via INTRAVENOUS
  Administered 2019-01-15: 160 mg via INTRAVENOUS

## 2019-01-15 MED ORDER — BUPIVACAINE HCL (PF) 0.5 % IJ SOLN
INTRAMUSCULAR | Status: AC
Start: 2019-01-15 — End: ?
  Filled 2019-01-15: qty 10

## 2019-01-15 MED ORDER — LIDOCAINE HCL (PF) 2 % IJ SOLN
INTRAMUSCULAR | Status: AC
Start: 2019-01-15 — End: ?
  Filled 2019-01-15: qty 5

## 2019-01-15 MED ORDER — BUPIVACAINE-EPINEPHRINE (PF) 0.5% -1:200000 IJ SOLN
INTRAMUSCULAR | Status: DC | PRN
Start: 2019-01-15 — End: 2019-01-15
  Administered 2019-01-15: 20 mL via INTRAMUSCULAR

## 2019-01-15 MED ORDER — AMMONIA AROMATIC IN INHA
1.0000 | Freq: Once | RESPIRATORY_TRACT | Status: DC | PRN
Start: 2019-01-15 — End: 2019-01-15

## 2019-01-15 MED ORDER — MEPERIDINE HCL 25 MG/ML IJ SOLN
12.5000 mg | Freq: Once | INTRAMUSCULAR | Status: DC
Start: 2019-01-15 — End: 2019-01-15

## 2019-01-15 MED ORDER — FENTANYL CITRATE (PF) 50 MCG/ML IJ SOLN (WRAP)
25.0000 ug | INTRAMUSCULAR | Status: DC | PRN
Start: 2019-01-15 — End: 2019-01-15

## 2019-01-15 MED ORDER — ONDANSETRON HCL 4 MG/2ML IJ SOLN
4.0000 mg | Freq: Once | INTRAMUSCULAR | Status: DC | PRN
Start: 2019-01-15 — End: 2019-01-15

## 2019-01-15 MED ORDER — PROPOFOL 10 MG/ML IV EMUL (WRAP)
INTRAVENOUS | Status: AC
Start: 2019-01-15 — End: ?
  Filled 2019-01-15: qty 100

## 2019-01-15 SURGICAL SUPPLY — 96 items
ADHESIVE SKIN CLOSURE DERMABOND ADVANCED (Skin Closure) ×1
ADHESIVE SKIN CLOSURE DERMABOND ADVANCED .7 ML LIQUID APPLICATOR (Skin Closure) ×1 IMPLANT
ADHESIVE SKIN DERMABOND ADV (Skin Closure) ×1
APPLIER INTERNAL CLIP L9.75 IN AUTOMATIC (Procedure Accessories) ×1
APPLIER INTERNAL CLIP L9.75 IN AUTOMATIC PREMIUM SURGICLIP II SUPER (Procedure Accessories) IMPLANT
APPLIER INTERNAL CLIP MEDIUM L9.3 IN 20 (Clips)
APPLIER INTERNAL CLIP MEDIUM L9.3 IN 20 MULTIPLE OPEN LIGATE LIGACLIP (Clips) IMPLANT
BLADE BOVIE ELCTRD (Cautery)
BRA SURGICAL SUPPORT SM (Patient Supply) ×1
BULB DRAINAGE LIGHTWEIGHT LOW LEVEL (Drain)
BULB DRAINAGE LIGHTWEIGHT LOW LEVEL SUCTION RELIAVAC SILICONE 100 CC (Drain) IMPLANT
CLEANER ELECTROSURGICAL TIP PENCIL (Procedure Accessories)
CLEANER ELECTROSURGICAL TIP PENCIL HANDSWITCH LECTROBRASIVE (Procedure Accessories) IMPLANT
CLOSURE STERI-STRIP 1X5IN (Dressing) ×1
CNTNRW LID CLR 8OZ (Suction) ×3
CONTAINER SPEC 8OZ NS SNPON LID TRNLU (Suction) ×3 IMPLANT
COVER FLEXIBLE LIGHT HANDLE PLASTIC GREEN (Procedure Accessories) ×2 IMPLANT
COVER FLEXIBLE MEDLINE LIGHT HANDLE (Procedure Accessories) ×2
CVR LGHTHNDL FLEX SFT 1PK (Procedure Accessories) ×2
DRAN EVACUATOR WOUND 100CC (Drain)
DRAPE 96X5IN ISOSILK SMALL T TIP BAND TAPE STRIP GEL PROBE LATEX FREE (Procedure Accessories) ×1 IMPLANT
DRAPE PRB ISOSILK 96X5IN LF STRL SM T (Procedure Accessories) ×1
DRESSING TEGADERM 4X4X3/4IN (Dressing) ×1
DRESSING TRANSPARENT L4 3/4 IN X W4 IN (Dressing) ×1
DRESSING TRANSPARENT L4 3/4 IN X W4 IN POLYURETHANE ADHESIVE (Dressing) ×1 IMPLANT
ELECTRODE ADULT PATIENT RETURN L9 FT REM POLYHESIVE ACRYLIC FOAM (Procedure Accessories) ×1 IMPLANT
ELECTRODE ELECTROSURGICAL BLADE L2.4 IN (Cautery)
ELECTRODE ELECTROSURGICAL BLADE L2.4 IN OD3/32 IN VALLEYLAB STAINLESS (Cautery) IMPLANT
ELECTRODE PATIENT RETURN L9 FT VALLEYLAB (Procedure Accessories) ×1
GAUZE SPONGE 4X4 NS (Dressing) ×1
GAUZE SPONGE VERSALON 4PLY 4X4 (Dressing) ×1
GLOVE SURGICAL 7 BIOGEL PI MICRO POWDER (Glove) ×1
GLOVE SURGICAL 7 BIOGEL PI MICRO POWDER FREE BEAD CUFF MICRO ROUGHEN (Glove) ×1 IMPLANT
GLOVES BIOGEL PI MICRO SZ 7 (Glove) ×1
LIGACLIP MEDIUM 9 3/8 INCHES (Clips)
NEEDLE  LOC BREAST  20GX7CM (Needles)
NEEDLE  LOC BREAST 20GX5CM (Needles) ×2
NEEDLE 25GA 1 1/2 (Needles) ×1
NEEDLE HPO PP RW SFGLD 22GA 1.5IN LF (Needles) ×3 IMPLANT
NEEDLE HPO SS PP RW BD 25GA 1.5IN LF (Needles) ×1 IMPLANT
NEEDLE INJ SFTY 22GX1.5IN (Needles) ×3
NEEDLE LCLZTN BREAST BIOPSY THRD WR ENTRY STFFND PRLD KOPANS 7CMX20GA (Needles) IMPLANT
NEEDLE LOC BREAST 20GX5CM (Needles) ×2
NEEDLE LOC BREAST 20GX7CM (Needles)
NEEDLE LOCALIZATION L5 CM OD20 GA KOPANS EASY THREAD WIRE STIFFEN (Needles) ×2 IMPLANT
PACK SRG LF STRL MAJ BRST WOM DISP (Pack) ×1
PAD ARMBOARD FOAM 20X8X2IN (Positioning Supplies) ×1
PAD ARMBOARD L20 IN X W8 IN X H2 IN (Positioning Supplies) ×1
PAD ARMBOARD L20 IN X W8 IN X H2 IN CONVOLUTE FOAM PURPLE (Positioning Supplies) ×1 IMPLANT
PAD ELECTROSRG GRND REM W CRD (Procedure Accessories) ×1
PAD PREP CUFF 24X41IN W 9IN (Prep) ×1
PAD SRGPRP 44X24IN NS CUF 9IN (Prep) ×1 IMPLANT
PAD VALLEYLAB SCRATCH 5.08CM (Procedure Accessories)
POSITIONER HEAD FOAM 7X2", LF (Positioning Supplies) ×1
POSITIONER OR HIGH RESILIENT CUSHION (Positioning Supplies) ×1
POSITIONER OR HIGH RESILIENT CUSHION MULTIRING FOAM HEAD RASPBERRY (Positioning Supplies) ×1 IMPLANT
PROBE COVER T SHAPE LATEX FREE (Procedure Accessories) ×1
PROBE MARGINPROBE LUMPCTMY (Procedure Accessories) ×2
PROBE MARGINPROBE LUMPECOTMY (Procedure Accessories) ×1 IMPLANT
SOL NACL.9% 1000ML IRR NONLTX (Irrigation Solutions) ×1
SOLUTION BETADINE 4OZ (Scrub Supplies) ×1
SOLUTION IRRIGATION 0.9% SODIUM CHLORIDE (Irrigation Solutions) ×1
SOLUTION IRRIGATION 0.9% SODIUM CHLORIDE 1000 ML PLASTIC POUR BOTTLE (Irrigation Solutions) ×1 IMPLANT
SOLUTION SRGSCRB 10% PVP IOD 4OZ PLS BTL (Scrub Supplies) ×1
SOLUTION SURGICAL SCRUB 10% PVP IODINE 4OZ PLASTIC BOTTLE AQUEOUS SKIN (Scrub Supplies) ×1 IMPLANT
SPONGE GAUZE L4 IN X W4 IN 16 PLY (Dressing) ×1
SPONGE GAUZE L4 IN X W4 IN 16 PLY MAXIMUM ABSORBENT USP TYPE VII (Dressing) ×1 IMPLANT
SPONGE GAUZE L4 IN X W4 IN 4 PLY HIGH (Dressing) ×1
SPONGE GAUZE L4 IN X W4 IN 4 PLY HIGH ABSORBENT NONWOVEN LINT FREE (Dressing) IMPLANT
STRIP SKIN CLOSURE L5 IN X W1 IN (Dressing) ×1
STRIP SKIN CLOSURE L5 IN X W1 IN REINFORCE STERI-STRIP POLYESTER WHITE (Dressing) ×1 IMPLANT
SUPPORT MAMMARY 34-36 IN SMALL PAD (Patient Supply) ×1
SUPPORT MAMMARY 34-36 IN SMALL PAD SHOULDER STRAP REMOVABLE DRAIN BULB (Patient Supply) ×1 IMPLANT
SURGICAL BRA LARGE (Procedure Accessories) ×2 IMPLANT
SURGICAL BRA MEDIUM (Procedure Accessories) ×2 IMPLANT
SURGICAL BRA XLARG (Procedure Accessories) ×2 IMPLANT
SURGICLIP PREMIUM II M-9.75 (Procedure Accessories) ×1
SUT VICRYL 3-0 SH 27IN (Suture) ×1
SUTURE COATED VICRYL 3-0 SH L27 IN BRAID (Suture) ×1 IMPLANT
SUTURE MONOCRYL 4-0 PS-2 L27 IN (Suture) ×1
SUTURE MONOCRYL 4-0 PS-2 L27 IN MONOFILAMENT UNDYED ABSORBABLE (Suture) ×1 IMPLANT
SUTURE MONOCRYL 4-0 PS2 27IN (Suture) ×1
SUTURE PDS II 2-0 SH L27 IN MONOFILAMENT (Suture)
SUTURE PDS II 2-0 SH L27 IN MONOFILAMENT VIOLET ABSORBABLE (Suture) IMPLANT
SUTURE PDS II 2-0 SH2 7IN (Suture)
SUTURE SILK 3-0 (Suture) ×1
SUTURE SILK PERMA HAND BLACK 3-0 SH L30 (Suture) ×1
SUTURE SILK PERMA HAND BLACK 3-0 SH L30 IN BRAID NONABSORBABLE (Suture) ×1 IMPLANT
TAPE MICROFOAM 2INX5.5YD (Tape)
TAPE SURGICAL L5 1/2 YD X W2 IN (Tape)
TAPE SURGICAL L5 1/2 YD X W2 IN HYPOALLERGENIC WATER RESISTANT (Tape) IMPLANT
TRAY DRY SKIN SCRUB (Prep) ×1
TRAY MAJOR BREAST WOMENS (Pack) ×1
TRAY SKIN SCRUB L8 IN 6 WING 6 SPONGE STICK 2 TIP APPLICATOR DRY VINYL (Prep) ×1 IMPLANT
TRAY SKIN SCRUB MEDLINE L8 IN VINYL (Prep) ×1
TRAY SURGICAL MAJOR BREAST (Pack) ×1 IMPLANT

## 2019-01-15 NOTE — Anesthesia Postprocedure Evaluation (Signed)
Anesthesia Post Evaluation    Patient: Katelyn Caldwell    Procedure(s) with comments:  BREAST, LUMPECTOMY - LEFT BREAST LUMPECTOMY ( RE-EXCISION)    Anesthesia type: general    Last Vitals:   Vitals Value Taken Time   BP 154/81 01/15/2019 12:10 PM   Temp 36.3 C (97.4 F) 01/15/2019 11:40 AM   Pulse 63 01/15/2019 12:10 PM   Resp 16 01/15/2019 12:10 PM   SpO2 97 % 01/15/2019 12:10 PM       Anesthesia Post Evaluation:     Patient Evaluated: PACU  Patient Participation: complete - patient participated  Level of Consciousness: awake  Pain Score: 2  Pain Management: adequate    Airway Patency: patent    Anesthetic complications: No      PONV Status: none    Cardiovascular status: acceptable  Respiratory status: acceptable  Hydration status: acceptable        Anesthesia Qualified Clinical Data Registry 2018    PACU Reintubation  Did the Patient have general anesthesia with intubation: No        PONV Adult  Is the patient aged 23 or older: Yes  Did the patient receive recieve a general anesthestic: Yes  Does the patient have 3 or more risk factors for PONV? No  Did the patient receive anti-emetics from at least two classes of medications? Yes      PONV Pediatric  Is the patient aged 81-17? No            PACU Transfer Checklist Protocol  Was the patient transferred to the PACU at the conclusion of surgery? Yes  Was a checklist or transfer protocol used? Yes    ICU Transfer Checklist Protocol  Was the patient transferred to the ICU at the conclusion of surgery? No      Post-op Pain Assessment Prior to Anesthesia Care End  Age >=18 and assessed for pain in PACU: Yes  Pacu pain score <7/10: Yes      Perioperative Mortality  Perioperative mortality prior to Anesthesia end time: No    Perioperative Cardiac Arrest  Did the patient have an unanticipated intraoperative cardiac arrest between anesthesia start time and anesthesia end time? No    Unplanned Admission to ICU  Did the patient have an unplanned admission to the ICU (not  initially anticipated at anesthesia start time)? No      Signed by: Dyanne Carrel, 01/15/2019 12:27 PM

## 2019-01-15 NOTE — Op Note (Signed)
Dictated

## 2019-01-15 NOTE — Brief Op Note (Signed)
BRIEF OP NOTE    Date Time: 01/15/19 11:46 AM    Patient Name:   Katelyn Caldwell    Date of Operation:   01/15/2019    Providers Performing:   Surgeon(s):  Kamaiya Antilla, Rochele Raring, MD    Assistant (s):   Circulator: Jenetta Loges, RN  Scrub Person: Lovena Neighbours  First Assistant: Larena Glassman, RN    Operative Procedure:   Left Re-excision Lumpectomy    Preoperative Diagnosis:   Pre-Op Diagnosis Codes:     * Ductal carcinoma in situ of left breast [D05.12]    Postoperative Diagnosis:   Post-Op Diagnosis Codes:     * Ductal carcinoma in situ of left breast [D05.12]    Anesthesia:   General    Estimated Blood Loss:    * No values recorded between 01/15/2019 10:25 AM and 01/15/2019 11:36 AM *    Implants:   * No implants in log *    Drains:   Drains: no    Specimens:   * No specimens in log *     SPECIMENS (last 24 hours)      Pathology Specimens     Row Name 01/15/19 1100                Additional Information    Send final report to:  Dr Cyndi Bender          Specimen Information    Specimen Testing Required  Routine Pathology       Specimen ID   A       Specimen Description  Left breast superior anterior margin, stitch at superior margin       Breast Tissue?  yes       Excision Time  1051       Time in Formalin  1108          Specimen Information    Specimen Testing Required  Routine Pathology       Specimen ID   B       Specimen Description  Left breast new lateral margin, stitch at new lateral margin       Breast Tissue?  Yes       Excision Time  1056       Time in Formalin  1107          Specimen Information    Specimen Testing Required  Routine Pathology       Specimen ID   C       Specimen Description  Left breast new inferior margin,stitch at new inferior margin       Breast Tissue?  Yes       Excision Time  1058       Time in Formalin  1107             Findings:   Microcalcification in specimen along with clip.    Complications:   None      Signed by: Boykin Peek, MD                                                                            [facility] OR

## 2019-01-15 NOTE — Anesthesia Preprocedure Evaluation (Signed)
Anesthesia Evaluation    AIRWAY    Mallampati: II    TM distance: >3 FB  Neck ROM: full  Mouth Opening:full   CARDIOVASCULAR    cardiovascular exam normal       DENTAL         PULMONARY    pulmonary exam normal     OTHER FINDINGS                  Relevant Problems   CARDIO   (+) Essential hypertension   (+) Premature ventricular contractions (PVCs) (VPCs)      GI   (+) Gastroesophageal reflux disease without esophagitis      GU/RENAL   (+) Chronic kidney disease, stage III (moderate)      ENDO   (+) Diabetes mellitus   (+) Type 2 diabetes mellitus without complication, without long-term current use of insulin               Anesthesia Plan    ASA 3     general                                 informed consent obtained                   Signed by: Dyanne Carrel 01/15/19 8:33 AM

## 2019-01-15 NOTE — Transfer of Care (Signed)
Anesthesia Transfer of Care Note    Patient: Katelyn Caldwell    Procedures performed: Procedure(s) with comments:  BREAST, LUMPECTOMY - LEFT BREAST LUMPECTOMY ( RE-EXCISION)    Anesthesia type: General LMA    Patient location:Gyn PACU    Last vitals:   Vitals:    01/15/19 1140   BP: 141/74   Pulse: 71   Resp: 16   Temp: 36.3 C (97.4 F)   SpO2: 100%       Post pain: Patient not complaining of pain, continue current therapy      Mental Status:drowsy    Respiratory Function: tolerating face mask    Cardiovascular: stable    Nausea/Vomiting: patient not complaining of nausea or vomiting    Hydration Status: adequate    Post assessment: no apparent anesthetic complications and no reportable events    Signed by: Mathews Robinsons  01/15/19 11:40 AM

## 2019-01-15 NOTE — Op Note (Signed)
Procedure Date: 01/15/2019     Patient Type: A     SURGEON: Boykin Peek MD  ASSISTANT:       ASSISTANTRoseanna Rainbow Fischer, MD     PREOPERATIVE DIAGNOSIS:  Left breast ductal carcinoma in situ with microinvasion.     POSTOPERATIVE DIAGNOSIS:  Left breast ductal carcinoma in situ with microinvasion.     TITLE OF PROCEDURE:  Left reexcision lumpectomy.      ANESTHESIA:  General.     ESTIMATED BLOOD LOSS:  Minimal.     FLUIDS:  Crystalloids.     COMPLICATIONS:  None.     INDICATIONS FOR PROCEDURE:  This is a 68 year old who presented originally for a left lumpectomy for  DCIS with microinvasion.  She was noted to have 1 residual  microcalcification and close margins.  We are doing this procedure to  re-excise this area.     DESCRIPTION OF PROCEDURE:  The patient was seen in the preoperative holding area.  The planned  procedure was again discussed along with the risks, benefits, options, and  alternatives.  The patient was brought back to the operating room, placed  on the operating table in the usual supine position.  After the appropriate  monitoring devices were applied, the patient underwent general anesthesia.   DVT prophylaxis was assured.  IV antibiotics were administered, and a  surgical pause was performed.  Using focus target, the patient's left  breast, chest and axilla were prepped and draped in the usual sterile  manner.   A local field block was established using 1% lidocaine and 0.5%  Marcaine.  The previous periareolar incision was made and carried down  through the skin and subcutaneous tissue.  The cavity was identified.  The  previously mobilized breast parenchyma was reopened by giving the PDS  suture.  The superior and anterior aspect was then re-excised.  A specimen  mammogram confirmed the presence of the residual microcalcification.  The  deep margin was the pectoralis.  The lateral inferior tissue was then also  completely re-excised.  The inferior tissue also extended medially.  The  breast  parenchyma was then further mobilized from the overlying skin.  It  was reapproximated using 3-0 PDS.  The skin was redraped and closed in 2  layers with interrupted 3-0 Vicryl and subcuticular 4-0 PDS.  The wound was  dressed in the usual sterile manner.  The patient tolerated the procedure  well.  The patient was awoken and transferred to the recovery room.           D:  01/15/2019 11:52 AM by Dr. Rochele Raring. Shaneece Stockburger, MD [id number])  T:  01/15/2019 12:06 PM by NTS      Everlean Cherry: [id number]) (Doc ID: [id number])

## 2019-01-15 NOTE — Discharge Instr - AVS First Page (Addendum)
[facility])  Henderson Baltimore, MD, [facility]   Discharge Instructions After Lumpectomy/Excisional Biopsy       Admit Date: 01/15/2019   Discharge Date: 01/15/2019     Attending: Boykin Peek, MD   Surgeon: Surgeon(s):  Edmiston, Rochele Raring, MD      Procedures:  Procedure(s) (LRB):  BREAST, LUMPECTOMY (Left)      Wound Care:    You will have one or more incisions on your breast  .  All of your stitches are buried under the skin and human super glue or steri strips are used to seal the skin. You may have some bruising after surgery.  This is normal.  ? You may have either an ace-bandage around your chest or a supportive bra placed at the time of surgery. These may be removed after 48 hrs. Use a supportive (sports bra) afterwards for comfort as needed.  ? You can shower 48 hrs after your surgery and get the wounds wet. You may use soap as well.  ? There may be a small amount of drainage from your wounds, this is normal and nothing to worry about.  Place a dry gauze or bandage (available at any drug store) on the wound to absorb any drainage.    For pain:    People experience different types and amount of pain or discomfort after surgery. The goal of pain management is to assess your own level of discomfort and to use local measures or medication as it is needed. You will have better results controlling your pain before your pain is severe.  ? The less your breast moves the less it will hurt. You will have either an ace- bandage around your chest or a supportive bra placed at the time of surgery.   ? An ice pack applied to the surgery areas may be helpful to decrease discomfort and swelling. A small pillow positioned in the armpit may also decrease discomfort.  ? You may take Tylenol 650 mg every 4 -6 hours as needed for pain and/or  ? Ibuprofen (Advil or Motrin) 600 mg (200mg  x 3) for 2 or 3 days as needed. We recommend three tablets with breakfast, lunch, and dinner (three times a day). It is  important to take the Ibuprofen at the time of your meals to avoid irritation of you stomach.  ? We prefer to avoid the use of Narcotics (like Vicodin or Percocet) as these often cause a feeling of light-headedness, confusion, unusual thoughts, problems with urination; or nausea, upper stomach pain and constipation.  We will prescribe Roxicodone if you need stronger pain medication.    When to call - 24hrs-7 days/week - Office [phone]    ? If you have severe, uncontrolled pain at the biopsy site.  ? If you run at fever of 101.5?F or higher within a few days of the biopsy.  ? If you have a large amount of drainage that is soaking the bandage more than once a day.  ? If the area around your wound becomes red/inflamed or very swollen.    Please follow up in our office in one week for review of results and follow up exam.      Post Anesthesia Discharge Instructions    Although you may be awake and alert in the recovery room, small amounts of anesthetic remain in your system for about 24 hours.  You may feel tired and sleepy during this time.      You are advised  to go directly home from the hospital.    Plan to stay at home and rest for the remainder of the day.    It is advisable to have someone with you at home for 24 hours after surgery.    Do not operate a motor vehicle, or any mechanical or electrical equipment for the next 24 hours.      Be careful when you are walking around, you may become dizzy.  The effects of anesthesia and/or medications are still present and drowsiness may occur    Do not consume alcohol, tranquilizers, sleeping medications, or any other non prescribed medication for the remainder of the day.    Diet:  begin with liquids, progress your diet as tolerated or as directed by your surgeon.  Nausea and vomiting may occur in the next 24 hours.

## 2019-01-15 NOTE — Interval H&P Note (Signed)
PRESURGICAL CHECKLIST    I have personally reviewed the patient's H and P and all relevant imaging. I have re- examined the patient and marked the appropriate site.  There are no changes to the patient's physical condition or planned procedure.    Exceptions:I I have reviewed the patient's mammogram which demonstrated the single residual microcalcification.  I have also reviewed the results of her previous pathology.  This demonstrated that her invasive carcinoma margins were negative.  The DCIS margins were less than 1 mm at the new lateral margin,  inferior margin and anterior margin.  We will plan to re-excise these margins and the superior aspect which contained the single residual microcalcification.    Cor:RRR  Lungs:CTA    I have discussed the planned procedure along with the risks and benefits with the patient.  All questions were answered.  The written consent was reviewed with the patient and obtained. We will proceed.    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      Each Risk Factor Represents 1 Point.    This patient has:  Lumpectomy/excisional biopsy/simple mastectomy without reconstruction    Each Risk Factor Represents 2 Points  This patient has:  Age 13-74 years  Malignancy (present or previous)    Each Risk Factor Represents 3 Points  This patient has:  None    Each Risk Factor Represents 5 points  This patient has:  None    Total:  5      Based on this  screen, the patient is at Moderate Risk.  We need to weigh the risks and benefits of DVT/PE prophylaxis vs bleeding.    Would recommend:  Total Risk= 0  (<0.1%)       Very low= Early ambulation    Total Risk = 1-2 (0.1%)     Low=  Sequential compression device (SCD)    Total Risk = 3-4 (0.6%) Low/ Moderate= Sequential compression device (SCD)     Total Risk = 5-6 (1.27%) Moderate =Sequential compression device (SCD)    Total Risk = 7-8 (2.5%) High= SCD and Lovenox* (40 mg SQ for 10 days)    Total Risk=>8 (11.3%) High= SCD and Lovenox* ( 40 mg SQ for 10 days)

## 2019-01-16 ENCOUNTER — Encounter (HOSPITAL_BASED_OUTPATIENT_CLINIC_OR_DEPARTMENT_OTHER): Payer: Self-pay | Admitting: Surgery

## 2019-01-17 ENCOUNTER — Telehealth (HOSPITAL_BASED_OUTPATIENT_CLINIC_OR_DEPARTMENT_OTHER): Payer: Self-pay

## 2019-01-18 ENCOUNTER — Encounter (INDEPENDENT_AMBULATORY_CARE_PROVIDER_SITE_OTHER): Payer: Self-pay | Admitting: Family Medicine

## 2019-01-18 ENCOUNTER — Telehealth (HOSPITAL_BASED_OUTPATIENT_CLINIC_OR_DEPARTMENT_OTHER): Payer: Self-pay | Admitting: Family Nurse Practitioner

## 2019-01-18 NOTE — Telephone Encounter (Signed)
Pt called this morning to report that her skin appears to be improving after switching to camisole. She also reports new onset of right heel pain/tendonitis. She is concerned as she is not sure what antibiotics were given in OR and states she has had an issue with tendon pain with ciprofloxacin previously. Let her know that she was given Ancef in OR. She is unable to take NSAIDs. Recommend she rest, ice, elevate. As far as postoperative mobility, recommend she sit in chair, walk as able, but can do chair exercises to keep legs moving as tolerated. She also has compression stockings that she will use until her heel improves. She will call Monday with an update.

## 2019-01-24 ENCOUNTER — Encounter (HOSPITAL_BASED_OUTPATIENT_CLINIC_OR_DEPARTMENT_OTHER): Payer: Self-pay | Admitting: Family Nurse Practitioner

## 2019-01-24 ENCOUNTER — Telehealth (INDEPENDENT_AMBULATORY_CARE_PROVIDER_SITE_OTHER): Payer: Medicare Other | Admitting: Family Nurse Practitioner

## 2019-01-24 VITALS — Ht 64.0 in | Wt 147.0 lb

## 2019-01-24 DIAGNOSIS — D0512 Intraductal carcinoma in situ of left breast: Secondary | ICD-10-CM

## 2019-01-24 NOTE — Progress Notes (Signed)
Treatment Team  Ref Prov:  Primary Care Physician:  Raj Janus, MD Radiology facility:[facility])--[phone] Breast Surgeon :Henderson Baltimore MD  [phone]   Plastic Surgeonnone Radiation Onc:  Miguel Dibble MD: [phone] [facility] [place]); Medical Onc:  Laney Pastor, MD [phone] [facility] [place])      Breast Cancer Comprehensive Care Plan Summary  Date of diagnosis: 11/03/2018 Event : DCIS ECOG Performance:Grade 0 (Fully active) Genetic Testing:not indicated   Presentation :Abnormal mammogram with microcalcifications Side: left Focality: unifocal Location:radian 12:00   Biologic Tumor characteristics  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 - not indicated Oncotype Dx:  DCIS , not indicated   Estrogen Receptor: positive 19 % Progesterone Receptor: negative Her-2-neu Receptor: DCIS, not indicated Androgen Receptor;  not done   Staging  Tumor Size:   DCIS with <14mm microinvasion Nodes: clinically/US negative Systemic Metastases: clinically negative Stage: Stage0 , Tmic N0 M0   Treatment  Modality Date    Surgery   12/25/18 left, partial mastectomy with needle localization    Radiation     Endocrine     Chemotherapy     Biologic     Clinical trial         [facility]  TELEMEDICINE POST OPERATIVE VISIT NOTE    Telemedicine Documentation Requirements    Originating site (Patient location): Patient Home  Distant site (Provider location): [facility]  Provider and Title: Marigene Ehlers, NP  Language, if applicable and if translator was required: English    This visit is being conducted via video and telephone.    Verbal consent has been obtained from the patient to conduct a telephone visit encounter to minimize exposure to COVID-19: yes    Subjective:  [name] had re-excision left lumpectomy on 01/15/2019 . She reports mild pain and improving skin rash. She also reports right heel  pain which she believes is tendonitis. Denies severe pain, breast swelling and purulent discharge from the incision.   She is otherwise active . She is tolerating her diet well. Pathology results as below.    Physical Exam:  Clinically, she is doing well   Incision with steri-strips in place. No signs of infection.     Assessment and Plan:  Clinically, she is doing well.  We have reviewed the results of the pathology with the patient.  I have given the patient a copy of the pathology report via MyChart.  At this point, I would recommend that we set her up for a follow up left mammogram in 6 months.  She is also encouraged to follow up with  radiation oncology. She previously met with medical oncology and is not recommended to take endocrine therapy.   She ought to follow up in the office in 6 months.  Patient agrees with plan.     Pathology  SURGICAL PATHOLOGY REPORT     DIAGNOSIS:     A) LEFT BREAST, SUPERIOR ANTERIOR MARGIN,     RE-EXCISION WITH ASSESSMENT OF MARGIN:     1. Benign fatty breast tissue with prior surgical site changes     2. Negative for residual DCIS and invasive carcinoma     3. Final superior/anterior margin is negative for DCIS and       invasive carcinoma by >1 cm     B) LEFT BREAST, NEW LATERAL MARGIN,     RE-EXCISION WITH ASSESSMENT OF MARGIN:     1. Benign fatty breast tissue with prior  surgical site changes     2. Negative for residual DCIS and invasive carcinoma     3. Final lateral margin is negative for DCIS and       invasive carcinoma by >1 cm     C) LEFT BREAST, NEW INFERIOR MARGIN,     RE-EXCISION WITH ASSESSMENT OF MARGIN:     1. Benign fatty breast tissue with prior surgical site changes     2. Negative for residual DCIS and invasive carcinoma     3. Final inferior margin is negative for DCIS and       invasive carcinoma by >1 cm           <Electronically Signed>   SAEID MOVAHEDI-LANKARANI, MD        Time spent in discussion: 11 to 20 minutes      The recent COVID-19 pandemic has caused disruptions in normal operations spanning access to care, radiographic imaging, medical treatment, and access to medications, among others. Some deviation from normal standards of care may be experienced during this time. Once operations return to normal working order, any procedures, tests, treatments, or studies delayed by the pandemic will be scheduled as soon as possible.

## 2019-01-24 NOTE — Patient Instructions (Signed)
Katelyn Caldwell,    I released your pathology results to your MyChart and will send via e-mail as well.     As discussed, Windell Moulding will contact medical oncology and radiation oncology for appointments. A scheduler from their teams will reach out to you to get those appointments set up.     Resume exercise as tolerated, while using caution when exerting pressure on chest wall (No push-ups, bench presses, etc. Until 4 weeks post-op).     Call me anytime with questions or concerns. Call right away if you experience increased pain, swelling, redness, discharge from the incision, or if you have fever and chills.     Stay safe!    Take care,    [facility]

## 2019-01-25 ENCOUNTER — Telehealth (HOSPITAL_BASED_OUTPATIENT_CLINIC_OR_DEPARTMENT_OTHER): Payer: Self-pay

## 2019-01-28 ENCOUNTER — Telehealth (HOSPITAL_BASED_OUTPATIENT_CLINIC_OR_DEPARTMENT_OTHER): Payer: Self-pay

## 2019-02-01 ENCOUNTER — Ambulatory Visit
Admission: RE | Admit: 2019-02-01 | Discharge: 2019-02-01 | Disposition: A | Discharge status: Home / Self Care | Payer: Medicare Other | Source: Ambulatory Visit | Attending: Surgery | Admitting: Surgery

## 2019-02-01 DIAGNOSIS — Z170 Estrogen receptor positive status [ER+]: Secondary | ICD-10-CM | POA: Insufficient documentation

## 2019-02-01 DIAGNOSIS — D0512 Intraductal carcinoma in situ of left breast: Secondary | ICD-10-CM | POA: Insufficient documentation

## 2019-02-08 ENCOUNTER — Encounter (INDEPENDENT_AMBULATORY_CARE_PROVIDER_SITE_OTHER): Payer: Self-pay

## 2019-02-11 ENCOUNTER — Ambulatory Visit: Payer: Self-pay | Admitting: Radiation Oncology

## 2019-02-11 DIAGNOSIS — D0512 Intraductal carcinoma in situ of left breast: Secondary | ICD-10-CM

## 2019-02-11 NOTE — Progress Notes (Signed)
02/11/19 1300   Comfort Alteration   Karnofsky Performance Score 90%-can perform normal activity, minor signs of disease   Fatigue 1   Pain Score 4   Pain Loc BREAST   Pain Intervention 0-none   Nutrition Alteration   Weight 66.7 kg (147 lb)   Vital signs   Temp 98 F (36.7 C)   Temp Source Temporal   Heart Rate 78   Resp Rate 16   BP 137/68

## 2019-02-11 NOTE — Progress Notes (Signed)
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.  Final margins are all widely clear at this juncture.  As her disease is weakly ER PR positive she does not have plans for adjuvant endocrine therapy as per patient.  Of note, her lymph nodes were not sampled in the context of microinvasion only.    She has having mild to moderate persistent intermittent discomfort of the left breast in the region of her incision as well as the lateral breast.  She notes stable range of motion of the left upper extremity, chronically limited due to previous injury.  She denies any other new complaints.  She denies fevers chills or shortness of breath.    Of note her postoperative mammogram prior to reexcision did reveal residual microcalcifications, and her specimen mammogram from reexcision does appear to contain these calcifications centrally.          Current Medications:    Current Outpatient Medications:     atorvastatin (LIPITOR) 40 MG tablet, Take 1 tablet (40 mg total) by mouth daily. (Patient taking differently: Take 40 mg by mouth every evening  ), Disp: 90 tablet, Rfl: 3    cyanocobalamin ([employer] VITAMIN B12) 1000 MCG tablet, Take 1 tablet (1,000 mcg total) by mouth daily, Disp: 90 tablet, Rfl: 2    fluticasone (FLONASE) 50 MCG/ACT nasal spray, 2 sprays by Nasal route every evening  , Disp: , Rfl:     glucose blood (FREESTYLE LITE) test strip, Pt uses freestyle freedom lyte test strips testing BID prn, Disp: 300 each, Rfl: 3    Lancets (FREESTYLE) lancets, Use twice daily as needed, Disp: 300 each, Rfl: 3    lidocaine (LIDODERM) 5 %, Place 1 patch onto affected skin every twelve hours as needed for discomfort.  Remove  each patch after 12 hours of use, Disp: 30 patch, Rfl: 2    losartan (COZAAR) 25 MG tablet, Take 25 mg by mouth daily, Disp: , Rfl:     MAGNESIUM PO, Take by mouth, Disp: , Rfl:     metFORMIN (GLUCOPHAGE XR) 500 MG 24 hr tablet, Take 1 tablet (500 mg total) by mouth nightly, Disp: 90 tablet, Rfl: 3    metoprolol succinate XL (TOPROL-XL) 50 MG 24 hr tablet, Take 25 mg by mouth every evening  , Disp: , Rfl:     pantoprazole (PROTONIX) 40 MG tablet, Take 40 mg by mouth every evening, Disp: , Rfl:     Probiotic Product (PROBIOTIC-10 PO), Take by mouth, Disp: , Rfl:     SITagliptin-MetFORMIN HCl (JANUMET XR) 50-500 MG Tablet SR 24 hr, Take 1 tablet by mouth 2 (two) times daily with meals., Disp: 180 tablet, Rfl: 3    VITAMIN D PO, Take 500 Unit by mouth, Disp: , Rfl:           Allergies:  Allergies   Allergen Reactions    Ciprofloxacin Other (See Comments)     Severe Connective Tissue (joint) pain and stiffness    Nsaids Other (See Comments)     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    Breast Exam:     Focused exam of the left breast reveals good healing of her central incision without suspicious masses nodules or skin changes identified.  There is mild firmness in this area.  This is consistent with postoperative changes.  No signs or symptoms of infection.  No axillary or supraclavicular adenopathy noted.  Good range of motion noted of upper extremities without edema.  Neurologically nonfocal        Assessment/Plan:    Ready to initiate planning of whole breast radiotherapy for microinvasive breast carcinoma, ER weakly positive, PR neg. She has undergone successful reexcision to negative margins.  Calcifications are well encompassed within the specimen mammogram, and further mammography prior to radiotherapy would not appear to be beneficial.    As previously  discussed I would recommend accelerated fractionation whole breast radiotherapy in this context, to maximize local control.  Given her tumor biology I would recommend boost to the tumor bed.  As her lymph nodes were not sampled, I would include these in the initial "high tangent" fields to encompass the low axilla.    Total dose would be 42.4 Gy in 16 fractions to the whole breast and low axilla, followed by 4 fraction boost to the tumor bed.      Risks, benefits, and rationale for this treatment were previously discussed at consultation and were again reviewed today.  Simulation will be performed today including assessment for DIBH technology.  To allow for enough time for healing and recovery we will initiate treatments in early June.    All questions and concerns were answered in full detail.  She will continue plans for ongoing postoperative follow-up with [name] as indicated.    Thanks again for allowing me to participate in the care of this very pleasant woman.

## 2019-02-11 NOTE — Patient Instructions (Signed)
Department of Radiation Therapy  Skin Care for Patients Receiving Radiation Therapy to the Breast and Chest Wall    Radiation therapy can cause temporary skin changes in the treated area including:  . dry itchy skin  . pink, red or dark skin color  . tenderness, swelling and warmth.    Most patients start to experience these side effects around week 2-3, and gradually they become more noticeable.  The degree of skin change does not correlate with how a person reacts to the sun.  Each person may react a little differently, with some developing almost no changes, and others developing what looks like a bad sunburn.  Areas may actually peel during or after the treatment.  Larger-breasted women tend to have more skin changes compared to small-breasted patients. Patients being treated after mastectomy typically have more skin changes.  After treatment, the vast majority of patients experience resolution of these changes within a few weeks to a few months.    You may hear or read about different patients' experiences with radiation therapy.  It's important to realize that each patient is different.  There is no evidence that one moisturizer is better than another, and what works well for one person may not work as well for another.  Most patients will have had sunburns worse than the reaction they get to radiation!    There are several things you can do to minimize discomfort during treatment:  1.  Come in for treatment with clean, dry skin.  It is fine to have powder on your skin, but do not use lotions or creams prior to treatment.    2. You may wash the treated area normally using gentle soap (like Dove) or liquid body wash with moisturizers. Do not use harsh deodorant soaps, or very hot water which may irritate skin. Do not rub hard or use a loofah.    3. It is fine to use deodorant during your treatment as long as the area under your arm is not being treated. Your doctor can review this with you.   If it is being  treated, you may apply powder like Zeasorb or Gold Bond instead.    4. During the day, apply Zeasorb or Gold Bond powder, both available over-the-counter at pharmacies.  This helps to decrease friction or rubbing. If your breast is on the larger size, powder helps keep the areas underneath the breast and the armpit dry.  Reapply powder prior to exercising and as desired throughout the day.  It is fine to have powder on your skin when you come in for your treatment.    5. At bedtime, apply a light layer of a moisturizer of your choice. This is enough moisturizer for most people, but if your skin feels dry, you may use moisturizer during the day, AFTER your treatment.   It is important that there is no moisturizer on your skin when you are treated.     6. The upper inner part of the treated breast where there is usually less breast tissue sometimes develops folliculitis--a temporary inflammation of the hair follicles that causes a bumpy appearance and itching.  It usually responds well to over-the-counter hydrocortisone cream.  If that doesn't help, your doctor may prescribe a stronger steroid cream.  It will go away after the treatment is finished.    7. If the breast or nipple feels tender, it is fine to use Tylenol (acetaminophen), Motrin or Advil (ibuprofen).  Cool compresses help as well.    Never apply extreme hot or extreme cold to the treatment area.  Stronger medications may be recommended by your physician depending on the severity of your reaction.    8. You should be able to wear a regular bra during the weeks of your treatment.  If that becomes uncomfortable, you can use an extender (available at sewing shops like [employer]) in back to make it a little looser, or wear a camisole.  Sports bras tend to compress too much, and while they are fine during exercise, may not be too comfortable if worn all day. However, feel free to wear whatever is comfortable.     9. Depending on the level of reaction in your  armpit (axilla), your doctor or nurse may recommend you cut down or eliminate shaving in this area, especially towards the end of treatment, to reduce friction and the risk of skin breakdown.        Your doctor and nurse will see you each week and check your skin.  However, if you have a question on a "non-doctor" day, please let us know. There is always someone around to help, and our goal is to make your treatment course as smooth and easy as possible. (And if Friday comes along and you have not seen a doctor that week, please let us know!)    After your treatment course has been completed, you can continue to use the powder and moisturizer for several weeks as your skin improves.  Avoid prolonged exposure of the treated skin to direct sunlight, and use a sunscreen with SPF 30 or higher if you are in the sun.    MORNING - Powder  BEDTIME - Moisturizer--see below    Some recommended moisturizers:    CeraVe       Aloe       Cetaphil      Aquaphor      Cocoa Butter  Aveeno      Shea Butter  Calendula cream--available at [employer], [employer] and [employer]

## 2019-02-12 NOTE — Progress Notes (Signed)
PGSGA nutrition screen = 0   Consult RD as needed.     Liddie Chichester, MS RD CSO  [phone]

## 2019-03-04 ENCOUNTER — Ambulatory Visit
Admission: RE | Admit: 2019-03-04 | Discharge: 2019-03-04 | Disposition: A | Discharge status: Home / Self Care | Payer: Medicare Other | Source: Ambulatory Visit | Attending: Surgery | Admitting: Surgery

## 2019-03-04 DIAGNOSIS — D0512 Intraductal carcinoma in situ of left breast: Secondary | ICD-10-CM | POA: Insufficient documentation

## 2019-03-04 DIAGNOSIS — Z170 Estrogen receptor positive status [ER+]: Secondary | ICD-10-CM | POA: Insufficient documentation

## 2019-03-05 ENCOUNTER — Encounter (HOSPITAL_BASED_OUTPATIENT_CLINIC_OR_DEPARTMENT_OTHER): Payer: Self-pay

## 2019-03-05 NOTE — Progress Notes (Signed)
Encounter created for distress screening documentation on 03/05/19. See flow sheet for details.  CIDS Score:2  Follow Up Needed:not at this time  Referrals: [facility] exercise programs and support group

## 2019-03-07 ENCOUNTER — Ambulatory Visit: Payer: Self-pay | Admitting: Radiation Oncology

## 2019-03-07 DIAGNOSIS — D0512 Intraductal carcinoma in situ of left breast: Secondary | ICD-10-CM

## 2019-03-07 NOTE — Progress Notes (Signed)
This visit was a synchronous telemedicine service rendered via a real time interactive audio and video to reduce the risk of coronavirus exposure to on-treatment patients and staff during the coronavirus pandemic. The patient had previously consented to OTC via telemedicine. An in-person physical examination was not performed by the MD, but the MD was available for an in-person examination if necessary.     03/07/19 1500   Radiation Therapy Patient Care - Breast    Fraction  3   Daily Dose 265   Total Dose 795   Prescribed Dose 4240   Site left breast/low axilla   Comfort Alteration   Karnofsky Performance Score 90%-can perform normal activity, minor signs of disease   Fatigue 1   Pain Score 2   Pain Loc BREAST   Pain Intervention 0-none   Skin Alteration   Radiation Dermatitis 0-none   Folliculitis No   Skin Care Gold Bond Powder;Other  (cerave)     RADIATION ONCOLOGY ON-TREATMENT NOTE    Current Dose:  795 cGy in 3 fractions    Subjective:  Pt denies any new complaints or concerns this week. Reports intermittent shooting pains in the breast but rates it as mild and does not require meds. Has started skin care with powders and cerave.     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    Plan:  Continue radiation therapy. Skin care and expectations reviewed.

## 2019-03-07 NOTE — Progress Notes (Signed)
03/07/19 1500   Radiation Therapy Patient Care - Breast    Fraction  3   Daily Dose 265   Total Dose 795   Prescribed Dose 4240   Site left breast/low axilla   Comfort Alteration   Karnofsky Performance Score 90%-can perform normal activity, minor signs of disease   Fatigue 1   Pain Score 2   Pain Loc BREAST   Pain Intervention 0-none   Skin Alteration   Radiation Dermatitis 0-none   Folliculitis No   Skin Care Gold Bond Powder;Other  (cerave)     RADIATION ONCOLOGY ON-TREATMENT NOTE    Current Dose:  795 cGy in 3 fractions    Subjective:  Pt denies any new complaints or concerns this week. Reports intermittent shooting pains in the breast but rates it as mild and does not require meds. Has started skin care with powders and cerave.     Objective:  No skin changes.     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    Plan:  Continue radiation therapy.

## 2019-03-09 ENCOUNTER — Encounter (INDEPENDENT_AMBULATORY_CARE_PROVIDER_SITE_OTHER): Payer: Self-pay | Admitting: Family Medicine

## 2019-03-11 ENCOUNTER — Encounter (INDEPENDENT_AMBULATORY_CARE_PROVIDER_SITE_OTHER): Payer: Self-pay

## 2019-03-11 MED ORDER — LOSARTAN POTASSIUM 25 MG PO TABS
25.0000 mg | ORAL_TABLET | Freq: Every day | ORAL | 1 refills | Status: DC
Start: 2019-03-11 — End: 2019-06-03

## 2019-03-14 ENCOUNTER — Ambulatory Visit: Payer: Self-pay | Admitting: Radiation Oncology

## 2019-03-14 DIAGNOSIS — D0512 Intraductal carcinoma in situ of left breast: Secondary | ICD-10-CM

## 2019-03-14 NOTE — Progress Notes (Signed)
03/14/19 1400   Radiation Therapy Patient Care - Breast    Fraction  8   Daily Dose 265   Total Dose 2120   Prescribed Dose 4240   Site left breast/low axilla   Comfort Alteration   Karnofsky Performance Score 100%-normal, no complaints   Fatigue 1   Pain Score 0   Skin Alteration   Radiation Dermatitis 1-faint erythema or dry desquamation   Skin Care Gold Bond Powder;Other  (cerave)     RADIATION ONCOLOGY ON-TREATMENT NOTE    Current Dose:  2120 cGy in 8 fractions    Subjective:  Pt reports "feeling good" this week. Noticed small red bumps on her skin but denies itching or sensitivity. Denies pain. Using powders and cerave moisturizer.     Objective:  Mild erythema with mild folliculitis noted.     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    Plan:  Continue radiation therapy.

## 2019-03-15 NOTE — Progress Notes (Signed)
03/14/19 1400   Radiation Therapy Patient Care - Breast    Fraction  8   Daily Dose 265   Total Dose 2120   Prescribed Dose 4240   Site left breast/low axilla   Comfort Alteration   Karnofsky Performance Score 100%-normal, no complaints   Fatigue 1   Pain Score 0   Skin Alteration   Radiation Dermatitis 1-faint erythema or dry desquamation   Skin Care Gold Bond Powder;Other  (cerave)     RADIATION ONCOLOGY ON-TREATMENT NOTE    Current Dose:  2120 cGy in 8 fractions    Subjective:  Pt reports "feeling good" this week. Noticed small red bumps on her skin but denies itching or sensitivity. Denies pain. Using powders and cerave moisturizer.     Objective:  Mild erythema with mild folliculitis noted.     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    Plan:  Continue radiation therapy. Skin care reviewed---1% cortisone if needed for itching.

## 2019-03-21 ENCOUNTER — Ambulatory Visit: Payer: Self-pay | Admitting: Radiation Oncology

## 2019-03-21 DIAGNOSIS — D0512 Intraductal carcinoma in situ of left breast: Secondary | ICD-10-CM

## 2019-03-21 NOTE — Progress Notes (Signed)
03/21/19 1400   Radiation Therapy Patient Care - Breast    Fraction  13   Daily Dose 265   Total Dose 3445   Prescribed Dose 4240   Site left breast/low axilla   Comfort Alteration   Karnofsky Performance Score 90%-can perform normal activity, minor signs of disease   Fatigue 3   Pain Score 0   Skin Alteration   Radiation Dermatitis 1-faint erythema or dry desquamation   Folliculitis Yes   Skin Care Gold Bond Powder;Other  (cerave)     RADIATION ONCOLOGY ON-TREATMENT NOTE    Current Dose:  3445 cGy in 13 fractions    Subjective:  Pt reports increased fatigue this week. Denies significant pain (occasional shooting pains). Denies itchiness.     Objective:  Mild erythema to breast.     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    Plan:  Continue radiation therapy.

## 2019-03-22 NOTE — Progress Notes (Signed)
03/21/19 1400   Radiation Therapy Patient Care - Breast    Fraction  13   Daily Dose 265   Total Dose 3445   Prescribed Dose 4240   Site left breast/low axilla   Comfort Alteration   Karnofsky Performance Score 90%-can perform normal activity, minor signs of disease   Fatigue 3   Pain Score 0   Skin Alteration   Radiation Dermatitis 1-faint erythema or dry desquamation   Folliculitis Yes   Skin Care Gold Bond Powder;Other  (cerave)     RADIATION ONCOLOGY ON-TREATMENT NOTE    Current Dose:  3445 cGy in 13 fractions    Subjective:  Pt reports increased fatigue this week. Denies significant pain (occasional shooting pains). Denies itchiness.     Objective:  Mild erythema to breast. No breakdown. Mild     Assessment:  1. Ductal carcinoma in situ (DCIS) of left breast     with microinvasion.  Tolerating treatment, appropriate reactions at this dose level.    Plan:  Continue radiation therapy.  Skin care reviewed.

## 2019-03-26 ENCOUNTER — Ambulatory Visit: Payer: Self-pay | Admitting: Radiation Oncology

## 2019-03-26 ENCOUNTER — Encounter: Payer: Self-pay | Admitting: Radiation Oncology

## 2019-03-26 ENCOUNTER — Emergency Department
Admission: EM | Admit: 2019-03-26 | Discharge: 2019-03-26 | Disposition: A | Discharge status: Home / Self Care | Payer: Medicare Other | Attending: Emergency Medicine | Admitting: Emergency Medicine

## 2019-03-26 ENCOUNTER — Emergency Department: Payer: Medicare Other

## 2019-03-26 DIAGNOSIS — R0789 Other chest pain: Secondary | ICD-10-CM | POA: Insufficient documentation

## 2019-03-26 DIAGNOSIS — E785 Hyperlipidemia, unspecified: Secondary | ICD-10-CM | POA: Insufficient documentation

## 2019-03-26 DIAGNOSIS — D0512 Intraductal carcinoma in situ of left breast: Secondary | ICD-10-CM

## 2019-03-26 DIAGNOSIS — Z7984 Long term (current) use of oral hypoglycemic drugs: Secondary | ICD-10-CM | POA: Insufficient documentation

## 2019-03-26 DIAGNOSIS — K219 Gastro-esophageal reflux disease without esophagitis: Secondary | ICD-10-CM | POA: Insufficient documentation

## 2019-03-26 DIAGNOSIS — R0602 Shortness of breath: Secondary | ICD-10-CM | POA: Insufficient documentation

## 2019-03-26 DIAGNOSIS — Z853 Personal history of malignant neoplasm of breast: Secondary | ICD-10-CM | POA: Insufficient documentation

## 2019-03-26 DIAGNOSIS — E1122 Type 2 diabetes mellitus with diabetic chronic kidney disease: Secondary | ICD-10-CM | POA: Insufficient documentation

## 2019-03-26 DIAGNOSIS — G4730 Sleep apnea, unspecified: Secondary | ICD-10-CM | POA: Insufficient documentation

## 2019-03-26 DIAGNOSIS — N183 Chronic kidney disease, stage 3 (moderate): Secondary | ICD-10-CM | POA: Insufficient documentation

## 2019-03-26 DIAGNOSIS — R079 Chest pain, unspecified: Secondary | ICD-10-CM

## 2019-03-26 DIAGNOSIS — I129 Hypertensive chronic kidney disease with stage 1 through stage 4 chronic kidney disease, or unspecified chronic kidney disease: Secondary | ICD-10-CM | POA: Insufficient documentation

## 2019-03-26 LAB — COMPREHENSIVE METABOLIC PANEL
ALT: 9 U/L (ref 0–55)
AST (SGOT): 18 U/L (ref 5–34)
Albumin/Globulin Ratio: 1.5 (ref 0.9–2.2)
Albumin: 3.8 g/dL (ref 3.5–5.0)
Alkaline Phosphatase: 80 U/L (ref 37–106)
Anion Gap: 9 (ref 5.0–15.0)
BUN: 24 mg/dL — ABNORMAL HIGH (ref 7–19)
Bilirubin, Total: 0.3 mg/dL (ref 0.2–1.2)
CO2: 24 mEq/L (ref 22–29)
Calcium: 9.2 mg/dL (ref 8.5–10.5)
Chloride: 105 mEq/L (ref 100–111)
Creatinine: 1.1 mg/dL — ABNORMAL HIGH (ref 0.6–1.0)
Globulin: 2.6 g/dL (ref 2.0–3.6)
Glucose: 173 mg/dL — ABNORMAL HIGH (ref 70–100)
Potassium: 5.2 mEq/L — ABNORMAL HIGH (ref 3.5–5.1)
Protein, Total: 6.4 g/dL (ref 6.0–8.3)
Sodium: 138 mEq/L (ref 136–145)

## 2019-03-26 LAB — CBC AND DIFFERENTIAL
Absolute NRBC: 0 10*3/uL (ref 0.00–0.00)
Basophils Absolute Automated: 0.02 10*3/uL (ref 0.00–0.08)
Basophils Automated: 0.5 %
Eosinophils Absolute Automated: 0.04 10*3/uL (ref 0.00–0.44)
Eosinophils Automated: 0.9 %
Hematocrit: 38.2 % (ref 34.7–43.7)
Hgb: 12.3 g/dL (ref 11.4–14.8)
Immature Granulocytes Absolute: 0.01 10*3/uL (ref 0.00–0.07)
Immature Granulocytes: 0.2 %
Lymphocytes Absolute Automated: 0.8 10*3/uL (ref 0.42–3.22)
Lymphocytes Automated: 18.9 %
MCH: 28.1 pg (ref 25.1–33.5)
MCHC: 32.2 g/dL (ref 31.5–35.8)
MCV: 87.2 fL (ref 78.0–96.0)
MPV: 11.7 fL (ref 8.9–12.5)
Monocytes Absolute Automated: 0.32 10*3/uL (ref 0.21–0.85)
Monocytes: 7.6 %
Neutrophils Absolute: 3.04 10*3/uL (ref 1.10–6.33)
Neutrophils: 71.9 %
Nucleated RBC: 0 /100 WBC (ref 0.0–0.0)
Platelets: 141 10*3/uL — ABNORMAL LOW (ref 142–346)
RBC: 4.38 10*6/uL (ref 3.90–5.10)
RDW: 13 % (ref 11–15)
WBC: 4.23 10*3/uL (ref 3.10–9.50)

## 2019-03-26 LAB — PT AND APTT
PT INR: 1 (ref 0.9–1.1)
PT: 12.8 s (ref 12.6–15.0)
PTT: 26 s (ref 23–37)

## 2019-03-26 LAB — URINALYSIS REFLEX TO MICROSCOPIC EXAM - REFLEX TO CULTURE
Bilirubin, UA: NEGATIVE
Blood, UA: NEGATIVE
Glucose, UA: NEGATIVE
Ketones UA: NEGATIVE
Leukocyte Esterase, UA: NEGATIVE
Nitrite, UA: NEGATIVE
Protein, UR: 100 — AB
Specific Gravity UA: 1.015 (ref 1.001–1.035)
Urine pH: 6 (ref 5.0–8.0)
Urobilinogen, UA: NORMAL mg/dL (ref 0.2–2.0)

## 2019-03-26 LAB — GFR: EGFR: 49.3

## 2019-03-26 LAB — B-TYPE NATRIURETIC PEPTIDE: B-Natriuretic Peptide: 53 pg/mL (ref 0–100)

## 2019-03-26 LAB — IHS D-DIMER: D-Dimer: 0.37 ug/mL FEU (ref 0.00–0.70)

## 2019-03-26 LAB — TROPONIN I: Troponin I: 0.01 ng/mL (ref 0.00–0.05)

## 2019-03-26 NOTE — ED Triage Notes (Signed)
Pt to ED with c/o's of SOB and midsternal chest pain noted yesterday. Pt reports "yawning more frequently". DOE. No cough. Afebrile upon arrival.

## 2019-03-26 NOTE — ED Notes (Signed)
Pt verbalized understanding of dci, 0 rx, and f/u. Denies any further questions. No IV access. Ambulatory on own with steady gait noted. Family to accompany home.

## 2019-03-26 NOTE — Progress Notes (Signed)
03/26/19 1506   Radiation Therapy Patient Care - Breast    Fraction  16   Daily Dose 265   Total Dose 4240   Prescribed Dose 4240   Comfort Alteration   Karnofsky Performance Score 100%-normal, no complaints   Fatigue 2   Pain Score 3   Pain Loc CHEST   Pain Intervention 0-none   Skin Alteration   Radiation Dermatitis 1-faint erythema or dry desquamation   Folliculitis No   Skin Care Gold Bond Powder;Other   Emotional Alteration   Coping 0-effective   Vital signs   Temp 97.7 F (36.5 C)   Temp Source Temporal   Heart Rate 75   Resp Rate 18   BP 137/72     RADIATION ONCOLOGY ON-TREATMENT NOTE    Current Dose: 3975/4240cGy plus boost to left breast    Subjective: Patient is c/o pain in the middle of her chest s/p   RT yesterday.  . VSS, O2Sat 97%. No SOB, no diaphoresis, no fever or cough. Patient requested to speak to MD re: symptoms.   No breast pain, mild fatigue. Patient using cream on skin.     Objective: Very mild erythema of the left breast.    Lungs CTA.  CV RRR  Neg Homan's  No LE edema    Assessment:  1. Ductal carcinoma in situ (DCIS) of left breast     Substernal pain since yesterday evening.      Plan:  Discussed with pt that while chest pain may be secondary to mild breast swelling, or GI in nature, we would not want to miss some type of cardiac event.  Will take her to ED for evaluation, rule out MI. She will miss Rt to left breast today.    Miguel Dibble, MD  Radiation Oncology

## 2019-03-26 NOTE — Discharge Instructions (Addendum)
#######################################      Return immediately to the emergency room if you have any worsening symptoms!!! Otherwise, see cardiologist , Dr. Ginger Organ, the next 2 days, or return to the emergency department in 2 days if you have any trouble getting an appointment.       #######################################          Thank you for choosing [facility] for your emergency care needs.  We strive to provide EXCELLENT care to you and your family.      IF YOU DO NOT CONTINUE TO IMPROVE OR YOUR CONDITION WORSENS, PLEASE CONTACT YOUR DOCTOR OR RETURN IMMEDIATELY TO THE EMERGENCY DEPARTMENT.    The examination and treatment you have received in our Emergency Department is provided on an emergency basis, and is not intended to be a substitute for your primary care physician.  It is important that your doctor checks you again and that you report any new or remaining problems at that time.      DOCTOR REFERRALS  Call [phone] (available 24 hours a day, 7 days a week) if you need any further referrals and we can help you find a primary care doctor or specialist.  Also, available online at:  [email or web address]    YOUR CONTACT INFORMATION  Before leaving please check with registration to make sure we have an up-to-date contact number.  You can call registration at [phone] to update your information.  For questions about your hospital bill, please call [phone].  For questions about your Emergency Dept Physician bill please call [phone].      FREE HEALTH SERVICES  If you need help with health or social services, please call 2-1-1 for a free referral to resources in your area.  2-1-1 is a free service connecting people with information on health insurance, free clinics, pregnancy, mental health, dental care, food assistance, housing, and substance abuse counseling.  Also, available online at:  http://[email or web address].org    MEDICAL RECORDS AND TESTS  Certain  laboratory test results do not come back the same day, for example urine cultures.   We will contact you if other important findings are noted.  Radiology films are often reviewed again to ensure accuracy.  If there is any discrepancy, we will notify you.      Please call [phone] to pick up a complimentary CD of any radiology studies performed.  If you or your doctor would like to request a copy of your medical records, please call [phone].      ORTHOPEDIC INJURY   Please know that significant injuries can exist even when an initial x-ray is read as normal or negative.  This can occur because some fractures (broken bones) are not initially visible on x-rays.  For this reason, close outpatient follow-up with your primary care doctor or bone specialist (orthopedist) is required.    MEDICATIONS AND FOLLOWUP  Please be aware that some prescription medications can cause drowsiness.  Use caution when driving or operating machinery.    24 HOUR PHARMACIES  [facility] - [address] [place], [place] [address] (1.4 miles, 7 minutes)  [facility] - [address] [place], [place] [address] (6.5 miles, 13 minutes)  A handout with directions is available on request.    PATIENT RELATIONS  If you have any concerns, issues or feedback with your care, positive or negative, please do not hesitate to contact Patient Relations at [phone]. They are open from 8:30AM-5:00PM Monday through Friday.
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
## 2019-03-27 ENCOUNTER — Telehealth (INDEPENDENT_AMBULATORY_CARE_PROVIDER_SITE_OTHER): Payer: Self-pay | Admitting: Family Medicine

## 2019-03-27 NOTE — Telephone Encounter (Signed)
Attempted to call patient for ED follow up visit. LMTCB.

## 2019-03-27 NOTE — Telephone Encounter (Signed)
Pt called and stated that she does not want to make a ED follow up visit at the moment.

## 2019-03-28 ENCOUNTER — Ambulatory Visit: Payer: Self-pay | Admitting: Radiation Oncology

## 2019-03-28 DIAGNOSIS — D0512 Intraductal carcinoma in situ of left breast: Secondary | ICD-10-CM

## 2019-03-28 LAB — ECG 12-LEAD
Atrial Rate: 71 {beats}/min
P Axis: 39 degrees
P-R Interval: 150 ms
Q-T Interval: 414 ms
QRS Duration: 92 ms
QTC Calculation (Bezet): 449 ms
R Axis: 54 degrees
T Axis: 75 degrees
Ventricular Rate: 71 {beats}/min

## 2019-03-28 NOTE — Progress Notes (Signed)
03/28/19 1400   Radiation Therapy Patient Care - Breast    Fraction  17   Daily Dose 265   Total Dose 4490   Prescribed Dose 5240   Site left breast   Comfort Alteration   Karnofsky Performance Score 90%-can perform normal activity, minor signs of disease   Fatigue 3   Pain Score 2   Pain Loc BREAST   Skin Alteration   Radiation Dermatitis 2-moderate to brisk erythemia or patchy moist desquamation, mostly confined to skin folds and creases, or moderate edema   Skin Care Gold Bond Powder  (cerave)     RADIATION ONCOLOGY ON-TREATMENT NOTE    Current Dose:  4490 cGy in 17 fractions    Subjective:  Pt reports some tenderness to nipple and in axilla. Denies itching or skin sensitivity. Using powders and lotion. Had an ER visit on 6/23 for chest pain, testing returned negative for cardiac event.  Mild substernal tightness persists but not bothersome to patient.  No SOB, no cough.   On PPI, avoids late PO intake.    Objective:  Moderate erythema of the left breast. Moderate hyperpigmentation in axilla.  + folliculitis at nipple and superior breast.  No breakdown.  Incisions intact.    Assessment:  1. Ductal carcinoma in situ (DCIS) of left breast       Tolerating treatment, brisk but appropriate reaction.    Plan:  Continue radiation therapy.  OTC HC 1% in nipple region for sensitivity/tenderness.  Post-RT skin care instructions reviewed in detail.  Continue as prescribed. Monitor chest symptoms, cardiac causes ruled out.

## 2019-03-28 NOTE — Progress Notes (Signed)
03/28/19 1400   Radiation Therapy Patient Care - Breast    Fraction  17   Daily Dose 265   Total Dose 4490   Prescribed Dose 5240   Site left breast   Comfort Alteration   Karnofsky Performance Score 90%-can perform normal activity, minor signs of disease   Fatigue 3   Pain Score 2   Pain Loc BREAST   Skin Alteration   Radiation Dermatitis 2-moderate to brisk erythemia or patchy moist desquamation, mostly confined to skin folds and creases, or moderate edema   Skin Care Gold Bond Powder  (cerave)     RADIATION ONCOLOGY ON-TREATMENT NOTE    Current Dose:  4490 cGy in 17 fractions    Subjective:  Pt reports some tenderness to nipple and in axilla. Denies itching or skin sensitivity. Using powders and lotion. Had an ER visit on 6/23 for chest pain, testing returned negative for cardiac event.     Objective:  Moderate erythema of the left breast. Moderate hyperpigmentation in axilla.     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    Plan:  Continue radiation therapy.

## 2019-04-01 ENCOUNTER — Inpatient Hospital Stay: Admission: RE | Admit: 2019-04-01 | Payer: Self-pay | Source: Ambulatory Visit

## 2019-04-01 NOTE — Patient Instructions (Signed)
Department of Radiation Oncology  End of Radiation Treatment Instructions    You have completed  radiation treatments to your breast  beginning on 03/05/2019 and ending on 04/02/2019.  You were treated by Dr. Dorene Grebe.  Your follow up appointment is 4-6 weeks.  Please call us at [phone] if you have any questions or concerns.    We will send a summary of your radiation treatment to all of the physicians you have told us are involved in your care.  Please make a follow-up appointment with your referring physician, [name], and any others as appropriate.      SKIN  Irritation or skin problems continue for several weeks.  Be GENTLE with the skin in the treatment area until problems are gone.  Dryness may continue and could be permanent.      1.   Continue any treatment we have prescribed until the skin is clearly improving  2.   You may moisturize the treatment area with your preferred lotion or cream.  3.   If skin develops wet desquamation (weeping skin) - call for instructions.  4.   Minimize sun exposure of the treated area.  Use a SUNSCREEN protector with a        SPF factor of 30 or higher.    5   Do not use heating pads or ice packs on treated area until redness had subsided.    ACTIVITY   Fatigue, feelings of weakness or weariness experienced during treatment usually resolve over 4-6 weeks or longer.  Resume or continue your usual daily routine activities as tolerated.  If you feel tired, limit activities and rest as needed.    DIET  Continue to eat a balanced, healthy diet.  Your body is recovering from treatment and needs good nutrition.  If you are losing weight without trying to do so, you need more nutrients.  Add two or three can of the nutritional supplement of your choice each day.    PAIN  If you have pain, continue to take your pain medication as needed. Some pain medications cannot be refilled without a new written prescription.  Pain can continue to diminish for several weeks after radiation  is complete.  If you are not sure about your pain medication, consult with Dr. Dorian Pod.    MEDICATION   Continue taking your current medications unless instructed otherwise.  For refills, it is generally best to call the office of the prescribing physician.      Kizzie Fantasia RN, BSN  [phone]

## 2019-04-02 ENCOUNTER — Encounter: Payer: Self-pay | Admitting: Radiation Oncology

## 2019-04-10 ENCOUNTER — Encounter (INDEPENDENT_AMBULATORY_CARE_PROVIDER_SITE_OTHER): Payer: Self-pay

## 2019-04-10 ENCOUNTER — Encounter (INDEPENDENT_AMBULATORY_CARE_PROVIDER_SITE_OTHER): Payer: Self-pay | Admitting: Family Medicine

## 2019-04-26 NOTE — Progress Notes (Signed)
Radiation Treatment Summary    Below you will find the radiation treatment summary for [name]. Thank you for allowing me to participate in the care of this pleasant woman.     STAGE:  [name] is a 68 year old woman diagnosed with left breast DCIS with microinvasive component ([id number], ER weak pos PR neg). She had a left breast lumpectomy performed by [name]. She required re-excision to achieve clear margins. [name] met with medical oncologist Dr. Dorian Pod and it was decided that endocrine therapy would not hold significant benefit for her.     TREATMENT SITE:  Left breast/ low axilla  TECHNIQUE:  High tangents with DIBH  BEAM ENERGY:  10MV/6MV photon  TOTAL DOSE:  Left breast: 265 cGy in 16 daily fractions for a total dose of 4240 cGy        Left low axilla: 250 cGy in 16 daily fractions for a total dose of 4000 cGy   TREATMENT DATES:  03/05/2019 - 03/27/2019  TOTAL ELAPSED DAYS:  22 days    TREATMENT SITE:  Left breast boost  TECHNIQUE:  En face electrons  BEAM ENERGY:   12 MeV  TOTAL DOSE:  250 cGy in 4 daily fractions for a total dose of 1000 cGy (cumulative dose of 5240 cGy to left breast)  TREATMENT DATES:  03/28/2019 - 04/02/2019  TOTAL ELAPSED DAYS:  5 days      CLINICAL COURSE AND PATIENT DISPOSITION:  [name] tolerated radiation therapy well. She experienced skin reactions with moderate erythema as well as mild folliculitis. She used Gold Bond powder and Cerave moisturizer throughout, and hydrocortisone 1% to areas of folliculitis. Her incisions remained intact without evidence of infection. She had mild to moderate breast pain and used OTC pain relievers with good relief. [name] did report some chest pain during treatment and was referred to the ED (requiring a one-day break in RT), where she thankfully had a negative cardiac/pulmonary workup. She was instructed to follow up with [name] as instructed, as well as her PCP and cardiologist for any other medical needs. She  should return to Rad Onc in 6-8 weeks, or earlier if needed.

## 2019-05-06 ENCOUNTER — Ambulatory Visit
Admission: RE | Admit: 2019-05-06 | Discharge: 2019-05-06 | Disposition: A | Discharge status: Home / Self Care | Payer: Medicare Other | Source: Ambulatory Visit | Attending: Surgery | Admitting: Surgery

## 2019-05-06 DIAGNOSIS — D0512 Intraductal carcinoma in situ of left breast: Secondary | ICD-10-CM | POA: Insufficient documentation

## 2019-05-06 DIAGNOSIS — Z170 Estrogen receptor positive status [ER+]: Secondary | ICD-10-CM | POA: Insufficient documentation

## 2019-05-11 ENCOUNTER — Encounter (INDEPENDENT_AMBULATORY_CARE_PROVIDER_SITE_OTHER): Payer: Self-pay

## 2019-05-13 ENCOUNTER — Ambulatory Visit: Payer: Self-pay | Admitting: Radiation Oncology

## 2019-05-13 DIAGNOSIS — D0512 Intraductal carcinoma in situ of left breast: Secondary | ICD-10-CM

## 2019-05-13 NOTE — Progress Notes (Signed)
RADIATION ONCOLOGY FOLLOW UP        Diagnosis:  Breast Cancer, left, stage I, [id number]                Previous Radiation Therapy:    She received accelerated fractionation whole breast radiotherapy covering the left breast at 42.4 Gy in 16 fractions, with 4 fraction boost to cumulative dose 52.4 Gray in 20 fractions.  Her low axilla was also covered with "high tangent" technique due to omission of axillary dissection.    Treatment was completed on 04/02/2019.      Interval History:    She returns today about 6 weeks out from completion of treatment.  He looks and feels remarkably well denying pain fevers chills cough nausea vomiting or shortness of breath.    She is pleased with her skin reactions and recovery, noting some residual hyperpigmentation over the breast and axilla as might be expected.  She asks about resuming shaving and deodorant in the left axillary region.    She is not on endocrine therapy, after discussion with Dr. Dorian Pod previously.  She has ER weakly positive, PR negative disease.    Mammogram is scheduled for October, coinciding with her next visit with Dr. Ardeen Garland team.                Current Medications:    Current Outpatient Medications:     atorvastatin (LIPITOR) 40 MG tablet, Take 1 tablet (40 mg total) by mouth daily. (Patient taking differently: Take 40 mg by mouth every evening  ), Disp: 90 tablet, Rfl: 3    cyanocobalamin ([employer] VITAMIN B12) 1000 MCG tablet, Take 1 tablet (1,000 mcg total) by mouth daily, Disp: 90 tablet, Rfl: 2    fluticasone (FLONASE) 50 MCG/ACT nasal spray, 2 sprays by Nasal route every evening  , Disp: , Rfl:     glucose blood (FREESTYLE LITE) test strip, Pt uses freestyle freedom lyte test strips testing BID prn, Disp: 300 each, Rfl: 3    Lancets (FREESTYLE) lancets, Use twice daily as needed, Disp: 300 each, Rfl: 3    lidocaine (LIDODERM) 5 %, Place 1 patch onto affected skin every twelve hours as needed for discomfort.  Remove each patch after 12 hours of  use, Disp: 30 patch, Rfl: 2    losartan (COZAAR) 25 MG tablet, Take 1 tablet (25 mg total) by mouth daily, Disp: 90 tablet, Rfl: 1    Magnesium 400 MG Tab, Take 400 mg by mouth daily, Disp: , Rfl:     metFORMIN (GLUCOPHAGE XR) 500 MG 24 hr tablet, Take 1 tablet (500 mg total) by mouth nightly, Disp: 90 tablet, Rfl: 3    metoprolol succinate XL (TOPROL-XL) 50 MG 24 hr tablet, Take 25 mg by mouth every evening  , Disp: , Rfl:     pantoprazole (PROTONIX) 40 MG tablet, Take 40 mg by mouth every evening, Disp: , Rfl:     Probiotic Product (PROBIOTIC-10 PO), Take by mouth, Disp: , Rfl:     SITagliptin-MetFORMIN HCl (JANUMET XR) 50-500 MG Tablet SR 24 hr, Take 1 tablet by mouth 2 (two) times daily with meals., Disp: 180 tablet, Rfl: 3    VITAMIN D PO, Take 500 Unit by mouth, Disp: , Rfl:           Allergies:  Allergies   Allergen Reactions    Ciprofloxacin Other (See Comments)     Severe Connective Tissue (joint) pain and stiffness    Nsaids Other (See Comments)  Pt states this causes her bleeding d/t PAN         Physical Exam:  KPS 90, pain score 0.    Breast Exam:   Focused examination of the left breast reveals mild to moderate residual hyperpigmentation, somewhat more pronounced in the nipple areole or region, with mild inferior breast edema.  No suspicious masses nodules or skin changes otherwise identified.  Cosmetic result is thus far good to excellent.  There is no significant retraction or volume loss noted.  No axillary or supraclavicular adenopathy noted.    Neurologic nonfocal.  Lower extremities reveal no edema.        Assessment/Plan:    She is doing well from our perspective 6 weeks out from completion of whole breast and low axillary radiotherapy for stage 1 left breast cancer, microinvasive with DCIS, ER weakly positive PR negative.    She is clinically NED with good recovery from acute effects of treatment, and is reassured regarding ongoing recovery from acute dermatitis.  Skin care moving  forward was reviewed.    She is aware of appropriate clinical and imaging surveillance moving forward.  She will continue to follow primarily with [name] as well as with her primary care physician.  I have offered her 6 to 87-month surveillance follow-up in our department, or she can be seen here as needed along as adequate follow-up is scheduled with the breast surgical team.    Thanks for allowing me to participate in care of this very pleasant woman.    Tonia Ghent, M.D.

## 2019-05-13 NOTE — Progress Notes (Signed)
05/13/19 1306   Comfort Alteration   Karnofsky Performance Score 90%-can perform normal activity, minor signs of disease   Fatigue 3   Pain Score 0   Nutrition Alteration   Weight 68 kg (150 lb)   Skin Alteration   Radiation Dermatitis 1-faint erythema or dry desquamation   Vital signs   Temp 97 F (36.1 C)   Temp Source Temporal   Heart Rate 86   BP 154/88

## 2019-05-21 ENCOUNTER — Encounter (INDEPENDENT_AMBULATORY_CARE_PROVIDER_SITE_OTHER): Payer: Self-pay | Admitting: Family Medicine

## 2019-05-21 ENCOUNTER — Ambulatory Visit (INDEPENDENT_AMBULATORY_CARE_PROVIDER_SITE_OTHER): Payer: Medicare Other | Admitting: Family Medicine

## 2019-05-21 VITALS — BP 123/72 | HR 75 | Temp 98.0°F | Ht 64.0 in | Wt 157.0 lb

## 2019-05-21 DIAGNOSIS — H52209 Unspecified astigmatism, unspecified eye: Secondary | ICD-10-CM | POA: Insufficient documentation

## 2019-05-21 DIAGNOSIS — M25559 Pain in unspecified hip: Secondary | ICD-10-CM | POA: Insufficient documentation

## 2019-05-21 DIAGNOSIS — N183 Chronic kidney disease, stage 3 unspecified: Secondary | ICD-10-CM

## 2019-05-21 DIAGNOSIS — E119 Type 2 diabetes mellitus without complications: Secondary | ICD-10-CM

## 2019-05-21 DIAGNOSIS — Z23 Encounter for immunization: Secondary | ICD-10-CM

## 2019-05-21 DIAGNOSIS — M797 Fibromyalgia: Secondary | ICD-10-CM | POA: Insufficient documentation

## 2019-05-21 DIAGNOSIS — L408 Other psoriasis: Secondary | ICD-10-CM | POA: Insufficient documentation

## 2019-05-21 DIAGNOSIS — R002 Palpitations: Secondary | ICD-10-CM | POA: Insufficient documentation

## 2019-05-21 DIAGNOSIS — M7050 Other bursitis of knee, unspecified knee: Secondary | ICD-10-CM | POA: Insufficient documentation

## 2019-05-21 LAB — POCT URINALYSIS DIPSTIX (10)(MULTI-TEST)
Bilirubin, UA POCT: NEGATIVE
Blood, UA POCT: NEGATIVE
Glucose, UA POCT: NEGATIVE mg/dL
Ketones, UA POCT: NEGATIVE mg/dL
Nitrite, UA POCT: NEGATIVE
POCT Leukocytes, UA: NEGATIVE
POCT Spec Gravity, UA: 1.015 (ref 1.001–1.035)
POCT pH, UA: 6 (ref 5–8)
Protein, UA POCT: 30 mg/dL — AB
Urobilinogen, UA: 0.2 mg/dL

## 2019-05-21 LAB — BASIC METABOLIC PANEL
Anion Gap: 13 (ref 5.0–15.0)
BUN: 25 mg/dL — ABNORMAL HIGH (ref 7.0–19.0)
CO2: 24 mEq/L (ref 21–29)
Calcium: 9.2 mg/dL (ref 8.5–10.5)
Chloride: 103 mEq/L (ref 100–111)
Creatinine: 1.3 mg/dL (ref 0.4–1.5)
Glucose: 165 mg/dL — ABNORMAL HIGH (ref 70–100)
Potassium: 4.9 mEq/L (ref 3.5–5.1)
Sodium: 140 mEq/L (ref 136–145)

## 2019-05-21 LAB — HEMOLYSIS INDEX: Hemolysis Index: 5 (ref 0–18)

## 2019-05-21 LAB — GFR: EGFR: 40.7

## 2019-05-21 LAB — POCT HEMOGLOBIN A1C: POCT Hgb A1C: 6.9 % — AB (ref 3.9–5.9)

## 2019-05-21 NOTE — Progress Notes (Signed)
Chief Complaint   Patient presents with    Diabetes     f/u    Breast Cancer     f/u          S:      Problem   Arthralgia of Hip   Astigmatism   Fibromyalgia    Pt. with poor sleep in last 2 months since starting back to work; will try 10 days of ambien to regulate sleep, which will help fibromyalgia symptoms.  Pt. with poor sleep in last 2 months since starting back to work; will try 10 days of ambien to regulate sleep, which will help fibromyalgia symptoms.     Pes Anserinus Bursitis    Left anserine bursa injected with kenalog with no complications and improvement of pain.  Left anserine bursa injected with kenalog with no complications and improvement of pain.     Palpitations   Other Psoriasis   Chronic Kidney Disease, Stage III (Moderate)    relatively stable CKD stage 3 w/ sCr ranging from 1.0 to 1.3 since 2003.  eGFR= 48.  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    11/2018:  Being followed by nephrology.  Potassium continues to run higher.  On losartan.  Eats a diet rich in potassium.  Hydrates ok but could do better.  1 caffeine daily and 3-5 glasses of wine weekly.    05/2019:  Stays well hydrated.  Has noted some foam in her urine. Due for renal check.  Exercise can be complicated by recovery and fatigue.  Eating well.         Diabetes Mellitus (Resolved)    type 2         Allergies   Allergen Reactions    Ciprofloxacin Other (See Comments)     Severe Connective Tissue (joint) pain and stiffness    Nsaids Other (See Comments)     Pt states this causes her bleeding d/t PAN       Current Outpatient Medications   Medication Sig Dispense Refill    atorvastatin (LIPITOR) 40 MG tablet Take 1 tablet (40 mg total) by mouth daily. (Patient taking differently: Take 40 mg by mouth every evening   ) 90 tablet 3    cyanocobalamin ([employer] VITAMIN B12) 1000 MCG tablet Take 1 tablet (1,000 mcg total) by mouth daily 90 tablet 2    fluticasone (FLONASE) 50  MCG/ACT nasal spray 2 sprays by Nasal route every evening         glucose blood (FREESTYLE LITE) test strip Pt uses freestyle freedom lyte test strips testing BID prn 300 each 3    Lancets (FREESTYLE) lancets Use twice daily as needed 300 each 3    losartan (COZAAR) 25 MG tablet Take 1 tablet (25 mg total) by mouth daily 90 tablet 1    Magnesium 400 MG Tab Take 400 mg by mouth daily      metFORMIN (GLUCOPHAGE XR) 500 MG 24 hr tablet Take 1 tablet (500 mg total) by mouth nightly 90 tablet 3    metoprolol succinate XL (TOPROL-XL) 25 MG 24 hr tablet Take 25 mg by mouth daily         pantoprazole (PROTONIX) 40 MG tablet Take 40 mg by mouth every evening      Probiotic Product (PROBIOTIC-10 PO) Take by mouth every other day        SITagliptin-MetFORMIN HCl (JANUMET XR) 50-500 MG Tablet SR 24 hr Take 1 tablet  by mouth 2 (two) times daily with meals. 180 tablet 3    VITAMIN D PO Take 500 Unit by mouth daily         lidocaine (LIDODERM) 5 % Place 1 patch onto affected skin every twelve hours as needed for discomfort.  Remove each patch after 12 hours of use 30 patch 2    metoprolol succinate XL (TOPROL-XL) 50 MG 24 hr tablet Take 25 mg by mouth every evening          No current facility-administered medications for this visit.        Review of Systems   Constitutional: Negative for fever and weight loss.   HENT: Negative for sore throat.    Respiratory: Negative for cough and shortness of breath.    Cardiovascular: Negative for chest pain and palpitations.   Gastrointestinal: Positive for diarrhea. Negative for nausea and vomiting.   Genitourinary: Positive for dysuria.   Musculoskeletal: Negative for myalgias.   Skin: Negative for rash.   Neurological: Negative for headaches.       All other systems were reviewed and are negative    O:  BP 123/72 (BP Site: Right arm, Patient Position: Sitting, Cuff Size: Medium)    Pulse 75    Temp 98 F (36.7 C) (Oral)    Ht 1.626 m (5\' 4" )    Wt 71.2 kg (157 lb)    BMI 26.95  kg/m , Body mass index is 26.95 kg/m.  Vital signs reviewed  GEN:  NAD, nonobese  NEURO:  CN2-12 without focal deficit, normal gait and station  HEENT:  normal eyes and ears  PULM:  unlabored respirations  SKIN:  dry, no visible rash      A/P:     1. Type 2 diabetes mellitus without complication, without long-term current use of insulin  POCT Hemoglobin A1C   2. Chronic kidney disease, stage III (moderate)  POCT UA Dipstix (10)(Multi-Test)    Basic Metabolic Panel    Urine culture   3. Need for vaccination  Flu vacc quad recombinant pres free 18 yrs& up         Risk & Benefits of the new medication(s) were explained to the patient (and family) who verbalized understanding & agreed to the treatment plan. Patient (family) encouraged to contact me/clinical staff with any questions/concerns    Raj Janus, MD

## 2019-05-21 NOTE — Progress Notes (Signed)
Have you seen any specialists/other providers since your last visit with [place]?    No    Arm preference verified?   Yes    The patient is due for influenza vaccine, shingles vaccine, pneumonia vaccine and [facility] letter

## 2019-05-23 ENCOUNTER — Ambulatory Visit: Payer: Medicare Other

## 2019-05-23 ENCOUNTER — Encounter (HOSPITAL_BASED_OUTPATIENT_CLINIC_OR_DEPARTMENT_OTHER): Payer: Self-pay | Admitting: Surgery

## 2019-05-23 ENCOUNTER — Ambulatory Visit (INDEPENDENT_AMBULATORY_CARE_PROVIDER_SITE_OTHER): Payer: Medicare Other | Admitting: Surgery

## 2019-05-23 VITALS — BP 132/82 | HR 73 | Temp 97.6°F | Ht 64.0 in | Wt 157.2 lb

## 2019-05-23 DIAGNOSIS — D0512 Intraductal carcinoma in situ of left breast: Secondary | ICD-10-CM

## 2019-05-23 NOTE — Progress Notes (Signed)
Treatment Team  Ref Prov:  Primary Care Physician:  Raj Janus, MD Radiology facility:[facility])--[phone] Breast Surgeon :Henderson [place] MD  [phone]   Plastic Surgeonnone Radiation Onc:  Miguel Dibble MD: [phone] [facility] [place]); Medical Onc:  Laney Pastor, MD [phone] [facility] [place])      Breast Cancer Comprehensive Care Plan Summary  Date of diagnosis: 11/03/2018 Event : DCIS ECOG Performance:Grade 0 (Fully active) Genetic Testing:not indicated   Presentation :Abnormal mammogram with microcalcifications Side: left Focality: unifocal Location:radian 12:00   Biologic Tumor characteristics  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 - not indicated Oncotype Dx:  DCIS , not indicated   Estrogen Receptor: positive 19 % Progesterone Receptor: negative Her-2-neu Receptor: DCIS, not indicated Androgen Receptor;  not done   Staging  Tumor Size:   DCIS with <66mm microinvasion Nodes: clinically/US negative Systemic Metastases: clinically negative Stage: Stage0 , Tmic N0 M0   Treatment  Modality Date    Surgery   12/25/18    01/15/2019 left, partial mastectomy with needle localization    Re-excision lumpectomy    Radiation  Whole breast radiation completed on 04/02/2019   Endocrine  not felt to be warranted as the patient was only weakly ER positive   Chemotherapy     Biologic     Clinical trial       Chief complaint : Follow-up left DCIS with microinvasion    History of Present Illness    [name] is a 68 y.o. female patient referred for follow up of her history of left DCIS with microinvasion originally diagnosed in February, 2020.  She is currently on No endocrine therapy    The patient is asymptomatic and denies any breast pain , palpable masses, skin or nipple changes or nipple discharge.  She finished her radiation in June 2020.  She has not had a follow-up mammogram since then    The following  portions of the patient's history were reviewed and updated as appropriate: allergies, current medications, past family history, past medical history, past social history, past surgical history and problem list.     has a past medical history of Anxiety, Arrhythmia, Chronic kidney disease, Diabetes mellitus, type 2, Gastroesophageal reflux disease, Hemorrhagic diathesis (06/2002), Hyperlipidemia, Hypertension, Malignant neoplasm of breast (2020), PAN (polyarteritis nodosa) (2003), and Sleep apnea.    Review of Systems  Pertinent items are noted in HPI.    All other systems were reviewed and are negative.      Physical Exam   BP 132/82    Pulse 73    Temp 97.6 F (36.4 C) (Temporal)    Ht 1.626 m (5\' 4" )    Wt 71.3 kg (157 lb 3.2 oz)    BMI 26.98 kg/m     WDWN female in NAD  HEENT: Eyes- Clear, no scleral icterus,      Ears/Nose- WNL Hearing-WNL      Mouth-WNL  Neck:  No thyromegaly or masses, trachea midline  Chest: Respiratory effort normal   Palpation of the chest wall: No tenderness, asymmetry, or crepitus    BJM:  Gait and station normal  Ext:  No CCE,   Upper Extremity Range of Motion: WNL   Skin:  Free of significant ulcers or lesions  Neuro:  Grossly intact, no focal findings, alert and oriented, asks appropriate questions  LN:  No axillary, supraclavicular or cervical adenopathy  Breast:    Symmetry:Symmetric     Right breast:  Presence:Present    Skin:Skin color, texture, turgor normal. No rashes or lesions.     Nipple:normal    Tissue: Within normal limits     Left breast:     Presence:Present    Skin: Status post radiation change    Nipple:normal    Tissue: The periareolar incision is healed well she does have some mild edema and induration    Assessment/Plan  Personal history of breast carcinoma    Patient is doing well. She currently has no evidence of disease .  Her physical and emotional recovery is good. Cosmetic outcome is good .  I have recommended continued treatment with No endocrine  therapy    Surveillance / Folllow up recommendations:    I reviewed the importance of breast exams and follow up at six month intervals for the first five years after the first treatment; alternating exams at 6 month intervals with medical oncology is a good option between years three and five to limit the number of times needed for follow up ; and every year thereafter.  I have recommended long term follow-up because there is a low but measurable risk of breast cancer recurrence even more than 10 years after treatment. In addition, if  there is remaining breast tissue, there is a higher risk to develop a new, independent, breast cancer at some time in the future.    After lumpectomy, we recommend mammograms once a year. The purpose is to monitor for local recurrence and detection of new primary.  Diagnostic bilateral mammography in 6 months    Patient is advised to report these symptoms : new lumps, bone pain, chest pain, shortness of breath or difficulty breathing, abdominal pain, or persistent headaches.     The following tests are not recommended for routine breast cancer follow-up: breast MRI, FDG-PET scans, complete blood cell counts, automated chemistry studies, chest x-rays, bone scans, liver ultrasound, and tumor markers (CA 15-3, CA 27.29, CEA).     Nutrition  I have also discussed life-style choices to decrease risk of breast cancer such as:   Healthy eating habits, meals high in fruits, vegetables, nuts and complex carbohydrates, low in animal fats.  Limit the amount of alcohol ingested   Avoid  Hormone Replacement Therapy.    Exercise  Continue to exercise, aerobic 30 - 40 minutes , 3 -4 times / week    Weight Management-  Screened- Stable, no current issue    Upper Extremity Range of Motion/Lymphedema  I have encouraged her to continue to use her left arm to optimize range of motion    Insomnia/Sleep  Screened- Stable, no current issue    Follow up  Follow up visit with me  in 6 months  Follow up with  Medical Oncology and Radiation Oncology.    Greater than 15 minutes was spent on this encounter with > 50% spent face to face with the patient.

## 2019-05-23 NOTE — Progress Notes (Signed)
Please call pt to inform them that their lab data showed a controlled sugar with slight worsening in kidney function.  The culture was equivocal and if she continues with symptoms, we can repeat the test which likely shows contamination.

## 2019-05-27 ENCOUNTER — Encounter (INDEPENDENT_AMBULATORY_CARE_PROVIDER_SITE_OTHER): Payer: Self-pay | Admitting: Family Medicine

## 2019-06-03 ENCOUNTER — Other Ambulatory Visit (INDEPENDENT_AMBULATORY_CARE_PROVIDER_SITE_OTHER): Payer: Self-pay | Admitting: Family Medicine

## 2019-06-03 MED ORDER — LOSARTAN POTASSIUM 25 MG PO TABS
25.0000 mg | ORAL_TABLET | Freq: Every day | ORAL | 1 refills | Status: DC
Start: 2019-06-03 — End: 2020-08-18

## 2019-06-03 NOTE — Telephone Encounter (Signed)
Last awv 10/09/2018.

## 2019-06-05 ENCOUNTER — Encounter (INDEPENDENT_AMBULATORY_CARE_PROVIDER_SITE_OTHER): Payer: Self-pay

## 2019-06-11 ENCOUNTER — Encounter (INDEPENDENT_AMBULATORY_CARE_PROVIDER_SITE_OTHER): Payer: Self-pay

## 2019-06-11 ENCOUNTER — Encounter (INDEPENDENT_AMBULATORY_CARE_PROVIDER_SITE_OTHER): Payer: Self-pay | Admitting: Family Medicine

## 2019-06-18 ENCOUNTER — Encounter (INDEPENDENT_AMBULATORY_CARE_PROVIDER_SITE_OTHER): Payer: Self-pay | Admitting: Family Medicine

## 2019-06-18 DIAGNOSIS — R197 Diarrhea, unspecified: Secondary | ICD-10-CM

## 2019-06-21 DIAGNOSIS — K649 Unspecified hemorrhoids: Secondary | ICD-10-CM | POA: Insufficient documentation

## 2019-06-25 ENCOUNTER — Telehealth (INDEPENDENT_AMBULATORY_CARE_PROVIDER_SITE_OTHER): Payer: Medicare Other | Admitting: Nurse Practitioner

## 2019-06-25 ENCOUNTER — Ambulatory Visit (FREE_STANDING_LABORATORY_FACILITY): Payer: Medicare Other

## 2019-06-25 ENCOUNTER — Encounter (INDEPENDENT_AMBULATORY_CARE_PROVIDER_SITE_OTHER): Payer: Self-pay

## 2019-06-25 VITALS — Ht 64.0 in | Wt 148.0 lb

## 2019-06-25 DIAGNOSIS — R300 Dysuria: Secondary | ICD-10-CM

## 2019-06-25 DIAGNOSIS — N3000 Acute cystitis without hematuria: Secondary | ICD-10-CM

## 2019-06-25 LAB — POCT URINALYSIS AUTOMATED (IAH)
Bilirubin, UA POCT: NEGATIVE
Blood, UA POCT: NEGATIVE
Glucose, UA POCT: NEGATIVE
Ketones, UA POCT: NEGATIVE mg/dL
Nitrite, UA POCT: NEGATIVE
PH, UA POCT: 7 (ref 4.6–8)
Protein, UA POCT: NEGATIVE mg/dL
Specific Gravity, UA POCT: 1.01 mg/dL (ref 1.001–1.035)
Urobilinogen, UA POCT: 0.2 mg/dL

## 2019-06-25 MED ORDER — PHENAZOPYRIDINE HCL 200 MG PO TABS
200.00 mg | ORAL_TABLET | Freq: Three times a day (TID) | ORAL | 0 refills | Status: AC | PRN
Start: 2019-06-25 — End: 2019-06-29

## 2019-06-25 MED ORDER — NITROFURANTOIN MONOHYD MACRO 100 MG PO CAPS
100.00 mg | ORAL_CAPSULE | Freq: Two times a day (BID) | ORAL | 0 refills | Status: AC
Start: 2019-06-25 — End: 2019-06-30
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
## 2019-06-25 NOTE — Patient Instructions (Signed)
Dysuria  Dysuria is when you have pain during urination. It is often described as a burning feeling. Learn more about this problem and how it can be treated.      Painful urination (dysuria) is often caused by a problem in the urinary tract.   What causes dysuria?  Possible causes include:   Infection with a bacteria or virus such as a urinary tract infection (UTI) or a sexually transmittedinfection (STI)   Sensitivity or allergy to chemicals such as those found in lotions and other products   Prostate or bladder problems   Radiation therapy to the pelvic area  How is dysuria diagnosed?  Your healthcare provider will examine you. He or she will ask about your symptoms and health. After talking with you and doing a physical exam, your healthcare provider may know what is causing your dysuria. You will often have to give a urine sample. Tests of your urine (urinalysis) are done. A urinalysis may include:    Looking at the urine sample (visual exam)   Checking for substances (chemical exam)   Looking at a small amount of the urine under a microscope (microscopic exam)  Some parts of the urinalysis may be done in the provider's office and some in a lab. The urine sample may also be checked for bacteria and yeast (urine culture).Your healthcare provider will tell you more about these tests if they are needed.   How is dysuria treated?  Treatment depends on the cause. If you have a bacterial infection, you may need antibiotics. You may be given medicines to make it easier for you to urinate and help ease pain. Your healthcare provider can tell you more about your treatment options. If not treated, symptoms may get worse.   When to call your healthcare provider  Call the healthcare provider right away if you have any of the following:    Fever of 100.4 F( 38C) or higher, or as directed by your provider   No improvement after 3 days of treatment   Trouble urinating because of pain   New or increased  discharge from the vagina or penis   Rash or joint pain   Increased back or belly pain   Enlarged painful lymph nodes (lumps) in the groin  [employer] last reviewed this educational content on 01/01/2018   2000-2020 [employer] [address], [place] [address]. All rights reserved. This information is not intended as a substitute for professional medical care. Always follow your healthcare professional's instructions.        Understanding Urinary Tract Infections (UTIs)   Most UTIs are caused by bacteria, but they may also be caused by viruses or fungi. Bacteria from the bowel are the most common source of infection.The infection may start because of any of the following:    Sexual activity.  During sex, bacteria can travel from the penis, vagina, or rectum into the urethra.   Bacteria outside the rectum getting into the urethra.  Bacteria on the skinoutside the rectum may travel into the urethra. This is more common in women since the rectum and urethra are closer to each other than in men. Wiping from front to back after using the toilet and keeping the area clean can help prevent germs from getting to the urethra.   Blocked urine flow through the urinary tract. If urine sits too long, germs may start to grow out of control.      Parts of the urinary tract    The infection canoccur in any part of the urinary tract.    The kidneys.  These organs collect and store urine.   The ureters. These tubes carry urine from the kidneys to the bladder.   The bladder.  This holds urine until you are ready to let it out.   The urethra. This tube carries urine from the bladder out of the body. It is shorter in women, so bacteria can move throughit more easily.The urethra is longer in men, so a UTI is less likely to reach the bladder or kidneys in men.  [employer] last reviewed this educational content on 10/03/2018   2000-2020 [employer] [address], [place] [address].  All rights reserved. This information is not intended as a substitute for professional medical care. Always follow your healthcare professional's instructions.

## 2019-06-27 ENCOUNTER — Telehealth (INDEPENDENT_AMBULATORY_CARE_PROVIDER_SITE_OTHER): Payer: Self-pay

## 2019-06-27 ENCOUNTER — Encounter (INDEPENDENT_AMBULATORY_CARE_PROVIDER_SITE_OTHER): Payer: Self-pay | Admitting: Family Medicine

## 2019-06-27 ENCOUNTER — Telehealth (INDEPENDENT_AMBULATORY_CARE_PROVIDER_SITE_OTHER): Payer: Self-pay | Admitting: Family Medicine

## 2019-06-27 MED ORDER — CEFDINIR 300 MG PO CAPS
300.00 mg | ORAL_CAPSULE | Freq: Two times a day (BID) | ORAL | 0 refills | Status: AC
Start: 2019-06-27 — End: 2019-07-04

## 2019-06-27 NOTE — Telephone Encounter (Signed)
Spoke to patient will change antibiotic to cefdinir for 7 days; discussed pending urine culture.  Pt understands.

## 2019-06-27 NOTE — Telephone Encounter (Signed)
Spoke to patient, she is concerned that she should not be taking macrobid due to stage 3 kidney cancer. Urine culture is prelim, pending susceptibility testing. Please advise.

## 2019-07-03 NOTE — Telephone Encounter (Signed)
Was this taken care of?  Haven't worked since 9/24 and didn't see till now.

## 2019-07-07 ENCOUNTER — Encounter (INDEPENDENT_AMBULATORY_CARE_PROVIDER_SITE_OTHER): Payer: Self-pay | Admitting: Family Medicine

## 2019-07-07 ENCOUNTER — Telehealth (INDEPENDENT_AMBULATORY_CARE_PROVIDER_SITE_OTHER): Payer: Medicare Other | Admitting: Family Medicine

## 2019-07-07 ENCOUNTER — Ambulatory Visit (FREE_STANDING_LABORATORY_FACILITY): Payer: Medicare Other

## 2019-07-07 DIAGNOSIS — N390 Urinary tract infection, site not specified: Secondary | ICD-10-CM

## 2019-07-07 DIAGNOSIS — R300 Dysuria: Secondary | ICD-10-CM

## 2019-07-07 DIAGNOSIS — N9989 Other postprocedural complications and disorders of genitourinary system: Secondary | ICD-10-CM

## 2019-07-07 LAB — POCT URINALYSIS AUTOMATED (IAH)
Bilirubin, UA POCT: NEGATIVE
Glucose, UA POCT: NEGATIVE
Ketones, UA POCT: NEGATIVE mg/dL
Nitrite, UA POCT: NEGATIVE
PH, UA POCT: 6.5 (ref 4.6–8)
Protein, UA POCT: NEGATIVE mg/dL
Specific Gravity, UA POCT: 1.01 mg/dL (ref 1.001–1.035)
Urobilinogen, UA POCT: 0.2 mg/dL

## 2019-07-07 MED ORDER — CEFDINIR 300 MG PO CAPS
300.00 mg | ORAL_CAPSULE | Freq: Two times a day (BID) | ORAL | 0 refills | Status: AC
Start: 2019-07-07 — End: 2019-07-14

## 2019-07-07 NOTE — Patient Instructions (Addendum)
Bladder Infection, Female (Adult)     Urine normally doesn't have any germs (bacteria) in it. But bacteria can get into the urinary tract from the skin around the rectum. Or they can travel in the blood from other parts of the body. Once they are in your urinary tract, they can cause infection in these areas:  · The urethra (urethritis)  · The bladder (cystitis)  · The kidneys (pyelonephritis)  The most common place for an infection is in the bladder. This is called a bladder infection. This is one of the most common infections in women. Most bladder infections are easily treated. They are not serious unless the infection spreads to the kidney.  The terms bladder infection, UTI, and cystitis are often used to describe the same thing. But they are not always the same. Cystitis is an inflammation of the bladder. The most common cause of cystitis is an infection.  Symptoms  The infection causes inflammation in the urethra and bladder. This causes many of the symptoms. The most common symptoms of a bladder infection are:  · Pain or burning when urinating  · Having to urinate more often than normal  · Urgent need to urinate  · Only a small amount of urine comes out  · Blood in urine  · Belly (abdominal) discomfort. This is often in the lower belly above the pubic bone.  · Cloudy urine  · Strong- or bad-smelling urine  · Unable to urinate (urinary retention)  · Unable to hold urine in (urinary incontinence)  · Fever  · Loss of appetite  · Confusion (in older adults)  Causes  Bladder infections are not contagious. You can't get one from someone else, from a toilet seat, or from sharing a bath.  The most common cause of bladder infections is bacteria from the bowels. The bacteria get onto the skin around the opening of the urethra. From there, they can get into the urine. Then they travel up to the bladder, causing inflammation and infection. This often happens because of:  · Wiping incorrectly after urinating. Always  wipe from front to back.  · Bowel incontinence  · Pregnancy  · Procedures such as having a catheter put in  · Older age  · Not emptying your bladder. This can give bacteria a chance to grow in your urine.  · Fluid loss (dehydration)  · Constipation  · Having sex  · Using a diaphragm for birth control   Treatment  Bladder infections are diagnosed by a urine test and urine culture. They are treated with antibiotics. They often clear up quickly without problems. Treatment helps prevent a more serious kidney infection.  Medicines  Medicines can help in the treatment of a bladder infection:  · Take antibiotics until they are used up, even if you feel better. It's important to finish them to make sure the infection has cleared.  · You can use acetaminophen or ibuprofen for pain, fever, or discomfort, unless another medicine was prescribed. If you have long-term (chronic) liver or kidney disease, talk with your healthcare provider before using these medicines. Also talk with your provider if you've ever had a stomach ulcer or GI (gastrointestinal) bleeding, or are taking blood-thinner medicines.  · If you are given phenazopydridine to reduce burning with urination, it will make your urine a bright orange color. This can stain clothing.  Care and prevention  These self-care steps can help prevent future infections:  · Drink plenty of fluids. This helps to prevent dehydration and flush out your bladder. Do this unless   you must restrict fluids for other health reasons, or your healthcare provider told you not to.   Clean yourself correctly after going to the bathroom. Wipe from front to back after using the toilet. This helps prevent the spread of bacteria.   Urinate more often. Don't try to hold urine in for a long time.   Wear loose-fitting clothes and cotton underwear. Don't wear tight-fitting pants.   Improve your diet and prevent constipation. Eat more fresh fruits and vegetables, andfiber. Eat less junk foods and  fatty foods.   Don't have sex until your symptoms are gone.   Don't have caffeine, alcohol, and spicy foods. These can irritate your bladder.   Urinate right after you have sex to flush out your bladder.   If you use birth control pills and have frequent bladder infections, discuss it with your healthcare provider.  Follow-up care  Call your healthcare provider if all symptoms are not gone after 3 days of treatment. This is especially important if you have repeat infections.  If a culture was done, you will be told if your treatment needs to be changed. If directed, you can callto find out the results.  If X-rays were done, you will be told if the results will affect yourtreatment.  Call 911  Call 911 if any of the following occur:   Trouble breathing   Hard to wake up orconfusion   Fainting (loss of consciousness)   Fast heart rate  When to get medical advice  Call your healthcare provider right away if any of these occur:   Fever of 100.51F (38.0C) or higher, or as directed by your healthcare provider   Symptoms are not betterafter 3 days of treatment   Back or belly pain that gets worse   Repeated vomiting, or unable to keep medicine down   Weakness or dizziness   Vaginal discharge   Pain, redness, or swelling in the outer vaginal area (labia)  [employer]Well last reviewed this educational content on 08/03/2018   2000-2020 [employer]. [address], [place], [place] [address]. All rights reserved. This information is not intended as a substitute for professional medical care. Always follow your healthcare professional's instructions.      Please go directly to [facility] for urine sample testing.
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
## 2019-07-08 NOTE — Telephone Encounter (Signed)
Yes, Dr. Gilmore Laroche spoke with the patient and changed her medication. Thanks for following up.

## 2019-07-09 ENCOUNTER — Encounter (INDEPENDENT_AMBULATORY_CARE_PROVIDER_SITE_OTHER): Payer: Self-pay | Admitting: Family Medicine

## 2019-07-11 ENCOUNTER — Encounter (INDEPENDENT_AMBULATORY_CARE_PROVIDER_SITE_OTHER): Payer: Self-pay

## 2019-07-18 ENCOUNTER — Other Ambulatory Visit: Payer: Self-pay | Admitting: Family Nurse Practitioner

## 2019-07-19 ENCOUNTER — Encounter (HOSPITAL_BASED_OUTPATIENT_CLINIC_OR_DEPARTMENT_OTHER): Payer: Self-pay | Admitting: Family Nurse Practitioner

## 2019-07-19 NOTE — Progress Notes (Signed)
[facility] Breast Cancer Follow Up    Treatment Team  Ref Prov:  Primary Care Physician:  Raj Janus, MD Radiology facility:[facility])--[phone] Breast Surgeon :Henderson Baltimore MD  [phone]   Plastic Surgeonnone Radiation Onc:  Miguel Dibble MD: [phone] [facility] [place]); Medical Onc:  Laney Pastor, MD [phone] [facility] [place])      Breast Cancer Comprehensive Care Plan Summary  Date of diagnosis: 11/03/2018 Event : DCIS ECOG Performance:Grade 0 (Fully active) Genetic Testing:not indicated   Presentation :Abnormal mammogram with microcalcifications Side: left Focality: unifocal Location:radian 12:00   Biologic Tumor characteristics  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 - not indicated Oncotype Dx:  DCIS , not indicated   Estrogen Receptor: positive 19 % Progesterone Receptor: negative Her-2-neu Receptor: DCIS, not indicated Androgen Receptor;  not done   Staging  Tumor Size:   DCIS with <26mm microinvasion Nodes: clinically/US negative Systemic Metastases: clinically negative Stage: Stage0 , Tmic N0 M0   Treatment  Modality Date    Surgery   12/25/18    01/15/2019 left, partial mastectomy with needle localization    Re-excision lumpectomy    Radiation  Whole breast radiation completed on 04/02/2019   Endocrine  not felt to be warranted as the patient was only weakly ER positive   Chemotherapy     Biologic     Clinical trial         HPI   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 female patient here for follow up of her history of left breast cancer originally diagnosed in February 2020.  She is currently on No endocrine therapy    The patient is asymptomatic and denies any breast pain , palpable masses, skin or nipple changes or nipple discharge.     Recent Imaging:  LDM 07/18/19: No mammographic evidence for malignancy. BIRADS2    The following portions of the  patient's history were reviewed and updated as appropriate: allergies, current medications, past family history, past medical history, past social history, past surgical history and problem list.     has a past medical history of Anxiety, Arrhythmia, Chronic kidney disease, Diabetes mellitus, type 2, Gastroesophageal reflux disease, Hemorrhagic diathesis (06/2002), Hyperlipidemia, Hypertension, Malignant neoplasm of breast (2020), PAN (polyarteritis nodosa) (2003), and Sleep apnea.    Family History     Family History   Problem Relation Age of Onset   . Diabetes Mother    . Thyroid disease Mother    . No known problems Father         Review of Systems   ROS:  Constitutional:  negative  HEENT:  negative  Cardiovascular:  negative  Respiratory:  negative  Gastrointestinal:  negative  Genitourinary:  negative  Musculoskeletal:  negative  Skin:  negative  Neurologic:  negative  Psychiatric:  negative  Endocrine:  negative  Hematologic:  negative                       Physical Exam   BP 137/79   Pulse 71   Temp 97.7 F (36.5 C) (Temporal)   Ht 1.626 m (5\' 4" )   Wt 70.9 kg (156 lb 3.2 oz)   BMI 26.81 kg/m     WDWN female in NAD  HEENT:  Clear, no scleral icterus, neck supple  Neck:  No thyromegaly or masses, trachea midline  Chest:  Respiratory effort normal  BJM:  Gait and station normal  Ext:  No CCE  Skin:  Free of significant ulcers or lesions  Neuro:  Grossly intact, no focal findings, alert and oriented, asks appropriate questions  LN:  No axillary, supraclavicular or cervical adenopathy  Breast:  Symmetric .    Right breast: No skin or nipple changes, no nipple retraction or discharge . No palpable masses    Left breast : No skin or nipple changes, no nipple retraction or discharge . No palpable masses. Well healed incision. Mild radiation change.    Rads   Radiological imaging and reports reviewed as above.     Assessment/Plan   1. Personal history of breast carcinoma    Patient is doing well. She currently  has no evidence of disease .  Her physical and emotional recovery is good. Cosmetic outcome is good .  I have recommended continued treatment with Serial imaging    Surveillance / Folllow up recommendations:    I reviewed the importance of breast exams and follow up at six month intervals for the first five years after the first treatment; alternating exams at 6 month intervals with medical oncology is a good option between years three and five to limit the number of times needed for follow up ; and every year thereafter.  I have recommended long term follow-up because there is a low but measurable risk of breast cancer recurrence even more than 10 years after treatment. In addition, if  there is remaining breast tissue, there is a higher risk to develop a new, independent, breast cancer at some time in the future.    After lumpectomy, we recommend mammograms once a year. The purpose is to monitor for local recurrence and detection of new primary.    Patient is advised to report these symptoms : new lumps, bone pain, chest pain, shortness of breath or difficulty breathing, abdominal pain, or persistent headaches.     The following tests are not recommended for routine breast cancer follow-up: breast MRI, FDG-PET scans, complete blood cell counts, automated chemistry studies, chest x-rays, bone scans, liver ultrasound, and tumor markers (CA 15-3, CA 27.29, CEA).     Nutrition  I have also discussed life-style choices to decrease risk of breast cancer such as:   Healthy eating habits, meals high in fruits, vegetables, nuts and complex carbohydrates, low in animal fats.  Limit the amount of alcohol ingested   Avoid  Hormone Replacement Therapy.    Exercise  Continue to exercise, aerobic 30 - 40 minutes , 3 -4 times / week    Follow up  I have provided the patient with the following educational materials:  Other- treatment summary  Bilateral mammogram in January 2021  Follow up visit with me  in 4 months, sooner if  concerns  Follow up with Medical Oncology and Radiation Oncology.  Patient agrees with plan.     Greater than 40 minutes was spent on this encounter with > 50% spent face to face with the patient on care coordination and counseling.

## 2019-07-24 ENCOUNTER — Ambulatory Visit (HOSPITAL_BASED_OUTPATIENT_CLINIC_OR_DEPARTMENT_OTHER): Payer: Medicare Other | Admitting: Family Nurse Practitioner

## 2019-07-25 ENCOUNTER — Encounter (HOSPITAL_BASED_OUTPATIENT_CLINIC_OR_DEPARTMENT_OTHER): Payer: Self-pay | Admitting: Family Nurse Practitioner

## 2019-07-25 ENCOUNTER — Ambulatory Visit (INDEPENDENT_AMBULATORY_CARE_PROVIDER_SITE_OTHER): Payer: Medicare Other | Admitting: Family Nurse Practitioner

## 2019-07-25 VITALS — BP 137/79 | HR 71 | Temp 97.7°F | Ht 64.0 in | Wt 156.2 lb

## 2019-07-25 DIAGNOSIS — D0512 Intraductal carcinoma in situ of left breast: Secondary | ICD-10-CM

## 2019-08-09 DIAGNOSIS — Z853 Personal history of malignant neoplasm of breast: Secondary | ICD-10-CM | POA: Insufficient documentation

## 2019-08-11 ENCOUNTER — Encounter (INDEPENDENT_AMBULATORY_CARE_PROVIDER_SITE_OTHER): Payer: Self-pay

## 2019-08-27 ENCOUNTER — Encounter (INDEPENDENT_AMBULATORY_CARE_PROVIDER_SITE_OTHER): Payer: Self-pay

## 2019-09-10 ENCOUNTER — Encounter (INDEPENDENT_AMBULATORY_CARE_PROVIDER_SITE_OTHER): Payer: Self-pay

## 2019-09-11 ENCOUNTER — Encounter (INDEPENDENT_AMBULATORY_CARE_PROVIDER_SITE_OTHER): Payer: Self-pay | Admitting: Family Medicine

## 2019-09-11 DIAGNOSIS — E119 Type 2 diabetes mellitus without complications: Secondary | ICD-10-CM

## 2019-09-11 MED ORDER — JANUMET XR 50-500 MG PO TB24
1.00 | ORAL_TABLET | Freq: Two times a day (BID) | ORAL | 3 refills | Status: DC
Start: 2019-09-11 — End: 2019-09-18

## 2019-09-11 NOTE — Progress Notes (Signed)
LOV & A1C- 8.18.2020

## 2019-09-18 ENCOUNTER — Telehealth (INDEPENDENT_AMBULATORY_CARE_PROVIDER_SITE_OTHER): Payer: Self-pay | Admitting: Family Medicine

## 2019-09-18 DIAGNOSIS — E119 Type 2 diabetes mellitus without complications: Secondary | ICD-10-CM

## 2019-09-18 MED ORDER — JANUMET XR 50-500 MG PO TB24
1.00 | ORAL_TABLET | Freq: Two times a day (BID) | ORAL | 3 refills | Status: AC
Start: 2019-09-18 — End: ?

## 2019-09-18 NOTE — Telephone Encounter (Signed)
Pt is calling in reference to her medication. Per pt the pharmacy was incorrect in her profile. I updated her pharmacy in her profile.     Can the medication please be resent to correct pharmacy.    SITagliptin-MetFORMIN HCl (Janumet XR) 50-500 MG Tablet SR 24 hr      [facility] [address] - [place], [place] - [address] [facility] [phone] (Phone)  [phone] (Fax)       Pt can be reached at [phone]. Please advise.

## 2019-09-21 ENCOUNTER — Encounter (INDEPENDENT_AMBULATORY_CARE_PROVIDER_SITE_OTHER): Payer: Self-pay | Admitting: Nurse Practitioner

## 2019-09-21 ENCOUNTER — Telehealth (INDEPENDENT_AMBULATORY_CARE_PROVIDER_SITE_OTHER): Payer: Medicare Other | Admitting: Nurse Practitioner

## 2019-09-21 DIAGNOSIS — J0140 Acute pansinusitis, unspecified: Secondary | ICD-10-CM

## 2019-09-21 MED ORDER — AMOXICILLIN-POT CLAVULANATE 875-125 MG PO TABS
1.00 | ORAL_TABLET | Freq: Two times a day (BID) | ORAL | 0 refills | Status: AC
Start: 2019-09-21 — End: 2019-10-01
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
## 2019-09-21 NOTE — Patient Instructions (Signed)
Acute Bacterial Rhinosinusitis (ABRS)    Acute bacterial rhinosinusitis (ABRS) is an infection of your nasal cavity and sinuses. It's caused by bacteria. Acute means that you've had symptoms for less than 4 weeks, but possibly up to 12 weeks.  Understanding your sinuses  The nasal cavity is the large air-filled space behind your nose. The sinuses are a group of spaces formed by the bones of your face. They connect with your nasal cavity. ABRS causes the tissue lining these spaces to become inflamed. Mucus may not drain normally. This leads to facial pain and other symptoms.  What causes ABRS?  ABRS most often follows an upper respiratory infection caused by a virus. Bacteria then infect the lining of your nasal cavity and sinuses. But you can also get ABRS if you have:   Nasal allergies   Long-term nasal swelling and congestion not caused by allergies   Blockage in the nose  Symptoms of ABRS  The symptoms of ABRS may be different for each person and include:   Nasal congestion or blockage   Pain or pressure in the face   Thick, colored drainage from the nose  Other symptoms may include:   Runny nose   Fluid draining from the nose down the throat (postnasal drip)   Headache   Cough   Pain   Fever  Diagnosing ABRS  ABRS may be diagnosed if you've had an upper respiratory infection like a cold and cough for 10 or more days without improvement or with worsening symptoms. Your healthcare provider will ask about your symptoms and your medical history. The provider will check your vital signs, including your temperature. You'll have a physical exam. The healthcare provider will check your ears, nose, and throat. You likely won't need any tests. If ABRS comes back, you may have a culture or other tests.  Treatment for ABRS  Treatment may include:   Antibiotic medicine. This is for symptoms that last for at least 10 to 14 days.   Nasal corticosteroid medicine. Drops or spray used in the nose can lessen  swelling and congestion.   Over-the-counter pain medicine. This is to lessen sinus pain and pressure.   Nasal decongestant medicine. Spray or drops may help to lessen congestion. Do not use them for more than a few days.   Salt wash (saline irrigation). This can help to loosen mucus.  Possible complications of ABRS  ABRS may come back or become long-term (chronic). In rare cases, ABRS may cause complications such as:   Inflamed tissue around the brain and spinal cord (meningitis)   Inflamed tissue around the eyes (orbital cellulitis)   Inflamed bones around the sinuses (osteitis)  These problems may need to be treated in a hospital with intravenous (IV) antibiotic medicine or surgery.  When to call the healthcare provider  Call your healthcare provider if you have any of the following:   Symptoms that don't get better, or get worse   Symptoms that don't get better after 3 to 5 days on antibiotics   Trouble seeing   Swelling around your eyes   Confusion or trouble staying awake   [employer] last reviewed this educational content on 02/01/2016   2000-2020 [employer] [address], [place] [address]. All rights reserved. This information is not intended as a substitute for professional medical care. Always follow your healthcare professional's instructions.

## 2019-10-01 ENCOUNTER — Encounter (INDEPENDENT_AMBULATORY_CARE_PROVIDER_SITE_OTHER): Payer: Self-pay | Admitting: Family Medicine

## 2019-10-01 DIAGNOSIS — E119 Type 2 diabetes mellitus without complications: Secondary | ICD-10-CM

## 2019-10-01 DIAGNOSIS — E7800 Pure hypercholesterolemia, unspecified: Secondary | ICD-10-CM

## 2019-10-01 MED ORDER — PANTOPRAZOLE SODIUM 40 MG PO TBEC
40.00 mg | DELAYED_RELEASE_TABLET | Freq: Every evening | ORAL | 3 refills | Status: DC
Start: 2019-10-01 — End: 2020-08-18

## 2019-10-01 MED ORDER — CVS VITAMIN B-12 1000 MCG PO TABS
1000.0000 ug | ORAL_TABLET | Freq: Every day | ORAL | 3 refills | Status: DC
Start: 2019-10-01 — End: 2020-01-08

## 2019-10-01 MED ORDER — METFORMIN HCL ER 500 MG PO TB24
500.00 mg | ORAL_TABLET | Freq: Every morning | ORAL | 3 refills | Status: DC
Start: 2019-10-01 — End: 2020-01-08

## 2019-10-01 MED ORDER — VITAMIN D 50 MCG (2000 UT) PO CAPS
1.00 | ORAL_CAPSULE | Freq: Every day | ORAL | 3 refills | Status: DC
Start: 2019-10-01 — End: 2020-01-08

## 2019-10-01 MED ORDER — ATORVASTATIN CALCIUM 40 MG PO TABS
40.0000 mg | ORAL_TABLET | Freq: Every day | ORAL | 3 refills | Status: DC
Start: 2019-10-01 — End: 2020-08-18

## 2019-10-08 ENCOUNTER — Encounter (INDEPENDENT_AMBULATORY_CARE_PROVIDER_SITE_OTHER): Payer: Self-pay | Admitting: Family Medicine

## 2019-10-11 ENCOUNTER — Encounter (INDEPENDENT_AMBULATORY_CARE_PROVIDER_SITE_OTHER): Payer: Self-pay

## 2019-10-14 ENCOUNTER — Encounter (INDEPENDENT_AMBULATORY_CARE_PROVIDER_SITE_OTHER): Payer: Self-pay | Admitting: Family Medicine

## 2019-11-06 ENCOUNTER — Other Ambulatory Visit: Payer: Self-pay | Admitting: Family Nurse Practitioner

## 2019-11-07 ENCOUNTER — Other Ambulatory Visit (HOSPITAL_BASED_OUTPATIENT_CLINIC_OR_DEPARTMENT_OTHER): Payer: Self-pay

## 2019-11-07 DIAGNOSIS — D0512 Intraductal carcinoma in situ of left breast: Secondary | ICD-10-CM

## 2019-11-09 ENCOUNTER — Encounter (INDEPENDENT_AMBULATORY_CARE_PROVIDER_SITE_OTHER): Payer: Self-pay | Admitting: Family Medicine

## 2019-11-11 ENCOUNTER — Encounter (INDEPENDENT_AMBULATORY_CARE_PROVIDER_SITE_OTHER): Payer: Self-pay

## 2019-11-12 ENCOUNTER — Encounter (INDEPENDENT_AMBULATORY_CARE_PROVIDER_SITE_OTHER): Payer: Self-pay

## 2019-11-13 NOTE — Progress Notes (Signed)
[facility] Breast Cancer Follow Up    Treatment Team  Ref Prov:  Primary Care Physician:  Raj Janus, MD Radiology facility:[facility])--[phone] Breast Surgeon :Henderson Baltimore MD  [phone]   Plastic Surgeonnone Radiation Onc:  Miguel Dibble MD: [phone] [facility] [place]); Medical Onc:  Laney Pastor, MD [phone] [facility] [place])      Breast Cancer Comprehensive Care Plan Summary  Date of diagnosis: 11/03/2018 Event : DCIS ECOG Performance:Grade 0 (Fully active) Genetic Testing:not indicated   Presentation :Abnormal mammogram with microcalcifications Side: left Focality: unifocal Location:radian 12:00   Biologic Tumor characteristics  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 - not indicated Oncotype Dx:  DCIS , not indicated   Estrogen Receptor: positive 19 % Progesterone Receptor: negative Her-2-neu Receptor: DCIS, not indicated Androgen Receptor;  not done   Staging  Tumor Size:   DCIS with <29mm microinvasion Nodes: clinically/US negative Systemic Metastases: clinically negative Stage: Stage0 , Tmic N0 M0   Treatment  Modality Date    Surgery   12/25/18    01/15/2019 left, partial mastectomy with needle localization    Re-excision lumpectomy    Radiation  Whole breast radiation completed on 04/02/2019   Endocrine  not felt to be warranted as the patient was only weakly ER positive   Chemotherapy     Biologic     Clinical trial         HPI   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 female patient here for follow up of her history of left breast cancer originally diagnosed in February 2020.  She is currently on No endocrine therapy    The patient is asymptomatic and denies any breast pain , palpable masses, skin or nipple changes or nipple discharge.     She is asking about going back on estrogen replacement due to persistent night sweats.     Recent Imaging:  LDM  07/18/19: No mammographic evidence for malignancy. BIRADS2    BDM 11/06/19: Left lumpectomy changes with No mammographic evidence for malignancy. BIRADS2    The following portions of the patient's history were reviewed and updated as appropriate: allergies, current medications, past family history, past medical history, past social history, past surgical history and problem list.     has a past medical history of Anxiety, Arrhythmia, Chronic kidney disease, Diabetes mellitus, type 2, Gastroesophageal reflux disease, Hemorrhagic diathesis (06/2002), Hyperlipidemia, Hypertension, Malignant neoplasm of breast (2020), PAN (polyarteritis nodosa) (2003), and Sleep apnea.    Family History     Family History   Problem Relation Age of Onset   . Diabetes Mother    . Thyroid disease Mother    . No known problems Father         Review of Systems   ROS:  Constitutional:  night sweats  HEENT:  negative  Cardiovascular:  negative  Respiratory:  negative  Gastrointestinal:  negative  Genitourinary:  negative  Musculoskeletal:  negative  Skin:  negative  Neurologic:  negative  Psychiatric:  negative  Endocrine:  negative  Hematologic:  negative                       Physical Exam   BP 133/81   Pulse 67   Temp 97.2 F (36.2 C) (Temporal)   Ht 1.626 m (5\' 4" )   Wt 70.8 kg (156 lb)   BMI 26.78 kg/m  WDWN female in NAD  HEENT:  Clear, no scleral icterus, neck supple  Neck:  No thyromegaly or masses, trachea midline  Chest:  Respiratory effort normal  BJM:  Gait and station normal  Ext:  No CCE  Skin:  Free of significant ulcers or lesions  Neuro:  Grossly intact, no focal findings, alert and oriented, asks appropriate questions  LN:  No axillary, supraclavicular or cervical adenopathy  Breast:  Symmetric .    Right breast: No skin or nipple changes, no nipple retraction or discharge . No palpable masses    Left breast : No skin or nipple changes, no nipple retraction or discharge . No palpable masses. Well healed incision. Mild  radiation change.    Rads   Radiological imaging and reports reviewed as above.     Assessment/Plan   1. Personal history of breast carcinoma    Patient is doing well. She currently has no evidence of disease .  Her physical and emotional recovery is good. Cosmetic outcome is good .  I have recommended continued treatment with Serial imaging    Surveillance / Folllow up recommendations:    I reviewed the importance of breast exams and follow up at six month intervals for the first five years after the first treatment; alternating exams at 6 month intervals with medical oncology is a good option between years three and five to limit the number of times needed for follow up ; and every year thereafter.  I have recommended long term follow-up because there is a low but measurable risk of breast cancer recurrence even more than 10 years after treatment. In addition, if  there is remaining breast tissue, there is a higher risk to develop a new, independent, breast cancer at some time in the future.    After lumpectomy, we recommend mammograms once a year. The purpose is to monitor for local recurrence and detection of new primary.    Patient is advised to report these symptoms : new lumps, bone pain, chest pain, shortness of breath or difficulty breathing, abdominal pain, or persistent headaches.     The following tests are not recommended for routine breast cancer follow-up: breast MRI, FDG-PET scans, complete blood cell counts, automated chemistry studies, chest x-rays, bone scans, liver ultrasound, and tumor markers (CA 15-3, CA 27.29, CEA).     Nutrition  I have also discussed life-style choices to decrease risk of breast cancer such as:   Healthy eating habits, meals high in fruits, vegetables, nuts and complex carbohydrates, low in animal fats.  Limit the amount of alcohol ingested   Avoid  Hormone Replacement Therapy.    Exercise  Continue to exercise, aerobic 30 - 40 minutes , 3 -4 times / week    Follow  up  Continue healthy lifestyle.   Would recommend avoiding HRT- pt verbalized understanding.   Bilateral mammogram in February 2022  Follow up visit with me  in 6 months, sooner if concerns  Follow up with Medical Oncology and Radiation Oncology.  Patient agrees with plan.

## 2019-11-14 ENCOUNTER — Encounter (INDEPENDENT_AMBULATORY_CARE_PROVIDER_SITE_OTHER): Payer: Self-pay

## 2019-11-20 ENCOUNTER — Ambulatory Visit (INDEPENDENT_AMBULATORY_CARE_PROVIDER_SITE_OTHER): Payer: Medicare Other | Admitting: Family Nurse Practitioner

## 2019-11-20 ENCOUNTER — Encounter (HOSPITAL_BASED_OUTPATIENT_CLINIC_OR_DEPARTMENT_OTHER): Payer: Self-pay | Admitting: Family Nurse Practitioner

## 2019-11-20 VITALS — BP 133/81 | HR 67 | Temp 97.2°F | Ht 64.0 in | Wt 156.0 lb

## 2019-11-20 DIAGNOSIS — Z853 Personal history of malignant neoplasm of breast: Secondary | ICD-10-CM

## 2019-11-25 ENCOUNTER — Encounter (INDEPENDENT_AMBULATORY_CARE_PROVIDER_SITE_OTHER): Payer: Self-pay | Admitting: Family Medicine

## 2019-12-02 ENCOUNTER — Encounter: Payer: Self-pay | Admitting: Gastroenterology

## 2019-12-03 ENCOUNTER — Encounter (INDEPENDENT_AMBULATORY_CARE_PROVIDER_SITE_OTHER): Payer: Self-pay | Admitting: Family Medicine

## 2019-12-03 DIAGNOSIS — H18899 Other specified disorders of cornea, unspecified eye: Secondary | ICD-10-CM

## 2019-12-03 DIAGNOSIS — H43819 Vitreous degeneration, unspecified eye: Secondary | ICD-10-CM

## 2019-12-03 DIAGNOSIS — H35349 Macular cyst, hole, or pseudohole, unspecified eye: Secondary | ICD-10-CM

## 2019-12-03 DIAGNOSIS — H35379 Puckering of macula, unspecified eye: Secondary | ICD-10-CM

## 2019-12-03 DIAGNOSIS — E119 Type 2 diabetes mellitus without complications: Secondary | ICD-10-CM

## 2019-12-07 ENCOUNTER — Other Ambulatory Visit (INDEPENDENT_AMBULATORY_CARE_PROVIDER_SITE_OTHER): Payer: Self-pay | Admitting: Family Medicine

## 2020-01-08 ENCOUNTER — Ambulatory Visit (INDEPENDENT_AMBULATORY_CARE_PROVIDER_SITE_OTHER): Payer: Medicare Other | Admitting: Family Medicine

## 2020-01-08 ENCOUNTER — Encounter (INDEPENDENT_AMBULATORY_CARE_PROVIDER_SITE_OTHER): Payer: Self-pay | Admitting: Family Medicine

## 2020-01-08 VITALS — BP 139/77 | HR 68 | Temp 97.6°F | Ht 63.19 in | Wt 153.0 lb

## 2020-01-08 DIAGNOSIS — E119 Type 2 diabetes mellitus without complications: Secondary | ICD-10-CM

## 2020-01-08 DIAGNOSIS — I10 Essential (primary) hypertension: Secondary | ICD-10-CM

## 2020-01-08 DIAGNOSIS — Z9842 Cataract extraction status, left eye: Secondary | ICD-10-CM | POA: Insufficient documentation

## 2020-01-08 DIAGNOSIS — N1831 Chronic kidney disease, stage 3a: Secondary | ICD-10-CM

## 2020-01-08 DIAGNOSIS — K219 Gastro-esophageal reflux disease without esophagitis: Secondary | ICD-10-CM

## 2020-01-08 DIAGNOSIS — Z0000 Encounter for general adult medical examination without abnormal findings: Secondary | ICD-10-CM

## 2020-01-08 DIAGNOSIS — M797 Fibromyalgia: Secondary | ICD-10-CM

## 2020-01-08 DIAGNOSIS — H524 Presbyopia: Secondary | ICD-10-CM | POA: Insufficient documentation

## 2020-01-08 DIAGNOSIS — H40039 Anatomical narrow angle, unspecified eye: Secondary | ICD-10-CM | POA: Insufficient documentation

## 2020-01-08 DIAGNOSIS — H43819 Vitreous degeneration, unspecified eye: Secondary | ICD-10-CM | POA: Insufficient documentation

## 2020-01-08 DIAGNOSIS — H269 Unspecified cataract: Secondary | ICD-10-CM | POA: Insufficient documentation

## 2020-01-08 MED ORDER — CVS VITAMIN B-12 1000 MCG PO TABS
1000.0000 ug | ORAL_TABLET | Freq: Every day | ORAL | 3 refills | Status: DC
Start: 2020-01-08 — End: 2020-01-14

## 2020-01-08 MED ORDER — VITAMIN D (CHOLECALCIFEROL) 25 MCG (1000 UT) PO TABS
1000.0000 [IU] | ORAL_TABLET | Freq: Every day | ORAL | Status: DC
Start: 2020-01-08 — End: 2022-07-13

## 2020-01-08 MED ORDER — METFORMIN HCL ER 500 MG PO TB24
500.00 mg | ORAL_TABLET | Freq: Every evening | ORAL | 3 refills | Status: DC
Start: 2020-01-08 — End: 2021-02-08

## 2020-01-08 NOTE — Progress Notes (Signed)
Katelyn Caldwell is a 69 y.o. female who presents today for a Medicare Annual Wellness Visit.     Health Risk Assessment     During the past month, how would you rate your general health?:  Very Good  Which of the following tasks can you do without assistance - drive or take the bus alone; shop for groceries or clothes; prepare your own meals; do your own housework/laundry; handle your own finances/pay bills; eat, bathe or get around your home?:  Drive or take the bus alone, Shop for groceries or clothes, Prepare your own meals, Do your own housework/laundry, Handle your own finances/pay bills, Eat, bathe, dress or get around your home  Which of the following problems have you been bothered by in the past month - dizzy when standing up; problems using the phone; feeling tired or fatigued; moderate or severe body pain?: None of these  Do you exercise for about 20 minutes 3 or more days per week?:  Yes  During the past month was someone available to help if you needed and wanted help?  For example, if you felt nervous, lonely, got sick and had to stay in bed, needed someone to talk to, needed help with daily chores or needed help just taking care of yourself.: Yes  Do you always wear a seat belt?: Yes  Do you have any trouble taking medications the way you have been told to take them?: No  Have you been given any information that can help you with keeping track of your medications?: No  Do you have trouble paying for your medications?: No  Have you been given any information that can help you with hazards in your house, such as scatter rugs, furniture, etc?: No  Do you feel unsteady when standing or walking?: No  Do you worry about falling?: No  Have you fallen two or more times in the past year?: No  Did you suffer any injuries from your falls in the past year?: No    Additional Concerns    Patient Care Team:  Raj Janus, MD as PCP - General (Family Medicine)  Miachel Roux, MD as Consulting Physician  (Cardiology)  [place], [place], [place]  Willaim Sheng, MD as Consulting Physician (Interventional Cardiology)    Past Medical History:   Diagnosis Date    Anxiety     no meds    Arrhythmia     PVCs    Chronic kidney disease     Stage 3 CKD - d/t PAN    Diabetes mellitus, type 2     A1C = 7, FBS = 125    Gastroesophageal reflux disease     Hemorrhagic diathesis 06/2002    PAN diagnosed at this time    Hyperlipidemia     Hypertension     130's over 80's per pt    Malignant neoplasm of breast 2020    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 will bring DOS     Past Surgical History:   Procedure Laterality Date    ABDOMINAL SURGERY  1983    C- section    BIOPSY, BREAST, TUMOR EXCISION, ULTRASOUND NEEDLE LOCALIZATION Left 12/25/2018    Procedure: BIOPSY, BREAST, TUMOR EXCISION, ULTRASOUND NEEDLE LOCALIZATION;  Surgeon: Boykin Peek, MD;  Location: [facility] OR;  Service: General;  Laterality: Left;    BLADDER SUSPENSION  cystoscopy    benign  BREAST, LUMPECTOMY Left 12/25/2018    Procedure: BREAST, LUMPECTOMY;  Surgeon: Boykin Peek, MD;  Location: [facility] OR;  Service: General;  Laterality: Left;  LEFT BREAST LUMPECTOMY WITH ULTRASOUND GUIDED WIRE PLACEMENT IN OR BY MD    BREAST, LUMPECTOMY Left 01/15/2019    Procedure: BREAST, LUMPECTOMY;  Surgeon: Boykin Peek, MD;  Location: [facility];  Service: General;  Laterality: Left;  LEFT BREAST LUMPECTOMY ( RE-EXCISION)    CESAREAN SECTION      1983    CYSTOSCOPY, BLADDER BIOPSY      EXCISION BIOPSY WITH NEEDLE LOCALIZATION  2012    left breast, benign    LIFT, ARM (MEDICAL)  2012    Reconstractiv surgery from sky accident.    RADIOFREQUENCY SPECTROSCOPY INTRAOPERATIVE MARGIN ASSESSMENT Left 12/25/2018    Procedure: RADIOFREQUENCY SPECTROSCOPY INTRAOPERATIVE MARGIN ASSESSMENT;  Surgeon: Boykin Peek, MD;  Location: [facility] OR;  Service: General;  Laterality: Left;    RENAL  BIOPSY  2003    SINUS SURGERY  06/2017     Allergies   Allergen Reactions    Ciprofloxacin Other (See Comments)     Severe Connective Tissue (joint) pain and stiffness    Nsaids Other (See Comments)     Pt states this causes her bleeding d/t PAN      Current Outpatient Medications   Medication Sig Dispense Refill    atorvastatin (LIPITOR) 40 MG tablet Take 1 tablet (40 mg total) by mouth daily 90 tablet 3    cyanocobalamin ([employer] VITAMIN B12) 1000 MCG tablet Take 1 tablet (1,000 mcg total) by mouth daily 90 tablet 3    fluticasone (FLONASE) 50 MCG/ACT nasal spray 2 sprays by Nasal route every evening         glucose blood (FREESTYLE LITE) test strip Pt uses freestyle freedom lyte test strips testing BID prn 300 each 3    Lancets (FREESTYLE) lancets Use twice daily as needed 300 each 3    lidocaine (LIDODERM) 5 % Place 1 patch onto affected skin every twelve hours as needed for discomfort.  Remove each patch after 12 hours of use 30 patch 2    losartan (COZAAR) 25 MG tablet Take 1 tablet (25 mg total) by mouth daily 90 tablet 1    Magnesium 400 MG Tab Take 400 mg by mouth daily      metFORMIN (GLUCOPHAGE-XR) 500 MG 24 hr tablet Take 1 tablet (500 mg total) by mouth nightly 90 tablet 3    metoprolol succinate XL (TOPROL-XL) 25 MG 24 hr tablet Take 25 mg by mouth daily         pantoprazole (PROTONIX) 40 MG tablet Take 1 tablet (40 mg total) by mouth every evening 90 tablet 3    Probiotic Product (PROBIOTIC-10 PO) Take by mouth every other day        SITagliptin-MetFORMIN HCl (Janumet XR) 50-500 MG Tablet SR 24 hr Take 1 tablet by mouth 2 (two) times daily with meals 180 tablet 3    vitamin D (CHOLECALCIFEROL) 25 MCG (1000 UT) tablet Take 1 tablet (1,000 Units total) by mouth daily       No current facility-administered medications for this visit.      Social History     Tobacco Use    Smoking status: Never Smoker    Smokeless tobacco: Never Used   Substance Use Topics    Alcohol use: Yes      Alcohol/week: 2.0 standard drinks     Types: 2  Glasses of wine per week     Comment: 1-2 glass of wine a week.    Drug use: No      Family History   Problem Relation Age of Onset    Diabetes Mother     Thyroid disease Mother     No known problems Father         The following sections were reviewed this encounter by the provider:        Hospitalizations  no hospitalizations within past year    Depression Screening    See related Activity or Flowsheet    Functional Ability    Falls Risk:  home does not have throw rugs, poor lighting or a slippery bath tub or shower  Hearing:  hearing within normal limits  Exercise:  eliptical and Moderate ( i.e. brisk walking )  ADL's:   Bathing - independent   Dressing - independent   Mobility - independent   Transfer - independent   Eating - independent}   Toileting - independent   ADL assistance not needed and provided by spouse    Discussion of Advance Directives: Has no Advanced Directive. Form provided.     Assessment    BP 139/77    Pulse 68    Temp 97.6 F (36.4 C) (Temporal)    Ht 1.605 m (5' 3.19")    Wt 69.4 kg (153 lb)    SpO2 99%    BMI 26.94 kg/m        Screening EKG (IPPE only): not indicated        Evaluation of Cognitive Function    Mood/affect: Appropriate  Appearance: neatly groomed, appropriately and adequately nourished  Family member/caregiver input: Not present    AWV Mini-Cog Result:  > 3 points - negative screen for dementia    Assessment/Plan    1. Routine general medical examination at a health care facility  - CBC and differential  - Comprehensive metabolic panel  - Lipid panel  - Urine Microalbumin Random  - TSH, Abn Reflex to Free T4, Serum  - Vitamin B12  - Vitamin D,25 OH, Total  - Magnesium  - Hemoglobin A1C  - Calcium, ionized  - PTH, Intact  - CBC and differential; Future  - Comprehensive metabolic panel; Future  - Lipid panel; Future  - TSH, Abn Reflex to Free T4, Serum; Future  - Hemoglobin A1C; Future  - Urine Microalbumin Random; Future  -  Vitamin D,25 OH, Total; Future  - Magnesium; Future  - Calcium, ionized; Future  - PTH, Intact; Future  - Vitamin B12; Future    2. Fibromyalgia    3. Type 2 diabetes mellitus without complication, without long-term current use of insulin  - Urine Microalbumin Random  - Hemoglobin A1C  - Hemoglobin A1C; Future  - Urine Microalbumin Random; Future    4. Essential hypertension    5. Gastroesophageal reflux disease without esophagitis    6. Stage 3a chronic kidney disease  - Vitamin D,25 OH, Total  - Calcium, ionized  - PTH, Intact    7. Hypercalcemia  - Vitamin D,25 OH, Total  - Calcium, ionized  - PTH, Intact  - Vitamin D,25 OH, Total; Future  - Magnesium; Future  - Calcium, ionized; Future  - PTH, Intact; Future      High BMI Follow Up   Encouragement to Exercise    Raj Janus, MD  01/08/2020

## 2020-01-09 ENCOUNTER — Encounter (INDEPENDENT_AMBULATORY_CARE_PROVIDER_SITE_OTHER): Payer: Self-pay

## 2020-01-09 ENCOUNTER — Encounter (INDEPENDENT_AMBULATORY_CARE_PROVIDER_SITE_OTHER): Payer: Self-pay | Admitting: Family Medicine

## 2020-01-10 ENCOUNTER — Other Ambulatory Visit (FREE_STANDING_LABORATORY_FACILITY): Payer: Medicare Other

## 2020-01-10 ENCOUNTER — Encounter (INDEPENDENT_AMBULATORY_CARE_PROVIDER_SITE_OTHER): Payer: Self-pay | Admitting: Family Medicine

## 2020-01-10 DIAGNOSIS — Z136 Encounter for screening for cardiovascular disorders: Secondary | ICD-10-CM

## 2020-01-10 DIAGNOSIS — Z0000 Encounter for general adult medical examination without abnormal findings: Secondary | ICD-10-CM

## 2020-01-10 DIAGNOSIS — E119 Type 2 diabetes mellitus without complications: Secondary | ICD-10-CM

## 2020-01-10 LAB — CBC AND DIFFERENTIAL
Absolute NRBC: 0 10*3/uL (ref 0.00–0.00)
Basophils Absolute Automated: 0.02 10*3/uL (ref 0.00–0.08)
Basophils Automated: 0.5 %
Eosinophils Absolute Automated: 0.05 10*3/uL (ref 0.00–0.44)
Eosinophils Automated: 1.3 %
Hematocrit: 39.6 % (ref 34.7–43.7)
Hgb: 12.3 g/dL (ref 11.4–14.8)
Immature Granulocytes Absolute: 0.01 10*3/uL (ref 0.00–0.07)
Immature Granulocytes: 0.3 %
Lymphocytes Absolute Automated: 0.82 10*3/uL (ref 0.42–3.22)
Lymphocytes Automated: 20.9 %
MCH: 27.5 pg (ref 25.1–33.5)
MCHC: 31.1 g/dL — ABNORMAL LOW (ref 31.5–35.8)
MCV: 88.6 fL (ref 78.0–96.0)
MPV: 11.8 fL (ref 8.9–12.5)
Monocytes Absolute Automated: 0.34 10*3/uL (ref 0.21–0.85)
Monocytes: 8.7 %
Neutrophils Absolute: 2.68 10*3/uL (ref 1.10–6.33)
Neutrophils: 68.3 %
Nucleated RBC: 0 /100 WBC (ref 0.0–0.0)
Platelets: 143 10*3/uL (ref 142–346)
RBC: 4.47 10*6/uL (ref 3.90–5.10)
RDW: 13 % (ref 11–15)
WBC: 3.92 10*3/uL (ref 3.10–9.50)

## 2020-01-10 LAB — HEMOGLOBIN A1C
Average Estimated Glucose: 151.3 mg/dL
Hemoglobin A1C: 6.9 % — ABNORMAL HIGH (ref 4.6–5.9)

## 2020-01-10 LAB — GFR: EGFR: 44.5

## 2020-01-10 LAB — MAGNESIUM: Magnesium: 1.8 mg/dL (ref 1.6–2.6)

## 2020-01-10 LAB — COMPREHENSIVE METABOLIC PANEL
ALT: 11 U/L (ref 0–55)
AST (SGOT): 20 U/L (ref 5–34)
Albumin/Globulin Ratio: 1.6 (ref 0.9–2.2)
Albumin: 4.2 g/dL (ref 3.5–5.0)
Alkaline Phosphatase: 81 U/L (ref 37–106)
Anion Gap: 11 (ref 5.0–15.0)
BUN: 25 mg/dL — ABNORMAL HIGH (ref 7.0–19.0)
Bilirubin, Total: 0.6 mg/dL (ref 0.2–1.2)
CO2: 25 mEq/L (ref 21–29)
Calcium: 9.6 mg/dL (ref 8.5–10.5)
Chloride: 102 mEq/L (ref 100–111)
Creatinine: 1.2 mg/dL (ref 0.4–1.5)
Globulin: 2.6 g/dL (ref 2.0–3.7)
Glucose: 136 mg/dL — ABNORMAL HIGH (ref 70–100)
Potassium: 5.2 mEq/L — ABNORMAL HIGH (ref 3.5–5.1)
Protein, Total: 6.8 g/dL (ref 6.0–8.3)
Sodium: 138 mEq/L (ref 136–145)

## 2020-01-10 LAB — VITAMIN D,25 OH,TOTAL: Vitamin D, 25 OH, Total: 42 ng/mL (ref 30–100)

## 2020-01-10 LAB — THYROID STIMULATING HORMONE (TSH), REFLEX ON ABNORMAL TO FREE T4, SERUM: TSH, Abn Reflex to Free T4, Serum: 1.68 u[IU]/mL (ref 0.35–4.94)

## 2020-01-10 LAB — MICROALBUMIN, RANDOM URINE
Urine Creatinine, Random: 56 mg/dL
Urine Microalbumin, Random: 330 — ABNORMAL HIGH (ref 0.0–30.0)
Urine Microalbumin/Creatinine Ratio: 589 ug/mg — ABNORMAL HIGH (ref 0–30)

## 2020-01-10 LAB — HEMOLYSIS INDEX: Hemolysis Index: 7 (ref 0–18)

## 2020-01-10 LAB — PTH, INTACT: PTH Intact: 63.8 pg/mL (ref 9.0–72.0)

## 2020-01-10 LAB — LIPID PANEL
Cholesterol / HDL Ratio: 3.3
Cholesterol: 163 mg/dL (ref 0–199)
HDL: 50 mg/dL (ref 40–9999)
LDL Calculated: 94 mg/dL (ref 0–99)
Triglycerides: 97 mg/dL (ref 34–149)
VLDL Calculated: 19 mg/dL (ref 10–40)

## 2020-01-10 LAB — VITAMIN B12: Vitamin B-12: 950 pg/mL — ABNORMAL HIGH (ref 211–911)

## 2020-01-10 LAB — CALCIUM, IONIZED: Calcium, Ionized: 2.56 mEq/L (ref 2.30–2.58)

## 2020-01-13 NOTE — Progress Notes (Signed)
Please call this patient to notify them that her potassium was a little high at last check and she was a bit dehydrated.  Ensure good hydration and avoid potassium rich foods and recheck in 1 month.

## 2020-01-14 ENCOUNTER — Ambulatory Visit (INDEPENDENT_AMBULATORY_CARE_PROVIDER_SITE_OTHER): Payer: Medicare Other | Admitting: Gastroenterology

## 2020-01-14 ENCOUNTER — Other Ambulatory Visit (INDEPENDENT_AMBULATORY_CARE_PROVIDER_SITE_OTHER): Payer: Medicare Other

## 2020-01-14 ENCOUNTER — Encounter: Payer: Self-pay | Admitting: Gastroenterology

## 2020-01-14 VITALS — BP 130/64 | HR 81 | Temp 98.3°F | Ht 61.5 in | Wt 143.0 lb

## 2020-01-14 DIAGNOSIS — K219 Gastro-esophageal reflux disease without esophagitis: Secondary | ICD-10-CM

## 2020-01-14 DIAGNOSIS — R1310 Dysphagia, unspecified: Secondary | ICD-10-CM | POA: Diagnosis not present

## 2020-01-14 DIAGNOSIS — R933 Abnormal findings on diagnostic imaging of other parts of digestive tract: Secondary | ICD-10-CM

## 2020-01-14 DIAGNOSIS — D649 Anemia, unspecified: Secondary | ICD-10-CM

## 2020-01-14 LAB — CBC WITH DIFFERENTIAL/PLATELET
Basophils Absolute: 0.1 10*3/uL (ref 0.0–0.1)
Basophils Relative: 0.7 % (ref 0.0–3.0)
Eosinophils Absolute: 0.2 10*3/uL (ref 0.0–0.7)
Eosinophils Relative: 3 % (ref 0.0–5.0)
HCT: 35.4 % — ABNORMAL LOW (ref 36.0–46.0)
Hemoglobin: 12 g/dL (ref 12.0–15.0)
Lymphocytes Relative: 14.5 % (ref 12.0–46.0)
Lymphs Abs: 1.1 10*3/uL (ref 0.7–4.0)
MCHC: 34 g/dL (ref 30.0–36.0)
MCV: 80.8 fl (ref 78.0–100.0)
Monocytes Absolute: 0.5 10*3/uL (ref 0.1–1.0)
Monocytes Relative: 6.5 % (ref 3.0–12.0)
Neutro Abs: 5.8 10*3/uL (ref 1.4–7.7)
Neutrophils Relative %: 75.3 % (ref 43.0–77.0)
Platelets: 252 10*3/uL (ref 150.0–400.0)
RBC: 4.38 Mil/uL (ref 3.87–5.11)
RDW: 15.6 % — ABNORMAL HIGH (ref 11.5–15.5)
WBC: 7.8 10*3/uL (ref 4.0–10.5)

## 2020-01-14 LAB — COMPREHENSIVE METABOLIC PANEL
ALT: 27 U/L (ref 0–35)
AST: 24 U/L (ref 0–37)
Albumin: 4.7 g/dL (ref 3.5–5.2)
Alkaline Phosphatase: 122 U/L — ABNORMAL HIGH (ref 39–117)
BUN: 10 mg/dL (ref 6–23)
CO2: 26 mEq/L (ref 19–32)
Calcium: 9.2 mg/dL (ref 8.4–10.5)
Chloride: 95 mEq/L — ABNORMAL LOW (ref 96–112)
Creatinine, Ser: 0.94 mg/dL (ref 0.40–1.20)
GFR: 58.99 mL/min — ABNORMAL LOW (ref >60.00)
Glucose, Bld: 98 mg/dL (ref 70–99)
Potassium: 4.3 mEq/L (ref 3.5–5.1)
Sodium: 129 mEq/L — ABNORMAL LOW (ref 135–145)
Total Bilirubin: 0.4 mg/dL (ref 0.2–1.2)
Total Protein: 7.6 g/dL (ref 6.0–8.3)

## 2020-01-14 LAB — FOLATE: Folate: >23.7 ng/mL (ref >5.9)

## 2020-01-14 LAB — IBC + FERRITIN
Ferritin: 62.7 ng/mL (ref 10.0–291.0)
Iron: 83 ug/dL (ref 42–145)
Saturation Ratios: 23.9 % (ref 20.0–50.0)
Transferrin: 248 mg/dL (ref 212.0–360.0)

## 2020-01-14 MED ORDER — ESOMEPRAZOLE MAGNESIUM 40 MG PO CPDR: 40.0000 mg | DELAYED_RELEASE_CAPSULE | Freq: Two times a day (BID) | ORAL | 11 refills | Status: AC

## 2020-01-14 MED ORDER — CVS VITAMIN B-12 1000 MCG PO TABS
500.0000 ug | ORAL_TABLET | Freq: Every day | ORAL | 3 refills | Status: DC
Start: 2020-01-14 — End: 2022-07-13

## 2020-01-14 NOTE — Progress Notes (Signed)
    History of Present Illness: This is a 69 year old female returning for GERD, chronic cough, dysphagia, and an abnormal CT of the esophagus.  She is accompanied by her husband.  She was evaluated in December 2018.  Please refer to that note.  Patient notes problems with a chronic cough that has improved.  She is she has been evaluated and treated by Dr. Cherie Ouch, pulmonologist in [place], with improvement in her cough.  She has had intermittent difficulties with solid food dysphagia.  She notes worsening problems with her reflux.  Barium esophagram performed on September 12, 2019 showed a small sliding hiatal hernia and reflux demonstrated on stress maneuvers.  A 13 mm barium tablet passed without difficulty.  CT scan of the chest on August 12, 2019 showed no acute findings.  Small hiatal hernia with mild thickening of the distal esophagus was noted.  Current Medications, Allergies, Past Medical History, Past Surgical History, Family History and Social History were reviewed in [employer] record.  Physical Exam: General: Well developed, well nourished, no acute distress Head: Normocephalic and atraumatic Eyes:  sclerae anicteric, EOMI Ears: Normal auditory acuity Mouth: Not examined, mask on during Covid-19 pandemic Lungs: Clear throughout to auscultation Heart: Regular rate and rhythm; no murmurs, rubs or bruits Abdomen: Soft, non tender and non distended. No masses, hepatosplenomegaly or hernias noted. Normal Bowel sounds Rectal: Not done Musculoskeletal: Symmetrical with no gross deformities  Pulses:  Normal pulses noted Extremities: No clubbing, cyanosis, edema or deformities noted Neurological: Alert oriented x 4, grossly nonfocal Psychological:  Alert and cooperative. Normal mood and affect   Assessment and Recommendations:  1.  GERD, chronic cough, dysphagia, abnormal CT of eosphagus.  Suspected GERD with LPR contributing to chronic cough however it  may not be the sole cause.  No stricture or hold-up of barium tablet noted on barium esophagram.  Distal esophageal wall thickening likely related to GERD.  Rule out esophagitis, stricture, motility disturbance.  Intensify antireflux measures.  Increase Nexium to 40 mg p.o. twice daily taken 30 minutes before breakfast and dinner.  Schedule EGD with possible dilation. The risks (including bleeding, perforation, infection, missed lesions, medication reactions and possible hospitalization or surgery if complications occur), benefits, and alternatives to endoscopy with possible biopsy and possible dilation were discussed with the patient and they consent to proceed.   2.  Anemia. Hgb=11.7. B12 normal.  Check iron iron-binding ferritin folate.  3.  CRC screening, average risk.  Screening colonoscopy at 10 years is due in October 2022.

## 2020-01-14 NOTE — Patient Instructions (Signed)
Your provider has requested that you go to the basement level for lab work before leaving today. Press "B" on the elevator. The lab is located at the first door on the left as you exit the elevator.  Increase your Nexium to 40 mg twice daily. A new prescription has been sent to your pharmacy.   You have been scheduled for an endoscopy. Please follow written instructions given to you at your visit today. If you use inhalers (even only as needed), please bring them with you on the day of your procedure.  Due to recent changes in healthcare laws, you may see the results of your imaging and laboratory studies on MyChart before your provider has had a chance to review them.  We understand that in some cases there may be results that are confusing or concerning to you. Not all laboratory results come back in the same time frame and the provider may be waiting for multiple results in order to interpret others.  Please give [place] 48 hours in order for your provider to thoroughly review all the results before contacting the office for clarification of your results.   Thank you for choosing me and [facility].  Venita Lick. Pleas Koch., MD., Clementeen Graham

## 2020-01-16 ENCOUNTER — Other Ambulatory Visit: Payer: Self-pay

## 2020-01-16 DIAGNOSIS — R7989 Other specified abnormal findings of blood chemistry: Secondary | ICD-10-CM

## 2020-01-17 ENCOUNTER — Encounter (INDEPENDENT_AMBULATORY_CARE_PROVIDER_SITE_OTHER): Payer: Self-pay

## 2020-01-21 ENCOUNTER — Other Ambulatory Visit: Payer: Self-pay

## 2020-01-21 ENCOUNTER — Ambulatory Visit (AMBULATORY_SURGERY_CENTER): Payer: Medicare Other | Admitting: Gastroenterology

## 2020-01-21 ENCOUNTER — Encounter: Payer: Self-pay | Admitting: Gastroenterology

## 2020-01-21 VITALS — BP 124/72 | HR 81 | Temp 97.7°F | Resp 11 | Ht 61.5 in | Wt 143.0 lb

## 2020-01-21 DIAGNOSIS — K449 Diaphragmatic hernia without obstruction or gangrene: Secondary | ICD-10-CM | POA: Diagnosis not present

## 2020-01-21 DIAGNOSIS — R933 Abnormal findings on diagnostic imaging of other parts of digestive tract: Secondary | ICD-10-CM

## 2020-01-21 DIAGNOSIS — K3189 Other diseases of stomach and duodenum: Secondary | ICD-10-CM

## 2020-01-21 DIAGNOSIS — K2950 Unspecified chronic gastritis without bleeding: Secondary | ICD-10-CM | POA: Diagnosis not present

## 2020-01-21 DIAGNOSIS — R1310 Dysphagia, unspecified: Secondary | ICD-10-CM

## 2020-01-21 DIAGNOSIS — K2970 Gastritis, unspecified, without bleeding: Secondary | ICD-10-CM

## 2020-01-21 DIAGNOSIS — K219 Gastro-esophageal reflux disease without esophagitis: Secondary | ICD-10-CM

## 2020-01-21 MED ORDER — SODIUM CHLORIDE 0.9 % IV SOLN: 500.0000 mL | Freq: Once | INTRAVENOUS | Status: DC

## 2020-01-21 NOTE — Progress Notes (Signed)
JB- temp CW- vitals 

## 2020-01-21 NOTE — Patient Instructions (Signed)
YOU HAD AN ENDOSCOPIC PROCEDURE TODAY AT THE [facility]:   Refer to the procedure report that was given to you for any specific questions about what was found during the examination.  If the procedure report does not answer your questions, please call your gastroenterologist to clarify.  If you requested that your care partner not be given the details of your procedure findings, then the procedure report has been included in a sealed envelope for you to review at your convenience later.  YOU SHOULD EXPECT: Some feelings of bloating in the abdomen. Passage of more gas than usual.  Walking can help get rid of the air that was put into your GI tract during the procedure and reduce the bloating. If you had a lower endoscopy (such as a colonoscopy or flexible sigmoidoscopy) you may notice spotting of blood in your stool or on the toilet paper. If you underwent a bowel prep for your procedure, you may not have a normal bowel movement for a few days.  Please Note:  You might notice some irritation and congestion in your nose or some drainage.  This is from the oxygen used during your procedure.  There is no need for concern and it should clear up in a day or so.  SYMPTOMS TO REPORT IMMEDIATELY:    Following upper endoscopy (EGD)  Vomiting of blood or coffee ground material  New chest pain or pain under the shoulder blades  Painful or persistently difficult swallowing  New shortness of breath  Fever of 100F or higher  Black, tarry-looking stools  For urgent or emergent issues, a gastroenterologist can be reached at any hour by calling [phone]. Do not use MyChart messaging for urgent concerns.    DIET:  Follow a post-dilation diet: Nothing by mouth until 3:30pm, then Clear Liquids only from 3:30pm to 5:30pm, then a soft diet beginning at 5:30pm for the rest of the day today, but then you may proceed to your regular diet as tolerated tomorrow am.  Drink plenty of fluids but you  should avoid alcoholic beverages for 24 hours.  MEDICATIONS: Continue present medications including Nexium 40 mg by mouth twice daily before meals.  Follow up with your primary care physician and Dr. Russella Dar for GI as needed.  Please see handouts given to you by your recovery nurse.  ACTIVITY:  You should plan to take it easy for the rest of today and you should NOT DRIVE or use heavy machinery until tomorrow (because of the sedation medicines used during the test).    FOLLOW UP: Our staff will call the number listed on your records 48-72 hours following your procedure to check on you and address any questions or concerns that you may have regarding the information given to you following your procedure. If we do not reach you, we will leave a message.  We will attempt to reach you two times.  During this call, we will ask if you have developed any symptoms of COVID 19. If you develop any symptoms (ie: fever, flu-like symptoms, shortness of breath, cough etc.) before then, please call [phone].  If you test positive for Covid 19 in the 2 weeks post procedure, please call and report this information to [place].    If any biopsies were taken you will be contacted by phone or by letter within the next 1-3 weeks.  Please call us at [phone] if you have not heard about the biopsies in 3 weeks.   Thank you for  allowing [place] to provide for your healthcare needs today.    SIGNATURES/CONFIDENTIALITY: You and/or your care partner have signed paperwork which will be entered into your electronic medical record.  These signatures attest to the fact that that the information above on your After Visit Summary has been reviewed and is understood.  Full responsibility of the confidentiality of this discharge information lies with you and/or your care-partner.

## 2020-01-21 NOTE — Op Note (Signed)
[facility] Patient Name: Casey Weber Procedure Date: 01/21/2020 2:00 PM MRN: [id number] Endoscopist: Ladene Artist , MD Age: 69 Referring MD:  Date of Birth: Apr 27, 1951 Gender: Female Account #: [id number] Procedure:                Upper GI endoscopy Indications:              Dysphagia, Gastroesophageal reflux disease,                            Abnormal CT of the GI tract Medicines:                Monitored Anesthesia Care Procedure:                Pre-Anesthesia Assessment:                           - Prior to the procedure, a History and Physical                            was performed, and patient medications and                            allergies were reviewed. The patient's tolerance of                            previous anesthesia was also reviewed. The risks                            and benefits of the procedure and the sedation                            options and risks were discussed with the patient.                            All questions were answered, and informed consent                            was obtained. Prior Anticoagulants: The patient has                            taken no previous anticoagulant or antiplatelet                            agents. ASA Grade Assessment: II - A patient with                            mild systemic disease. After reviewing the risks                            and benefits, the patient was deemed in                            satisfactory condition to undergo the procedure.  After obtaining informed consent, the endoscope was                            passed under direct vision. Throughout the                            procedure, the patient's blood pressure, pulse, and                            oxygen saturations were monitored continuously. The                            Endoscope was introduced through the mouth, and                            advanced to the second part of  duodenum. The upper                            GI endoscopy was accomplished without difficulty.                            The patient tolerated the procedure well. Scope In: Scope Out: Findings:                 No endoscopic abnormality was evident in the                            esophagus to explain the patient's complaint of                            dysphagia. It was decided, however, to proceed with                            dilation of the entire esophagus. A guidewire was                            placed and the scope was withdrawn. Dilation was                            performed with a Savary dilator with no resistance                            at 15 mm. No heme.                           Diffuse mild inflammation characterized by erythema                            and granularity was found in the gastric antrum.                            Biopsies were taken with a cold forceps for  histology.                           A small hiatal hernia was present.                           The exam of the stomach was otherwise normal.                           The duodenal bulb and second portion of the                            duodenum were normal. Complications:            No immediate complications. Estimated Blood Loss:     Estimated blood loss was minimal. Impression:               - No endoscopic esophageal abnormality to explain                            patient's dysphagia. Esophagus dilated. Dilated.                           - Gastritis. Biopsied.                           - Small hiatal hernia.                           - Normal duodenal bulb and second portion of the                            duodenum. Recommendation:           - Patient has a contact number available for                            emergencies. The signs and symptoms of potential                            delayed complications were discussed with the                             patient. Return to normal activities tomorrow.                            Written discharge instructions were provided to the                            patient.                           - Clear liquid diet for 2 hours, then advance as                            tolerated to soft diet today.                           -  Resume prior diet tomorrow.                           - Antireflux measures long term.                           - Continue present medications including Nexium 40                            mg po bid ac.                           - Follow up with PCP. GI follow prn.                           - Await pathology results. Meryl Dare, MD 01/21/2020 2:27:01 PM This report has been signed electronically.

## 2020-01-21 NOTE — Progress Notes (Signed)
Called to room to assist during endoscopic procedure.  Patient ID and intended procedure confirmed with present staff. Received instructions for my participation in the procedure from the performing physician.  

## 2020-01-21 NOTE — Progress Notes (Signed)
PT taken to PACU. Monitors in place. VSS. Report given to RN. 

## 2020-01-23 ENCOUNTER — Telehealth: Payer: Self-pay

## 2020-01-23 NOTE — Telephone Encounter (Signed)
LVM

## 2020-01-23 NOTE — Telephone Encounter (Signed)
  Follow up Call-  Call back number 01/21/2020  Post procedure Call Back phone  # [phone]  Permission to leave phone message Yes  Some recent data might be hidden     Patient questions:  Do you have a fever, pain , or abdominal swelling? No. Pain Score  0 *  Have you tolerated food without any problems? Yes.    Have you been able to return to your normal activities? Yes.    Do you have any questions about your discharge instructions: Diet   No. Medications  No. Follow up visit  No.  Do you have questions or concerns about your Care? No.  Actions: * If pain score is 4 or above: No action needed, pain <4.  1. Have you developed a fever since your procedure? no  2.   Have you had an respiratory symptoms (SOB or cough) since your procedure? no  3.   Have you tested positive for COVID 19 since your procedure no  4.   Have you had any family members/close contacts diagnosed with the COVID 19 since your procedure?  no   If yes to any of these questions please route to Laverna Peace, RN and Charlett Lango, RN

## 2020-01-30 ENCOUNTER — Telehealth: Payer: Self-pay | Admitting: Gastroenterology

## 2020-01-30 NOTE — Telephone Encounter (Signed)
Patient notified that path is back, but not reviewed by Dr. Russella Dar.  I relayed no acute problems.  She should expect a letter or can call back for additional questions

## 2020-02-05 ENCOUNTER — Encounter: Payer: Self-pay | Admitting: Gastroenterology

## 2020-02-08 ENCOUNTER — Encounter (INDEPENDENT_AMBULATORY_CARE_PROVIDER_SITE_OTHER): Payer: Self-pay

## 2020-02-21 ENCOUNTER — Telehealth: Payer: Self-pay | Admitting: Gastroenterology

## 2020-02-21 NOTE — Telephone Encounter (Signed)
Patient's husband called stated she was advised to get labs done and they are requesting we put in the order so they can come in sometime after 03/07/20.

## 2020-02-21 NOTE — Telephone Encounter (Signed)
Orders are in

## 2020-03-10 ENCOUNTER — Encounter (INDEPENDENT_AMBULATORY_CARE_PROVIDER_SITE_OTHER): Payer: Self-pay

## 2020-04-09 ENCOUNTER — Ambulatory Visit (INDEPENDENT_AMBULATORY_CARE_PROVIDER_SITE_OTHER): Payer: Medicare Other | Admitting: Nurse Practitioner

## 2020-04-09 ENCOUNTER — Encounter (INDEPENDENT_AMBULATORY_CARE_PROVIDER_SITE_OTHER): Payer: Self-pay | Admitting: Nurse Practitioner

## 2020-04-09 ENCOUNTER — Encounter (INDEPENDENT_AMBULATORY_CARE_PROVIDER_SITE_OTHER): Payer: Self-pay

## 2020-04-09 VITALS — BP 138/78 | HR 70 | Temp 97.0°F | Resp 18 | Ht 63.0 in | Wt 151.0 lb

## 2020-04-09 DIAGNOSIS — J029 Acute pharyngitis, unspecified: Secondary | ICD-10-CM

## 2020-04-09 DIAGNOSIS — J0110 Acute frontal sinusitis, unspecified: Secondary | ICD-10-CM

## 2020-04-09 LAB — POCT RAPID STREP A: Rapid Strep A Screen POCT: NEGATIVE

## 2020-04-09 MED ORDER — AMOXICILLIN-POT CLAVULANATE 875-125 MG PO TABS
1.00 | ORAL_TABLET | Freq: Two times a day (BID) | ORAL | 0 refills | Status: AC
Start: 2020-04-09 — End: 2020-04-19

## 2020-04-09 NOTE — Progress Notes (Signed)
[facility]  PROGRESS NOTE     Patient: Katelyn Caldwell   Date: 04/09/2020   MRN: [id number]       Carol Theys is a 69 y.o. female      HISTORY     History obtained from: Patient    Chief Complaint   Patient presents with    Headache     headache, tooth pain, neck pain, sore throat and postnasal drip for 1 wk. Pt fully vaccinated w/ Pfizer COVID vaccine on 04.01.21        HPI   69 year old female presents for concern of sinus infection. Sx started approximately 10 days ago and have progressively worsened. Sx include h/a, tooth ache, neck pain, sore throat, post nasal drip, sinus congestion right side.   Review of Systems   Constitutional: Positive for fatigue.   HENT: Positive for congestion, postnasal drip, rhinorrhea, sinus pressure and sore throat. Negative for facial swelling, trouble swallowing and voice change.    Respiratory: Negative for cough, shortness of breath and wheezing.    Cardiovascular: Negative for chest pain.   Gastrointestinal: Negative for nausea and vomiting.   Musculoskeletal: Negative for back pain.   Skin: Negative.    Neurological: Positive for headaches. Negative for dizziness and weakness.   Psychiatric/Behavioral: The patient is not nervous/anxious.        History:    Pertinent Past Medical, Surgical, Family and Social History were reviewed.        Current Outpatient Medications:     atorvastatin (LIPITOR) 40 MG tablet, Take 1 tablet (40 mg total) by mouth daily, Disp: 90 tablet, Rfl: 3    cyanocobalamin ([employer] VITAMIN B12) 1000 MCG tablet, Take 0.5 tablets (500 mcg total) by mouth daily, Disp: 45 tablet, Rfl: 3    fluticasone (FLONASE) 50 MCG/ACT nasal spray, 2 sprays by Nasal route every evening  , Disp: , Rfl:     glucose blood (FREESTYLE LITE) test strip, Pt uses freestyle freedom lyte test strips testing BID prn, Disp: 300 each, Rfl: 3    Lancets (FREESTYLE) lancets, Use twice daily as needed, Disp: 300 each, Rfl: 3    lidocaine (LIDODERM) 5 %, Place 1 patch onto  affected skin every twelve hours as needed for discomfort.  Remove each patch after 12 hours of use, Disp: 30 patch, Rfl: 2    losartan (COZAAR) 25 MG tablet, Take 1 tablet (25 mg total) by mouth daily, Disp: 90 tablet, Rfl: 1    Magnesium 400 MG Tab, Take 400 mg by mouth daily, Disp: , Rfl:     metFORMIN (GLUCOPHAGE-XR) 500 MG 24 hr tablet, Take 1 tablet (500 mg total) by mouth nightly, Disp: 90 tablet, Rfl: 3    metoprolol succinate XL (TOPROL-XL) 25 MG 24 hr tablet, Take 25 mg by mouth daily  , Disp: , Rfl:     pantoprazole (PROTONIX) 40 MG tablet, Take 1 tablet (40 mg total) by mouth every evening, Disp: 90 tablet, Rfl: 3    Probiotic Product (PROBIOTIC-10 PO), Take by mouth every other day , Disp: , Rfl:     SITagliptin-MetFORMIN HCl (Janumet XR) 50-500 MG Tablet SR 24 hr, Take 1 tablet by mouth 2 (two) times daily with meals, Disp: 180 tablet, Rfl: 3    vitamin D (CHOLECALCIFEROL) 25 MCG (1000 UT) tablet, Take 1 tablet (1,000 Units total) by mouth daily, Disp:  , Rfl:     amoxicillin-clavulanate (AUGMENTIN) 875-125 MG per tablet, Take 1 tablet by  mouth 2 (two) times daily for 10 days, Disp: 20 tablet, Rfl: 0    Allergies   Allergen Reactions    Ciprofloxacin Other (See Comments)     Severe Connective Tissue (joint) pain and stiffness    Nsaids Other (See Comments)     Pt states this causes her bleeding d/t PAN       Medications and Allergies reviewed.    PHYSICAL EXAM     Vitals:    04/09/20 1558   BP: 138/78   Pulse: 70   Resp: 18   Temp: 97 F (36.1 C)   SpO2: 98%   Weight: 68.5 kg (151 lb)   Height: 1.6 m (5\' 3" )       Physical Exam  Constitutional:       General: She is not in acute distress.     Appearance: Normal appearance. She is ill-appearing. She is not toxic-appearing or diaphoretic.   HENT:      Head: Normocephalic and atraumatic.      Comments: Right frontal sinus tender to palpation     Right Ear: A middle ear effusion is present.      Nose: Mucosal edema and rhinorrhea present.       Mouth/Throat:      Pharynx: Uvula midline. Posterior oropharyngeal erythema present. No pharyngeal swelling, oropharyngeal exudate or uvula swelling.     Eyes: Conjunctivae are normal. Pupils are equal, round, and reactive to light. Right eye exhibits no discharge. Left eye exhibits no discharge. Cardiovascular:      Pulses: Normal pulses.      Heart sounds: Normal heart sounds. No murmur heard.     Pulmonary:      Effort: Pulmonary effort is normal. No respiratory distress.      Breath sounds: Normal breath sounds. No stridor. No wheezing, rhonchi or rales.   Abdominal:      Tenderness: There is no abdominal tenderness.   Musculoskeletal:      Cervical back: Normal range of motion and neck supple. No rigidity.   Neurological:      General: No focal deficit present.      Mental Status: She is alert and oriented to person, place, and time.   Skin:     General: Skin is warm and dry.   Psychiatric:         Mood and Affect: Mood normal.         UCC COURSE     LABS  The following POCT tests were ordered, reviewed and discussed with the patient/family.     Results     Procedure Component Value Units Date/Time    Rapid Group A Strep [[id number]]  (Normal) Collected: 04/09/20 1614    Specimen: Throat Updated: 04/09/20 1614     POCT QC Pass     Rapid Strep A Screen POCT Negative      Comment Negative Results should be confirmed by throat Cx to confirm absence of Strep A inf.          X-Ray  The following X-ray studies were ordered, visualized and independently interpreted by me. Results were discussed with the patient/family.     No results found.    No current facility-administered medications for this visit.       PROCEDURES     Procedures    MEDICAL DECISION MAKING     History, physical, labs/studies most consistent with sinusitis as the diagnosis.    Chart Review:  Prior PCP, Specialist and/or ED notes reviewed  today: Yes  Prior labs/images/studies reviewed today: Yes    Differential Diagnosis: sinusitis, allergic  rhinitis, strep    ASSESSMENT     Encounter Diagnoses   Name Primary?    Sore throat Yes    Acute frontal sinusitis, recurrence not specified             PLAN      PLAN:    Augmentin BID x 10 days   Saline nasal sprays   Increase fluid intake   Follow up with PCP or RTC in 3 days if no improvement - sooner for worsening   Go to ED for severe/alarm symptoms   All questions answered      Orders Placed This Encounter   Procedures    Rapid Group A Strep     Requested Prescriptions     Signed Prescriptions Disp Refills    amoxicillin-clavulanate (AUGMENTIN) 875-125 MG per tablet 20 tablet 0     Sig: Take 1 tablet by mouth 2 (two) times daily for 10 days       Discussed results and diagnosis with patient/family.  Reviewed warning signs for worsening condition, as well as, indications for follow-up with primary care physician and return to urgent care clinic.   Patient/family expressed understanding of instructions.     An After Visit Summary was provided to the patient.

## 2020-04-09 NOTE — Patient Instructions (Signed)
Understanding Acute Rhinosinusitis  Acute rhinosinusitis iswhen the lining of the inside of the nose and the sinuses becomes irritated and swollen. It is also called sinusitis, or a sinus infection.  Sinuses are air-filled spaces in the skull behind the face. They are kept moist and clean by a lining of mucosa. Things such as pollen, smoke, and chemical fumes can irritate the mucosa. It can then swell up. As a response to irritation, the mucosa makes more mucus and other fluids. Tiny hairlike cilia cover the mucosa. Cilia help carry mucus toward the opening of the sinus. Too much mucus may cause the cilia to stop working. This blocks the sinus opening. A buildup of fluid in the sinuses then causes pain and pressure. It can also cause bacteria to grow in the sinuses.    What causes acute rhinosinusitis?  A sinus infection is most often caused by a virus. You are more likely to get one after having a cold or the flu. In some cases, a sinus infection can be caused by bacteria.  You are at higher risk for a sinus infection if you:   Are older in age   Have structural problems with your sinuses   Smoke or are exposed to secondhand smoke   Are exposed to changes in pressure, such as from flying a lot or deep sea diving   Have asthma or allergies   Have a weak immune system   Have dental disease    Symptoms of acute rhinosinusitis  Symptoms of acute rhinosinusitis often last around 7 to 10 days. If you have a bacterial infection, they may last longer. They may also get better but then worsen. You may have:   Facepain or pressure under the eyes and around the nose   Headache   Fluid draining in the back of the throat (postnasal drip)   Congestion   Drainage that is thick and colored (often green), instead of clear   Cough   Problems with your sense of smell   Ear pain or hearing problems   Fever   Tooth pain   Fatigue  Diagnosing acute rhinosinusitis  Yourhealthcare provider will ask about your  symptoms and past health.He or she will look at your ears, nose, throat, and sinuses. Imaging tests, such as X-rays, are often not needed.  It can be hard to figure out if a sinus infection is caused by a virus or bacterium. A bacterial infection tends to last longer. Symptoms may also get better but then worsen. Your healthcare provider may take asample of mucus from your nose to check for bacteria.  Treating acute rhinosinusitis  Most sinus infections will go away within 10 days. Your body will fight off the virus. If your symptoms seem to get better but then worsen, you may have a bacterial infection instead. Your healthcare provider will then give you antibiotics. Take this medicine until it is gone, even if you feel better.  To help ease your symptoms, your healthcare provider may advise:   Over-the-counter pain relievers. Medicines such as acetaminophen or ibuprofen can ease sinus pain. They may also lower a fever.   Nasal washes. Washing your nasal passages with salt water may ease pain and pressure. It can rinse out mucous and other irritants from your sinuses. Your healthcare provider can show you how to do it.   Nasal steroid spray. This prescription medicine can reduce inflammation in your sinuses.   Other medicines. Decongestants, antihistamines, and other nasal sprays may give short-term   relief. They may help with congestion. Talk with your healthcare provider before taking these medicines.    Preventing acute rhinosinusitis  You can help prevent a sinus infection with these steps:   Wash your hands well and often.   Stay away from people who have a cold or upper respiratory infection.   Don't smoke. And stay away from secondhand smoke.   Use a humidifier at home.   Make sure you are up-to-date on your vaccines, such as the flu shot.    When to call your healthcare provider  Call your healthcare provider right away if you have any of these:   Fever of 100.4F (38C) or higher, or as  directed by your healthcare provider   Pain that gets worse   Symptoms that don't get better, or get worse   New symptoms  [employer] last reviewed this educational content on 03/03/2018   2000-2021 [employer]. All rights reserved. This information is not intended as a substitute for professional medical care. Always follow your healthcare professional's instructions.

## 2020-05-10 ENCOUNTER — Encounter (INDEPENDENT_AMBULATORY_CARE_PROVIDER_SITE_OTHER): Payer: Self-pay

## 2020-06-10 ENCOUNTER — Encounter (INDEPENDENT_AMBULATORY_CARE_PROVIDER_SITE_OTHER): Payer: Self-pay

## 2020-07-10 ENCOUNTER — Ambulatory Visit (INDEPENDENT_AMBULATORY_CARE_PROVIDER_SITE_OTHER): Payer: Medicare Other | Admitting: Family Medicine

## 2020-07-10 ENCOUNTER — Encounter (INDEPENDENT_AMBULATORY_CARE_PROVIDER_SITE_OTHER): Payer: Self-pay

## 2020-07-10 VITALS — BP 139/78 | HR 64 | Temp 97.4°F | Ht 64.0 in | Wt 154.0 lb

## 2020-07-10 DIAGNOSIS — J0110 Acute frontal sinusitis, unspecified: Secondary | ICD-10-CM

## 2020-07-10 DIAGNOSIS — J301 Allergic rhinitis due to pollen: Secondary | ICD-10-CM

## 2020-07-10 MED ORDER — CEFDINIR 300 MG PO CAPS
600.0000 mg | ORAL_CAPSULE | Freq: Every day | ORAL | 0 refills | Status: AC
Start: 2020-07-10 — End: 2020-07-20

## 2020-07-10 NOTE — Progress Notes (Signed)
[facility] Breast Cancer Follow Up    Treatment Team  Ref Prov:  Primary Care Physician:  Raj Janus, MD Radiology facility:[facility])--[phone] Breast Surgeon :Henderson Baltimore MD  [phone]   Plastic Surgeonnone Radiation Onc:  Miguel Dibble MD: [phone] [facility]); Medical Onc:  Laney Pastor, MD [phone] [facility])      Breast Cancer Comprehensive Care Plan Summary  Date of diagnosis: 11/03/2018 Event : DCIS ECOG Performance:Grade 0 (Fully active) Genetic Testing:not indicated   Presentation :Abnormal mammogram with microcalcifications Side: left Focality: unifocal Location:radian 12:00   Biologic Tumor characteristics  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 - not indicated Oncotype Dx:  DCIS , not indicated   Estrogen Receptor: positive 19 % Progesterone Receptor: negative Her-2-neu Receptor: DCIS, not indicated Androgen Receptor;  not done   Staging  Tumor Size:   DCIS with <71mm microinvasion Nodes: clinically/US negative Systemic Metastases: clinically negative Stage: Stage0 , Tmic N0 M0   Treatment  Modality Date    Surgery   12/25/18    01/15/2019 left, partial mastectomy with needle localization    Re-excision lumpectomy    Radiation  Whole breast radiation completed on 04/02/2019   Endocrine  not felt to be warranted as the patient was only weakly ER positive   Chemotherapy     Biologic     Clinical trial         HPI   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 female patient here for follow up of her history of left breast cancer originally diagnosed in February 2020.  She is currently on No endocrine therapy    She has intermittent left breast pain after working out/lifting and intermittent left nipple sensitivity. The patient is otherwise asymptomatic and denies any persistent breast pain , palpable masses, skin or nipple changes or nipple  discharge.     Her daughter just got married in August in [place].     Recent Imaging:  LDM 07/18/19: No mammographic evidence for malignancy. BIRADS2    BDM 11/06/19: Left lumpectomy changes with No mammographic evidence for malignancy. BIRADS2    The following portions of the patient's history were reviewed and updated as appropriate: allergies, current medications, past family history, past medical history, past social history, past surgical history and problem list.     has a past medical history of Anxiety, Arrhythmia, Chronic kidney disease, Diabetes mellitus, type 2, Gastroesophageal reflux disease, Hemorrhagic diathesis (06/2002), Hyperlipidemia, Hypertension, Malignant neoplasm of breast (2020), PAN (polyarteritis nodosa) (2003), and Sleep apnea.    Family History     Family History   Problem Relation Age of Onset   . Diabetes Mother    . Thyroid disease Mother    . No known problems Father         Review of Systems   See scan.     Physical Exam   There were no vitals taken for this visit.    WDWN female in NAD  HEENT:  Clear, no scleral icterus, neck supple  Neck:  No thyromegaly or masses, trachea midline  Chest:  Respiratory effort normal  BJM:  Gait and station normal  Ext:  No CCE  Skin:  Free of significant ulcers or lesions  Neuro:  Grossly intact, no focal findings, alert and oriented, asks appropriate questions  LN:  No axillary, supraclavicular or cervical adenopathy  Breast:  Symmetric .  Right breast: No skin or nipple changes, no nipple retraction or discharge . No palpable masses    Left breast : No skin or nipple changes, no nipple retraction or discharge . No palpable masses. Well healed incision.    Rads   Radiological imaging and reports reviewed as above.     Assessment/Plan   1. Personal history of breast carcinoma    Patient is doing well. She currently has no evidence of disease .  Her physical and emotional recovery is good. Cosmetic outcome is good .  I have recommended continued  treatment with Serial imaging    Surveillance / Folllow up recommendations:    I reviewed the importance of breast exams and follow up at six month intervals for the first five years after the first treatment; alternating exams at 6 month intervals with medical oncology is a good option between years three and five to limit the number of times needed for follow up ; and every year thereafter.  I have recommended long term follow-up because there is a low but measurable risk of breast cancer recurrence even more than 10 years after treatment. In addition, if  there is remaining breast tissue, there is a higher risk to develop a new, independent, breast cancer at some time in the future.    After lumpectomy, we recommend mammograms once a year. The purpose is to monitor for local recurrence and detection of new primary.    Patient is advised to report these symptoms : new lumps, bone pain, chest pain, shortness of breath or difficulty breathing, abdominal pain, or persistent headaches.     The following tests are not recommended for routine breast cancer follow-up: breast MRI, FDG-PET scans, complete blood cell counts, automated chemistry studies, chest x-rays, bone scans, liver ultrasound, and tumor markers (CA 15-3, CA 27.29, CEA).     Nutrition  I have also discussed life-style choices to decrease risk of breast cancer such as:   Healthy eating habits, meals high in fruits, vegetables, nuts and complex carbohydrates, low in animal fats.  Limit the amount of alcohol ingested   Avoid  Hormone Replacement Therapy.    Exercise  Continue to exercise, aerobic 30 - 40 minutes , 3 -4 times / week    Follow up  Continue healthy lifestyle.   Bilateral mammogram in February 2022  Follow up visit with me  in 6 months, sooner if concerns. Can then move to annual follow up.   Follow up with Medical Oncology and Radiation Oncology.  Patient agrees with plan.

## 2020-07-10 NOTE — Patient Instructions (Signed)
Sinusitis (Antibiotic Treatment)    REST ACTIVELY, PLENTY FLUIDS, HEALTHY DIET    WHEN BETTER, NASAL SALINE / NETI POT        The sinuses are air-filled spaces within the bones of the face. They connect to the inside of the nose. Sinusitis is an inflammation of the tissue that lines the sinuses. Sinusitis can occur during a cold. It can also happen due to allergies to pollens and other particles in the air. Sinusitis can cause symptoms of sinus congestion and a feeling of fullness. A sinus infection causes fever, headache, and facial pain. There is often green or yellow fluid draining from the nose or into the back of the throat (post-nasal drip). You have been given antibiotics to treat this condition.   Home care  · Take the full course of antibiotics as instructed. Don't stop taking them, even when you feel better.  · Drink plenty of water, hot tea, and other liquids as directed by the healthcare provider. This may help thin nasal mucus. It also may help your sinuses drain fluids.  · Heat may help soothe painful areas of your face. Use a towel soaked in hot water. Or, stand in the shower and direct the warm spray onto your face. Using a vaporizer along with a menthol rub at night may also help soothe symptoms.   · An expectorant with guaifenesin may help thin nasal mucus and help your sinuses drain fluids. Talk with your provider or pharmacists before taking an over-the-counter (OTC) medicine if you have any questions about it or its side effects..  · You can use an OTC decongestant, unless a similar medicine was prescribed to you. Nasal sprays work the fastest. Use one that contains phenylephrine or oxymetazoline. First blow your nose gently. Then use the spray. Don't use these medicines more often than directed on the label. If you do, your symptoms may get worse. You may also take pills that contain pseudoephedrine. Don’t use products that combine multiple medicines. This is because side effects may be  increased. Read labels. You can also ask the pharmacist for help. (People with high blood pressure should not use decongestants. They can raise blood pressure.) Talk with your provider or pharmacist if you have any questions about the medicine..  · OTC antihistamines may help if allergies contributed to your sinusitis. Talk with your provider or pharmacist if you have any questions about the medicine..  · Don't use nasal rinses or irrigation during an acute sinus infection, unless your healthcare provider tells you to. Rinsing may spread the infection to other areas in your sinuses.  · Use acetaminophen or ibuprofen to control pain, unless another pain medicine was prescribed to you. If you have chronic liver or kidney disease or ever had a stomach ulcer, talk with your healthcare provider before using these medicines. Never give aspirin to anyone under age 18 who is ill with a fever. It may cause severe liver damage.  · Don't smoke. This can make symptoms worse.    Follow-up care  Follow up with your healthcare provider, or as advised.   When to seek medical advice  Call your healthcare provider if any of these occur:   · Facial pain or headache that gets worse  · Stiff neck  · Unusual drowsiness or confusion  · Swelling of your forehead or eyelids  · Symptoms don't go away in 10 days  · Vision problems, such as blurred or double vision  · Fever of 100.4ºF (38ºC) or higher, or as directed by your healthcare provider  Call   911  Call 911 if any of these occur:   · Seizure  · Trouble breathing  · Feeling dizzy or faint  · Fingernails, skin or lips look blue, purple , or gray  Prevention  Here are steps you can take to help prevent an infection:   · Keep good hand washing habits.  · Don’t have close contact with people who have sore throats, colds, or other upper respiratory infections.  · Don’t smoke, and stay away from secondhand smoke.  · Stay up to date with of your vaccines.  [employer] last reviewed this  educational content on 09/02/2018  © 2000-2021 [employer]. All rights reserved. This information is not intended as a substitute for professional medical care. Always follow your healthcare professional's instructions.

## 2020-07-10 NOTE — Progress Notes (Signed)
[facility]  PROGRESS NOTE     Patient: Katelyn Caldwell   Date: 07/10/2020   MRN: [id number]       Katelyn Caldwell is a 69 y.o. female      HISTORY     History obtained from: Patient    Chief Complaint   Patient presents with    Sinus Problem     Sinus congestion, fluid in right ear, pressure on top of head x 2 weeks.  2nd in March        Patient c/o sinus h/a; pressure bilateral maxillary toothache x 2 weeks.           Review of Systems    History:    Pertinent Past Medical, Surgical, Family and Social History were reviewed.        Current Outpatient Medications:     atorvastatin (LIPITOR) 40 MG tablet, Take 1 tablet (40 mg total) by mouth daily, Disp: 90 tablet, Rfl: 3    cyanocobalamin ([employer] VITAMIN B12) 1000 MCG tablet, Take 0.5 tablets (500 mcg total) by mouth daily, Disp: 45 tablet, Rfl: 3    fluticasone (FLONASE) 50 MCG/ACT nasal spray, 2 sprays by Nasal route every evening  , Disp: , Rfl:     glucose blood (FREESTYLE LITE) test strip, Pt uses freestyle freedom lyte test strips testing BID prn, Disp: 300 each, Rfl: 3    Lancets (FREESTYLE) lancets, Use twice daily as needed, Disp: 300 each, Rfl: 3    lidocaine (LIDODERM) 5 %, Place 1 patch onto affected skin every twelve hours as needed for discomfort.  Remove each patch after 12 hours of use, Disp: 30 patch, Rfl: 2    losartan (COZAAR) 25 MG tablet, Take 1 tablet (25 mg total) by mouth daily, Disp: 90 tablet, Rfl: 1    Magnesium 400 MG Tab, Take 400 mg by mouth daily, Disp: , Rfl:     metFORMIN (GLUCOPHAGE-XR) 500 MG 24 hr tablet, Take 1 tablet (500 mg total) by mouth nightly, Disp: 90 tablet, Rfl: 3    metoprolol succinate XL (TOPROL-XL) 25 MG 24 hr tablet, Take 25 mg by mouth daily  , Disp: , Rfl:     pantoprazole (PROTONIX) 40 MG tablet, Take 1 tablet (40 mg total) by mouth every evening, Disp: 90 tablet, Rfl: 3    Probiotic Product (PROBIOTIC-10 PO), Take by mouth every other day , Disp: , Rfl:     SITagliptin-MetFORMIN HCl (Janumet  XR) 50-500 MG Tablet SR 24 hr, Take 1 tablet by mouth 2 (two) times daily with meals, Disp: 180 tablet, Rfl: 3    vitamin D (CHOLECALCIFEROL) 25 MCG (1000 UT) tablet, Take 1 tablet (1,000 Units total) by mouth daily, Disp:  , Rfl:     cefdinir (OMNICEF) 300 MG capsule, Take 2 capsules (600 mg total) by mouth daily for 10 days, Disp: 20 capsule, Rfl: 0    Allergies   Allergen Reactions    Ciprofloxacin Other (See Comments)     Severe Connective Tissue (joint) pain and stiffness    Nsaids Other (See Comments)     Pt states this causes her bleeding d/t PAN       Medications and Allergies reviewed.    PHYSICAL EXAM     Vitals:    07/10/20 1616   BP: 139/78   Pulse: 64   Temp: 97.4 F (36.3 C)   TempSrc: Tympanic   SpO2: 97%   Weight: 69.9 kg (154 lb)   Height:  1.626 m (5\' 4" )       Physical Exam  Constitutional:       General: She is not in acute distress.     Appearance: She is well-developed. She is not diaphoretic.   HENT:      Right Ear: External ear normal.      Left Ear: External ear normal.      Mouth/Throat:      Pharynx: No oropharyngeal exudate.     Eyes: Conjunctivae and EOM are normal. Pupils are equal, round, and reactive to light. Cardiovascular:      Rate and Rhythm: Regular rhythm.      Heart sounds: Normal heart sounds. No murmur heard.   No gallop.    Pulmonary:      Effort: Pulmonary effort is normal. No respiratory distress.      Breath sounds: Normal breath sounds. No wheezing or rales.   Musculoskeletal:      Cervical back: Normal range of motion and neck supple.   Lymphadenopathy:      Cervical: No cervical adenopathy.   Neurological:      Mental Status: She is alert and oriented to person, place, and time.      Cranial Nerves: No cranial nerve deficit.   Skin:     General: Skin is warm and dry.      Coloration: Skin is not pale.      Findings: No erythema or rash.   Chest:      Chest wall: No tenderness.   Vitals and nursing note reviewed.         UCC COURSE     LABS  The following POCT  tests were ordered, reviewed and discussed with the patient/family.     Results     ** No results found for the last 24 hours. **          X-Ray  The following X-ray studies were ordered, visualized and independently interpreted by me. Results were discussed with the patient/family.     No results found.    No current facility-administered medications for this visit.       PROCEDURES     Procedures    MEDICAL DECISION MAKING     History, physical, labs/studies most consistent with ac sinusitis as the diagnosis.    Chart Review:  Prior PCP, Specialist and/or ED notes reviewed today: Yes  Prior labs/images/studies reviewed today: Yes    Differential Diagnosis: allergic rhinitis complicated with acute sinusitis.   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    LAST CREAT = 1.2  IN April, 2012    ASSESSMENT     Encounter Diagnoses   Name Primary?    Acute frontal sinusitis, recurrence not specified Yes    Allergic rhinitis due to pollen, unspecified seasonality             PLAN      PLAN: Patient to follow up with [facility] primary care doctor or GO TO THE E.R. if symptoms worsening or not resolving as expected.   Patient verbalized understanding.   Reviewed discharge instructions that are included with patient's MyChart. Questions answered.     F/ upcp      No orders of the defined types were placed in this encounter.    Requested Prescriptions     Signed Prescriptions Disp Refills    cefdinir (OMNICEF) 300 MG capsule 20 capsule 0     Sig: Take 2 capsules (600 mg total)  by mouth daily for 10 days       Discussed results and diagnosis with patient/family.  Reviewed warning signs for worsening condition, as well as, indications for follow-up with primary care physician and return to urgent care clinic.   Patient/family expressed understanding of instructions.     An After Visit Summary was provided to the patient.

## 2020-07-13 ENCOUNTER — Encounter (INDEPENDENT_AMBULATORY_CARE_PROVIDER_SITE_OTHER): Payer: Self-pay | Admitting: Family Medicine

## 2020-07-13 ENCOUNTER — Ambulatory Visit (INDEPENDENT_AMBULATORY_CARE_PROVIDER_SITE_OTHER): Payer: Medicare Other | Admitting: Family Nurse Practitioner

## 2020-07-13 ENCOUNTER — Encounter (HOSPITAL_BASED_OUTPATIENT_CLINIC_OR_DEPARTMENT_OTHER): Payer: Self-pay | Admitting: Family Nurse Practitioner

## 2020-07-13 VITALS — BP 138/81 | HR 70 | Temp 97.2°F | Resp 16 | Ht 64.0 in | Wt 154.2 lb

## 2020-07-13 DIAGNOSIS — Z853 Personal history of malignant neoplasm of breast: Secondary | ICD-10-CM

## 2020-07-21 ENCOUNTER — Encounter (INDEPENDENT_AMBULATORY_CARE_PROVIDER_SITE_OTHER): Payer: Self-pay | Admitting: Family Medicine

## 2020-08-05 ENCOUNTER — Other Ambulatory Visit: Payer: Self-pay | Admitting: Podiatry

## 2020-08-18 ENCOUNTER — Encounter (INDEPENDENT_AMBULATORY_CARE_PROVIDER_SITE_OTHER): Payer: Self-pay | Admitting: Family Medicine

## 2020-08-18 DIAGNOSIS — E119 Type 2 diabetes mellitus without complications: Secondary | ICD-10-CM

## 2020-08-18 DIAGNOSIS — E7800 Pure hypercholesterolemia, unspecified: Secondary | ICD-10-CM

## 2020-08-18 NOTE — Progress Notes (Signed)
Dr. Hildred Laser patient last Office visit 01/08/20

## 2020-08-24 ENCOUNTER — Encounter (INDEPENDENT_AMBULATORY_CARE_PROVIDER_SITE_OTHER): Payer: Self-pay | Admitting: Family Medicine

## 2020-08-24 MED ORDER — LIDOCAINE 5 % EX PTCH
MEDICATED_PATCH | CUTANEOUS | 2 refills | Status: DC
Start: 2020-08-24 — End: 2021-01-22

## 2020-08-24 MED ORDER — LOSARTAN POTASSIUM 25 MG PO TABS
25.0000 mg | ORAL_TABLET | Freq: Every day | ORAL | 1 refills | Status: DC
Start: 2020-08-24 — End: 2021-01-22

## 2020-08-24 MED ORDER — ATORVASTATIN CALCIUM 40 MG PO TABS
40.0000 mg | ORAL_TABLET | Freq: Every day | ORAL | 3 refills | Status: DC
Start: 2020-08-24 — End: 2021-06-02

## 2020-08-24 MED ORDER — PANTOPRAZOLE SODIUM 40 MG PO TBEC
40.0000 mg | DELAYED_RELEASE_TABLET | Freq: Every evening | ORAL | 3 refills | Status: DC
Start: 2020-08-24 — End: 2022-01-18

## 2020-09-02 NOTE — Patient Instructions (Signed)
Casey Weber  09/02/2020     @PREFPERIOPPHARMACY @   Your procedure is scheduled on  09/09/2020.  Report to 14/05/2020 at  1145  A.M.  Call this number if you have problems the morning of surgery:  [phone]   Remember:  Do not eat or drink after midnight.                       Take these medicines the morning of surgery with A SIP OF WATER  Nexium, levothyroxine, singulair, topamax.    Do not wear jewelry, make-up or nail polish.  Do not wear lotions, powders, or perfumes. Please wear deodorant and brush your teeth.  Do not shave 48 hours prior to surgery.  Men may shave face and neck.  Do not bring valuables to the hospital.  [facility] is not responsible for any belongings or valuables.  Contacts, dentures or bridgework may not be worn into surgery.  Leave your suitcase in the car.  After surgery it may be brought to your room.  For patients admitted to the hospital, discharge time will be determined by your treatment team.  Patients discharged the day of surgery will not be allowed to drive home.   Name and phone number of your driver:   family Special instructions:   DO NOT smoke the morning of your procedure.  Please read over the following fact sheets that you were given. Anesthesia Post-op Instructions and Care and Recovery After Surgery       Metatarsal Osteotomy, Care After This sheet gives you information about how to care for yourself after your procedure. Your health care provider may also give you more specific instructions. If you have problems or questions, contact your health care provider. What can I expect after the procedure? After the procedure, it is common to have:  Soreness.  Pain.  Stiffness.  Swelling. Follow these instructions at home: Medicines  Take over-the-counter and prescription medicines only as told by your health care provider.  Ask your health care provider if the medicine prescribed to you can cause  constipation. You may need to take steps to prevent or treat constipation, such as: ? Drink enough fluid to keep your urine pale yellow. ? Take over-the-counter or prescription medicines. ? Eat foods that are high in fiber, such as beans, whole grains, and fresh fruits and vegetables. ? Limit foods that are high in fat and processed sugars, such as fried or sweet foods. If you have a splint or walking boot:  Wear it as told by your health care provider. Remove it only as told by your health care provider.  Loosen it if your toes tingle, become numb, or turn cold and blue.  Keep it clean.  If it is not waterproof: ? Do not let it get wet. ? Cover it with a watertight covering when you take a bath or shower. Bathing  Do not take baths, swim, or use a hot tub until your health care provider approves. Ask your health care provider if you may take showers. You may only be allowed to take sponge baths.  Keep the bandage (dressing) dry. Incision care   Follow instructions from your health care provider about how to take care of your incision. Make sure you: ? Wash your hands with soap and water before you change your bandage (dressing). If soap and water are not available, use hand sanitizer. ? Change your dressing as  told by your health care provider. ? Leave stitches (sutures), skin glue, or adhesive strips in place. These skin closures may need to stay in place for 2 weeks or longer. If adhesive strip edges start to loosen and curl up, you may trim the loose edges. Do not remove adhesive strips completely unless your health care provider tells you to do that.  Check your incision area every day for signs of infection. Check for: ? More redness, swelling, or pain. ? Fluid or blood. ? Warmth. ? Pus or a bad smell. Managing pain, stiffness, and swelling   If directed, put ice on the injured area. ? If you have a removable splint, remove it as told by your health care provider. ? Put  ice in a plastic bag. ? Place a towel between your skin and the bag. ? Leave the ice on for 20 minutes, 2-3 times a day.  Move your toes often to avoid stiffness and to lessen swelling.  Raise (elevate) the injured area above the level of your heart while you are sitting or lying down. Driving  Do not drive or use heavy machinery while taking prescription pain medicine.  Do not drive for 24 hours if you were given a sedative during your procedure.  Ask your health care provider when it is safe to drive if you have a dressing, splint, special shoe, or walking boot on your foot. General instructions  Do not remove the bandage (dressing) around your foot until directed by your health care provider.  If you were given a splint, special shoe, or walking boot, wear it as told by your health care provider.  Do not use the injured limb to support your body weight until your health care provider says that you can. Use crutches or a [place] as told by your health care provider.  Return to your normal activities as told by your health care provider. Ask your health care provider what activities are safe for you.  Do not use any products that contain nicotine or tobacco, such as cigarettes, e-cigarettes, and chewing tobacco. These can delay bone healing. If you need help quitting, ask your health care provider.  Keep all follow-up visits as told by your health care provider. This is important. Contact a health care provider if:  You have a fever.  Your dressing becomes wet, loose, or stained with blood or discharge.  You have pus or a bad smell coming from your incision or bandage.  Your foot becomes red, swollen, or tender.  You have pain or stiffness that does not get better or gets worse.  You have tingling or numbness in your foot that does not get better or gets worse. Get help right away if:  You develop a warm and tender swelling in your leg.  You have chest pain.  You have  trouble breathing. Summary  After the procedure, it is common to have soreness, pain, stiffness, and swelling.  Follow instructions on caring for your incision, changing your dressing, and using weight support to protect your foot.  Contact your health care provider if you have a fever, pus or a bad smell coming from your wound or dressing, or pain and stiffness do not get better.  Get help right away if you develop a warm and tender swelling in your leg, have chest pain, or trouble breathing. This information is not intended to replace advice given to you by your health care provider. Make sure you discuss any questions you have  with your health care provider. Document Revised: 08/03/2018 Document Reviewed: 08/03/2018 Elsevier Patient Education  2020 [employer]  Monitored Anesthesia Care, Care After These instructions provide you with information about caring for yourself after your procedure. Your health care provider may also give you more specific instructions. Your treatment has been planned according to current medical practices, but problems sometimes occur. Call your health care provider if you have any problems or questions after your procedure. What can I expect after the procedure? After your procedure, you may:  Feel sleepy for several hours.  Feel clumsy and have poor balance for several hours.  Feel forgetful about what happened after the procedure.  Have poor judgment for several hours.  Feel nauseous or vomit.  Have a sore throat if you had a breathing tube during the procedure. Follow these instructions at home: For at least 24 hours after the procedure:      Have a responsible adult stay with you. It is important to have someone help care for you until you are awake and alert.  Rest as needed.  Do not: ? Participate in activities in which you could fall or become injured. ? Drive. ? Use heavy machinery. ? Drink alcohol. ? Take sleeping pills or  medicines that cause drowsiness. ? Make important decisions or sign legal documents. ? Take care of children on your own. Eating and drinking  Follow the diet that is recommended by your health care provider.  If you vomit, drink water, juice, or soup when you can drink without vomiting.  Make sure you have little or no nausea before eating solid foods. General instructions  Take over-the-counter and prescription medicines only as told by your health care provider.  If you have sleep apnea, surgery and certain medicines can increase your risk for breathing problems. Follow instructions from your health care provider about wearing your sleep device: ? Anytime you are sleeping, including during daytime naps. ? While taking prescription pain medicines, sleeping medicines, or medicines that make you drowsy.  If you smoke, do not smoke without supervision.  Keep all follow-up visits as told by your health care provider. This is important. Contact a health care provider if:  You keep feeling nauseous or you keep vomiting.  You feel light-headed.  You develop a rash.  You have a fever. Get help right away if:  You have trouble breathing. Summary  For several hours after your procedure, you may feel sleepy and have poor judgment.  Have a responsible adult stay with you for at least 24 hours or until you are awake and alert. This information is not intended to replace advice given to you by your health care provider. Make sure you discuss any questions you have with your health care provider. Document Revised: 12/18/2017 Document Reviewed: 01/10/2016 Elsevier Patient Education  2020 [employer]. How to Use Chlorhexidine for Bathing Chlorhexidine gluconate (CHG) is a germ-killing (antiseptic) solution that is used to clean the skin. It can get rid of the bacteria that normally live on the skin and can keep them away for about 24 hours. To clean your skin with CHG, you may be  given:  A CHG solution to use in the shower or as part of a sponge bath.  A prepackaged cloth that contains CHG. Cleaning your skin with CHG may help lower the risk for infection:  While you are staying in the intensive care unit of the hospital.  If you have a vascular access, such as a central  line, to provide short-term or long-term access to your veins.  If you have a catheter to drain urine from your bladder.  If you are on a ventilator. A ventilator is a machine that helps you breathe by moving air in and out of your lungs.  After surgery. What are the risks? Risks of using CHG include:  A skin reaction.  Hearing loss, if CHG gets in your ears.  Eye injury, if CHG gets in your eyes and is not rinsed out.  The CHG product catching fire. Make sure that you avoid smoking and flames after applying CHG to your skin. Do not use CHG:  If you have a chlorhexidine allergy or have previously reacted to chlorhexidine.  On babies younger than 44 months of age. How to use CHG solution  Use CHG only as told by your health care provider, and follow the instructions on the label.  Use the full amount of CHG as directed. Usually, this is one bottle. During a shower Follow these steps when using CHG solution during a shower (unless your health care provider gives you different instructions): 1. Start the shower. 2. Use your normal soap and shampoo to wash your face and hair. 3. Turn off the shower or move out of the shower stream. 4. Pour the CHG onto a clean washcloth. Do not use any type of brush or rough-edged sponge. 5. Starting at your neck, lather your body down to your toes. Make sure you follow these instructions: ? If you will be having surgery, pay special attention to the part of your body where you will be having surgery. Scrub this area for at least 1 minute. ? Do not use CHG on your head or face. If the solution gets into your ears or eyes, rinse them well with  water. ? Avoid your genital area. ? Avoid any areas of skin that have broken skin, cuts, or scrapes. ? Scrub your back and under your arms. Make sure to wash skin folds. 6. Let the lather sit on your skin for 1-2 minutes or as long as told by your health care provider. 7. Thoroughly rinse your entire body in the shower. Make sure that all body creases and crevices are rinsed well. 8. Dry off with a clean towel. Do not put any substances on your body afterward--such as powder, lotion, or perfume--unless you are told to do so by your health care provider. Only use lotions that are recommended by the manufacturer. 9. Put on clean clothes or pajamas. 10. If it is the night before your surgery, sleep in clean sheets.  During a sponge bath Follow these steps when using CHG solution during a sponge bath (unless your health care provider gives you different instructions): 1. Use your normal soap and shampoo to wash your face and hair. 2. Pour the CHG onto a clean washcloth. 3. Starting at your neck, lather your body down to your toes. Make sure you follow these instructions: ? If you will be having surgery, pay special attention to the part of your body where you will be having surgery. Scrub this area for at least 1 minute. ? Do not use CHG on your head or face. If the solution gets into your ears or eyes, rinse them well with water. ? Avoid your genital area. ? Avoid any areas of skin that have broken skin, cuts, or scrapes. ? Scrub your back and under your arms. Make sure to wash skin folds. 4. Let the lather sit  on your skin for 1-2 minutes or as long as told by your health care provider. 5. Using a different clean, wet washcloth, thoroughly rinse your entire body. Make sure that all body creases and crevices are rinsed well. 6. Dry off with a clean towel. Do not put any substances on your body afterward--such as powder, lotion, or perfume--unless you are told to do so by your health care provider.  Only use lotions that are recommended by the manufacturer. 7. Put on clean clothes or pajamas. 8. If it is the night before your surgery, sleep in clean sheets. How to use CHG prepackaged cloths  Only use CHG cloths as told by your health care provider, and follow the instructions on the label.  Use the CHG cloth on clean, dry skin.  Do not use the CHG cloth on your head or face unless your health care provider tells you to.  When washing with the CHG cloth: ? Avoid your genital area. ? Avoid any areas of skin that have broken skin, cuts, or scrapes. Before surgery Follow these steps when using a CHG cloth to clean before surgery (unless your health care provider gives you different instructions): 1. Using the CHG cloth, vigorously scrub the part of your body where you will be having surgery. Scrub using a back-and-forth motion for 3 minutes. The area on your body should be completely wet with CHG when you are done scrubbing. 2. Do not rinse. Discard the cloth and let the area air-dry. Do not put any substances on the area afterward, such as powder, lotion, or perfume. 3. Put on clean clothes or pajamas. 4. If it is the night before your surgery, sleep in clean sheets.  For general bathing Follow these steps when using CHG cloths for general bathing (unless your health care provider gives you different instructions). 1. Use a separate CHG cloth for each area of your body. Make sure you wash between any folds of skin and between your fingers and toes. Wash your body in the following order, switching to a new cloth after each step: ? The front of your neck, shoulders, and chest. ? Both of your arms, under your arms, and your hands. ? Your stomach and groin area, avoiding the genitals. ? Your right leg and foot. ? Your left leg and foot. ? The back of your neck, your back, and your buttocks. 2. Do not rinse. Discard the cloth and let the area air-dry. Do not put any substances on your body  afterward--such as powder, lotion, or perfume--unless you are told to do so by your health care provider. Only use lotions that are recommended by the manufacturer. 3. Put on clean clothes or pajamas. Contact a health care provider if:  Your skin gets irritated after scrubbing.  You have questions about using your solution or cloth. Get help right away if:  Your eyes become very red or swollen.  Your eyes itch badly.  Your skin itches badly and is red or swollen.  Your hearing changes.  You have trouble seeing.  You have swelling or tingling in your mouth or throat.  You have trouble breathing.  You swallow any chlorhexidine. Summary  Chlorhexidine gluconate (CHG) is a germ-killing (antiseptic) solution that is used to clean the skin. Cleaning your skin with CHG may help to lower your risk for infection.  You may be given CHG to use for bathing. It may be in a bottle or in a prepackaged cloth to use on your skin.  Carefully follow your health care provider's instructions and the instructions on the product label.  Do not use CHG if you have a chlorhexidine allergy.  Contact your health care provider if your skin gets irritated after scrubbing. This information is not intended to replace advice given to you by your health care provider. Make sure you discuss any questions you have with your health care provider. Document Revised: 12/06/2018 Document Reviewed: 08/17/2017 Elsevier Patient Education  [place].

## 2020-09-07 ENCOUNTER — Encounter (INDEPENDENT_AMBULATORY_CARE_PROVIDER_SITE_OTHER): Payer: Self-pay | Admitting: Family Medicine

## 2020-09-07 ENCOUNTER — Encounter (HOSPITAL_COMMUNITY)
Admission: RE | Admit: 2020-09-07 | Discharge: 2020-09-07 | Disposition: A | Discharge status: Home / Self Care | Payer: Medicare Other | Source: Ambulatory Visit | Attending: Podiatry | Admitting: Podiatry

## 2020-09-07 ENCOUNTER — Ambulatory Visit (HOSPITAL_COMMUNITY)
Admission: RE | Admit: 2020-09-07 | Discharge: 2020-09-07 | Disposition: A | Discharge status: Home / Self Care | Payer: Medicare Other | Source: Ambulatory Visit | Attending: Podiatry | Admitting: Podiatry

## 2020-09-07 ENCOUNTER — Other Ambulatory Visit: Payer: Self-pay

## 2020-09-07 ENCOUNTER — Other Ambulatory Visit (HOSPITAL_COMMUNITY)
Admission: RE | Admit: 2020-09-07 | Discharge: 2020-09-07 | Disposition: A | Discharge status: Home / Self Care | Payer: Medicare Other | Source: Ambulatory Visit | Attending: Podiatry | Admitting: Podiatry

## 2020-09-07 ENCOUNTER — Encounter (HOSPITAL_COMMUNITY): Payer: Self-pay

## 2020-09-07 DIAGNOSIS — Z01818 Encounter for other preprocedural examination: Secondary | ICD-10-CM | POA: Insufficient documentation

## 2020-09-07 DIAGNOSIS — Z20822 Contact with and (suspected) exposure to covid-19: Secondary | ICD-10-CM | POA: Insufficient documentation

## 2020-09-07 DIAGNOSIS — M2011 Hallux valgus (acquired), right foot: Secondary | ICD-10-CM | POA: Insufficient documentation

## 2020-09-07 LAB — CBC WITH DIFFERENTIAL/PLATELET
Abs Immature Granulocytes: 0.03 10*3/uL (ref 0.00–0.07)
Basophils Absolute: 0 10*3/uL (ref 0.0–0.1)
Basophils Relative: 1 %
Eosinophils Absolute: 0.2 10*3/uL (ref 0.0–0.5)
Eosinophils Relative: 3 %
HCT: 38.7 % (ref 36.0–46.0)
Hemoglobin: 12.4 g/dL (ref 12.0–15.0)
Immature Granulocytes: 1 %
Lymphocytes Relative: 14 %
Lymphs Abs: 0.8 10*3/uL (ref 0.7–4.0)
MCH: 28.8 pg (ref 26.0–34.0)
MCHC: 32 g/dL (ref 30.0–36.0)
MCV: 89.8 fL (ref 80.0–100.0)
Monocytes Absolute: 0.4 10*3/uL (ref 0.1–1.0)
Monocytes Relative: 7 %
Neutro Abs: 4.4 10*3/uL (ref 1.7–7.7)
Neutrophils Relative %: 74 %
Platelets: 161 10*3/uL (ref 150–400)
RBC: 4.31 MIL/uL (ref 3.87–5.11)
RDW: 13.2 % (ref 11.5–15.5)
WBC: 5.9 10*3/uL (ref 4.0–10.5)
nRBC: 0 % (ref 0.0–0.2)

## 2020-09-07 LAB — BASIC METABOLIC PANEL
Anion gap: 9 (ref 5–15)
BUN: 12 mg/dL (ref 8–23)
CO2: 22 mmol/L (ref 22–32)
Calcium: 9.1 mg/dL (ref 8.9–10.3)
Chloride: 105 mmol/L (ref 98–111)
Creatinine, Ser: 1.12 mg/dL — ABNORMAL HIGH (ref 0.44–1.00)
GFR, Estimated: 53 mL/min — ABNORMAL LOW (ref >60)
Glucose, Bld: 101 mg/dL — ABNORMAL HIGH (ref 70–99)
Potassium: 3.8 mmol/L (ref 3.5–5.1)
Sodium: 136 mmol/L (ref 135–145)

## 2020-09-08 LAB — SARS CORONAVIRUS 2 (TAT 6-24 HRS): SARS Coronavirus 2: NEGATIVE

## 2020-09-09 ENCOUNTER — Other Ambulatory Visit: Payer: Self-pay

## 2020-09-09 ENCOUNTER — Encounter (HOSPITAL_COMMUNITY)
Admission: RE | Disposition: A | Discharge status: Home / Self Care | Payer: Self-pay | Source: Home / Self Care | Attending: Podiatry

## 2020-09-09 ENCOUNTER — Ambulatory Visit (HOSPITAL_COMMUNITY): Payer: Medicare Other

## 2020-09-09 ENCOUNTER — Encounter (HOSPITAL_COMMUNITY): Payer: Self-pay

## 2020-09-09 ENCOUNTER — Ambulatory Visit (HOSPITAL_COMMUNITY)
Admission: RE | Admit: 2020-09-09 | Discharge: 2020-09-09 | Disposition: A | Discharge status: Home / Self Care | Payer: Medicare Other | Attending: Podiatry | Admitting: Podiatry

## 2020-09-09 ENCOUNTER — Ambulatory Visit (HOSPITAL_COMMUNITY): Payer: Medicare Other | Admitting: Anesthesiology

## 2020-09-09 DIAGNOSIS — Z79899 Other long term (current) drug therapy: Secondary | ICD-10-CM | POA: Diagnosis not present

## 2020-09-09 DIAGNOSIS — Z886 Allergy status to analgesic agent status: Secondary | ICD-10-CM | POA: Diagnosis not present

## 2020-09-09 DIAGNOSIS — Z87891 Personal history of nicotine dependence: Secondary | ICD-10-CM | POA: Insufficient documentation

## 2020-09-09 DIAGNOSIS — Z9889 Other specified postprocedural states: Secondary | ICD-10-CM

## 2020-09-09 DIAGNOSIS — Z79890 Hormone replacement therapy (postmenopausal): Secondary | ICD-10-CM | POA: Diagnosis not present

## 2020-09-09 DIAGNOSIS — M21611 Bunion of right foot: Secondary | ICD-10-CM | POA: Insufficient documentation

## 2020-09-09 DIAGNOSIS — Z8673 Personal history of transient ischemic attack (TIA), and cerebral infarction without residual deficits: Secondary | ICD-10-CM | POA: Diagnosis not present

## 2020-09-09 DIAGNOSIS — M2011 Hallux valgus (acquired), right foot: Secondary | ICD-10-CM | POA: Insufficient documentation

## 2020-09-09 DIAGNOSIS — Z885 Allergy status to narcotic agent status: Secondary | ICD-10-CM | POA: Insufficient documentation

## 2020-09-09 DIAGNOSIS — M79671 Pain in right foot: Secondary | ICD-10-CM

## 2020-09-09 HISTORY — PX: BUNIONECTOMY: SHX129

## 2020-09-09 SURGERY — BUNIONECTOMY
Anesthesia: General | Site: Toe | Laterality: Right

## 2020-09-09 MED ORDER — SUCCINYLCHOLINE CHLORIDE 200 MG/10ML IV SOSY
PREFILLED_SYRINGE | INTRAVENOUS | Status: AC
  Filled 2020-09-09: qty 10

## 2020-09-09 MED ORDER — PHENYLEPHRINE HCL (PRESSORS) 10 MG/ML IV SOLN
INTRAVENOUS | Status: DC | PRN
  Administered 2020-09-09 (×7): 100 ug via INTRAVENOUS

## 2020-09-09 MED ORDER — LIDOCAINE HCL (CARDIAC) PF 100 MG/5ML IV SOSY
PREFILLED_SYRINGE | INTRAVENOUS | Status: DC | PRN
  Administered 2020-09-09: 20 mg via INTRAVENOUS

## 2020-09-09 MED ORDER — FENTANYL CITRATE (PF) 100 MCG/2ML IJ SOLN
INTRAMUSCULAR | Status: AC
  Filled 2020-09-09: qty 2

## 2020-09-09 MED ORDER — CEFAZOLIN SODIUM-DEXTROSE 2-4 GM/100ML-% IV SOLN
INTRAVENOUS | Status: AC
  Filled 2020-09-09: qty 100

## 2020-09-09 MED ORDER — DEXAMETHASONE SODIUM PHOSPHATE 10 MG/ML IJ SOLN
INTRAMUSCULAR | Status: DC | PRN
  Administered 2020-09-09: 6 mg via INTRAVENOUS

## 2020-09-09 MED ORDER — MEPERIDINE HCL 50 MG/ML IJ SOLN: 6.2500 mg | INTRAMUSCULAR | Status: DC | PRN

## 2020-09-09 MED ORDER — LACTATED RINGERS IV SOLN: INTRAVENOUS | Status: DC | PRN

## 2020-09-09 MED ORDER — LIDOCAINE HCL 1 % IJ SOLN: INTRAMUSCULAR | Status: DC | PRN

## 2020-09-09 MED ORDER — CEFAZOLIN SODIUM-DEXTROSE 2-4 GM/100ML-% IV SOLN
2.0000 g | INTRAVENOUS | Status: AC
  Administered 2020-09-09: 2 g via INTRAVENOUS

## 2020-09-09 MED ORDER — FENTANYL CITRATE (PF) 100 MCG/2ML IJ SOLN: 25.0000 ug | INTRAMUSCULAR | Status: DC | PRN

## 2020-09-09 MED ORDER — ONDANSETRON HCL 4 MG/2ML IJ SOLN
INTRAMUSCULAR | Status: AC
  Filled 2020-09-09: qty 2

## 2020-09-09 MED ORDER — PHENYLEPHRINE 40 MCG/ML (10ML) SYRINGE FOR IV PUSH (FOR BLOOD PRESSURE SUPPORT)
PREFILLED_SYRINGE | INTRAVENOUS | Status: AC
  Filled 2020-09-09: qty 10

## 2020-09-09 MED ORDER — LACTATED RINGERS IV SOLN: Freq: Once | INTRAVENOUS | Status: AC

## 2020-09-09 MED ORDER — EPHEDRINE 5 MG/ML INJ
INTRAVENOUS | Status: AC
  Filled 2020-09-09: qty 10

## 2020-09-09 MED ORDER — PROPOFOL 10 MG/ML IV BOLUS
INTRAVENOUS | Status: DC | PRN
  Administered 2020-09-09 (×2): 20 mg via INTRAVENOUS
  Administered 2020-09-09: 40 mg via INTRAVENOUS

## 2020-09-09 MED ORDER — LIDOCAINE HCL 1 % IJ SOLN
INTRAMUSCULAR | Status: DC | PRN
  Administered 2020-09-09: 10 mL via INTRADERMAL

## 2020-09-09 MED ORDER — GLYCOPYRROLATE 0.2 MG/ML IJ SOLN
INTRAMUSCULAR | Status: DC | PRN
  Administered 2020-09-09: .1 mg via INTRAVENOUS

## 2020-09-09 MED ORDER — CHLORHEXIDINE GLUCONATE 0.12 % MT SOLN
OROMUCOSAL | Status: AC
  Filled 2020-09-09: qty 15

## 2020-09-09 MED ORDER — CHLORHEXIDINE GLUCONATE 0.12 % MT SOLN
15.0000 mL | Freq: Once | OROMUCOSAL | Status: AC
  Administered 2020-09-09: 15 mL via OROMUCOSAL

## 2020-09-09 MED ORDER — DEXAMETHASONE SODIUM PHOSPHATE 10 MG/ML IJ SOLN
INTRAMUSCULAR | Status: AC
  Filled 2020-09-09: qty 1

## 2020-09-09 MED ORDER — ONDANSETRON HCL 4 MG/2ML IJ SOLN: 4.0000 mg | Freq: Once | INTRAMUSCULAR | Status: DC | PRN

## 2020-09-09 MED ORDER — BUPIVACAINE HCL (PF) 0.5 % IJ SOLN
INTRAMUSCULAR | Status: AC
  Filled 2020-09-09: qty 30

## 2020-09-09 MED ORDER — ONDANSETRON HCL 4 MG/2ML IJ SOLN
INTRAMUSCULAR | Status: DC | PRN
  Administered 2020-09-09: 4 mg via INTRAVENOUS

## 2020-09-09 MED ORDER — MIDAZOLAM HCL 2 MG/2ML IJ SOLN
INTRAMUSCULAR | Status: AC
  Filled 2020-09-09: qty 2

## 2020-09-09 MED ORDER — PROPOFOL 500 MG/50ML IV EMUL
INTRAVENOUS | Status: DC | PRN
  Administered 2020-09-09: 100 ug/kg/min via INTRAVENOUS

## 2020-09-09 MED ORDER — PROPOFOL 10 MG/ML IV BOLUS
INTRAVENOUS | Status: AC
  Filled 2020-09-09: qty 40

## 2020-09-09 MED ORDER — CHLORHEXIDINE GLUCONATE CLOTH 2 % EX PADS: 6.0000 | MEDICATED_PAD | Freq: Once | CUTANEOUS | Status: DC

## 2020-09-09 MED ORDER — SODIUM CHLORIDE 0.9 % IR SOLN
Status: DC | PRN
  Administered 2020-09-09: 1000 mL

## 2020-09-09 MED ORDER — LIDOCAINE HCL (PF) 1 % IJ SOLN
INTRAMUSCULAR | Status: AC
  Filled 2020-09-09: qty 30

## 2020-09-09 MED ORDER — ORAL CARE MOUTH RINSE: 15.0000 mL | Freq: Once | OROMUCOSAL | Status: AC

## 2020-09-09 MED ORDER — PROPOFOL 10 MG/ML IV BOLUS
INTRAVENOUS | Status: AC
  Filled 2020-09-09: qty 20

## 2020-09-09 SURGICAL SUPPLY — 55 items
APL PRP STRL LF DISP 70% ISPRP (MISCELLANEOUS) ×1
APL SKNCLS STERI-STRIP NONHPOA (GAUZE/BANDAGES/DRESSINGS) ×1
BANDAGE ELASTIC 4 VELCRO NS (GAUZE/BANDAGES/DRESSINGS) ×2 IMPLANT
BANDAGE ESMARK 4X12 BL STRL LF (DISPOSABLE) ×1 IMPLANT
BENZOIN TINCTURE PRP APPL 2/3 (GAUZE/BANDAGES/DRESSINGS) ×2 IMPLANT
BIT DRILL MINI LNG ACUTRAK 2 (BIT) ×1 IMPLANT
BLADE AVERAGE 25X9 (BLADE) ×2 IMPLANT
BLADE SURG 15 STRL LF DISP TIS (BLADE) ×2 IMPLANT
BLADE SURG 15 STRL SS (BLADE) ×4
BNDG CMPR 12X4 ELC STRL LF (DISPOSABLE) ×1
BNDG CMPR STD VLCR NS LF 5.8X4 (GAUZE/BANDAGES/DRESSINGS) ×1
BNDG CONFORM 2 STRL LF (GAUZE/BANDAGES/DRESSINGS) ×2 IMPLANT
BNDG ELASTIC 4X5.8 VLCR NS LF (GAUZE/BANDAGES/DRESSINGS) ×2 IMPLANT
BNDG ESMARK 4X12 BLUE STRL LF (DISPOSABLE) ×2
BNDG GAUZE ELAST 4 BULKY (GAUZE/BANDAGES/DRESSINGS) ×2 IMPLANT
BOOT STEPPER DURA SM (SOFTGOODS) ×2 IMPLANT
CHLORAPREP W/TINT 26 (MISCELLANEOUS) ×2 IMPLANT
CLOSURE STERI-STRIP 1/4X4 (GAUZE/BANDAGES/DRESSINGS) ×2 IMPLANT
CLOTH BEACON ORANGE TIMEOUT ST (SAFETY) ×2 IMPLANT
COVER LIGHT HANDLE STERIS (MISCELLANEOUS) ×4 IMPLANT
COVER WAND RF STERILE (DRAPES) ×2 IMPLANT
CUFF TOURN SGL QUICK 18X4 (TOURNIQUET CUFF) IMPLANT
DECANTER SPIKE VIAL GLASS SM (MISCELLANEOUS) ×2 IMPLANT
DRAPE OEC MINIVIEW 54X84 (DRAPES) ×2 IMPLANT
DRILL MINI LNG ACUTRAK 2 (BIT) ×2
DRSG ADAPTIC 3X8 NADH LF (GAUZE/BANDAGES/DRESSINGS) ×2 IMPLANT
ELECT REM PT RETURN 9FT ADLT (ELECTROSURGICAL) ×2
ELECTRODE REM PT RTRN 9FT ADLT (ELECTROSURGICAL) ×1 IMPLANT
GAUZE KERLIX 2X3 DERM STRL LF (GAUZE/BANDAGES/DRESSINGS) ×2 IMPLANT
GAUZE SPONGE 4X4 12PLY STRL (GAUZE/BANDAGES/DRESSINGS) ×2 IMPLANT
GLOVE BIO SURGEON STRL SZ7.5 (GLOVE) ×2 IMPLANT
GLOVE BIOGEL PI IND STRL 7.0 (GLOVE) ×1 IMPLANT
GLOVE BIOGEL PI IND STRL 7.5 (GLOVE) ×2 IMPLANT
GLOVE BIOGEL PI INDICATOR 7.0 (GLOVE) ×1
GLOVE BIOGEL PI INDICATOR 7.5 (GLOVE) ×2
GLOVE ECLIPSE 7.0 STRL STRAW (GLOVE) ×4 IMPLANT
GOWN STRL REUS W/ TWL LRG LVL3 (GOWN DISPOSABLE) ×1 IMPLANT
GOWN STRL REUS W/TWL LRG LVL3 (GOWN DISPOSABLE) ×6 IMPLANT
GUIDEWIRE ORTHO MINI ACTK .045 (WIRE) ×2 IMPLANT
KIT TURNOVER KIT A (KITS) ×2 IMPLANT
MANIFOLD NEPTUNE II (INSTRUMENTS) ×2 IMPLANT
NEEDLE HYPO 25X1 1.5 SAFETY (NEEDLE) ×10 IMPLANT
NS IRRIG 1000ML POUR BTL (IV SOLUTION) ×2 IMPLANT
PACK BASIC LIMB (CUSTOM PROCEDURE TRAY) ×2 IMPLANT
PAD ARMBOARD 7.5X6 YLW CONV (MISCELLANEOUS) ×2 IMPLANT
RASP SM TEAR CROSS CUT (RASP) ×2 IMPLANT
SCREW ACUTRAK 2 MINI 18MM (Screw) ×2 IMPLANT
SET BASIN LINEN APH (SET/KITS/TRAYS/PACK) ×2 IMPLANT
STRIP CLOSURE SKIN 1/2X4 (GAUZE/BANDAGES/DRESSINGS) ×2 IMPLANT
SUT PROLENE 4 0 PS 2 18 (SUTURE) ×4 IMPLANT
SUT VIC AB 2-0 CT2 27 (SUTURE) ×2 IMPLANT
SUT VIC AB 4-0 PS2 27 (SUTURE) ×2 IMPLANT
SUT VICRYL AB 3-0 FS1 BRD 27IN (SUTURE) ×2 IMPLANT
SYR BULB IRRIG 60ML STRL (SYRINGE) ×2 IMPLANT
SYR CONTROL 10ML LL (SYRINGE) ×4 IMPLANT

## 2020-09-09 NOTE — Transfer of Care (Signed)
Immediate Anesthesia Transfer of Care Note  Patient: Casey Weber  Procedure(s) Performed: Serafina Royals RIGHT FOOT (Right Toe)  Patient Location: PACU  Anesthesia Type:General  Level of Consciousness: awake, alert , oriented and patient cooperative  Airway & Oxygen Therapy: Patient Spontanous Breathing  Post-op Assessment: Report given to RN  Post vital signs: Reviewed and stable  Last Vitals:  Vitals Value Taken Time  BP 119/60 09/09/20 1411  Temp    Pulse    Resp 16 09/09/20 1413  SpO2    Vitals shown include unvalidated device data.  Last Pain: There were no vitals filed for this visit.       Complications: No complications documented.

## 2020-09-09 NOTE — Brief Op Note (Signed)
09/09/2020  2:03 PM  PATIENT:  Cipriano Mile  69 y.o. female  PRE-OPERATIVE DIAGNOSIS:  HALLUX VALGUS RIGHT FOOT  POST-OPERATIVE DIAGNOSIS:  HALLUX VALGUS RIGHT FOOT  PROCEDURE:  Procedure(s): [place] BUNIONECTOMY RIGHT FOOT (Right)  SURGEON:  Surgeon(s) and Role:    * Tyson Babinski, DPM - Primary  ASSISTANTS: None.  ANESTHESIA:  Local with MAC  EBL:  None.  BLOOD ADMINISTERED:none  DRAINS: none   LOCAL MEDICATIONS USED:  MARCAINE   , LIDOCAINE  and Amount: 18 ml  SPECIMEN:  No Specimen  DISPOSITION OF SPECIMEN:  N/A  COUNTS:  YES  TOURNIQUET:   Total Tourniquet Time Documented: Calf (Right) - 54 minutes Total: Calf (Right) - 54 minutes   DICTATION: .Viviann Spare Dictation  PLAN OF CARE: Discharge to home after PACU  PATIENT DISPOSITION:  PACU - hemodynamically stable.   Delay start of Pharmacological VTE agent (>24hrs) due to surgical blood loss or risk of bleeding: not applicable

## 2020-09-09 NOTE — Anesthesia Preprocedure Evaluation (Signed)
Anesthesia Evaluation  Patient identified by MRN, date of birth, ID band Patient awake    Reviewed: Allergy & Precautions, NPO status , Patient's Chart, lab work & pertinent test results  History of Anesthesia Complications Negative for: history of anesthetic complications  Airway Mallampati: II  TM Distance: >3 FB Neck ROM: Full   Comment: Cervical spine sx Dental  (+) Edentulous Upper, Edentulous Lower   Pulmonary former smoker,    Pulmonary exam normal breath sounds clear to auscultation       Cardiovascular negative cardio ROS Normal cardiovascular exam Rhythm:Regular Rate:Normal     Neuro/Psych Anxiety CVA, No Residual Symptoms    GI/Hepatic Neg liver ROS, GERD  ,  Endo/Other  negative endocrine ROS  Renal/GU negative Renal ROS     Musculoskeletal Cervical spine sx   Abdominal   Peds  Hematology negative hematology ROS (+)   Anesthesia Other Findings   Reproductive/Obstetrics negative OB ROS                             Anesthesia Physical Anesthesia Plan  ASA: III  Anesthesia Plan: General   Post-op Pain Management:    Induction: Intravenous  PONV Risk Score and Plan: TIVA and Ondansetron  Airway Management Planned: Natural Airway, Nasal Cannula and Simple Face Mask  Additional Equipment:   Intra-op Plan:   Post-operative Plan:   Informed Consent: I have reviewed the patients History and Physical, chart, labs and discussed the procedure including the risks, benefits and alternatives for the proposed anesthesia with the patient or authorized representative who has indicated his/her understanding and acceptance.       Plan Discussed with: CRNA and Surgeon  Anesthesia Plan Comments:         Anesthesia Quick Evaluation

## 2020-09-09 NOTE — Discharge Instructions (Signed)
Bunion Surgery, Care After This sheet gives you information about how to care for yourself after your procedure. Your health care provider may also give you more specific instructions. If you have problems or questions, contact your health care provider. What can I expect after the procedure? After the procedure, it is common to have:  Pain.  Swelling.  A small amount of fluid coming from your incision. Follow these instructions at home: If you have a post-operative brace, boot, or shoe:  Wear the post-op (post-operative) brace, boot, or shoe as told by your health care provider. Remove it only as told by your health care provider.  Loosen the brace, boot, or shoe if your toes tingle, become numb, or turn cold and blue.  Keep the brace, boot, or shoe clean and dry. If you have a cast:  Do not stick anything inside the cast to scratch your skin. Doing that increases your risk of infection.  Check the skin around the cast every day. Tell your health care provider about any concerns.  You may put lotion on dry skin around the edges of the cast. Do not put lotion on the skin underneath the cast.  Keep the cast clean and dry. Bathing  Do not take baths, swim, or use a hot tub until your health care provider approves. Ask your health care provider if you may take showers.  If your brace, boot, shoe, or cast is not waterproof: ? Do not let it get wet. ? Cover it with a watertight covering when you take a bath or a shower.  Keep your bandage (dressing) dry until your health care provider says it can be removed. Incision care   The dressing holds your toe in the correct position. Do not change the dressing until your health care provider approves.  Follow instructions from your health care provider about how to take care of your incision. Make sure you: ? Wash your hands with soap and water before you change your dressing. If soap and water are not available, use hand  sanitizer. ? Change your dressing as told by your health care provider. ? Leave stitches (sutures), skin glue, or adhesive strips in place. These skin closures may need to stay in place for 2 weeks or longer. If adhesive strip edges start to loosen and curl up, you may trim the loose edges. Do not remove adhesive strips completely unless your health care provider tells you to do that.  Check your incision area every day for signs of infection. Check for: ? More redness, swelling, or pain. ? Blood or more fluid. ? Warmth. ? Pus or a bad smell. Managing pain, stiffness, and swelling   If directed, put ice on the affected area. ? If you have a removable brace, boot, or shoe, remove it as told by your health care provider. ? Put ice in a plastic bag. ? Place a towel between your skin and the bag or between your cast and the bag. ? Leave the ice on for 20 minutes, 2-3 times a day.  Move your toes often to avoid stiffness and to lessen swelling.  Raise (elevate) your foot above the level of your heart while you are sitting or lying down. Driving  Do not drive for 24 hours if you were given a medicine to help you relax (sedative) during your procedure.  Do not drive or use heavy machinery while taking prescription pain medicine.  Ask your health care provider when it is safe to drive  if you have a brace, boot, shoe, or cast on your foot. Activity  Return to your normal activities as told by your health care provider. Ask your health care provider what activities are safe for you.  If physical therapy was prescribed, do exercises as told by your health care provider. Safety  Do not use the affected leg to support (bear) your body weight until your health care provider says that you can. Follow weight-bearing restrictions as told. Use crutches, a cane, or a Vivar as told by your health care provider. General instructions  Do not use any products that contain nicotine or tobacco, such as  cigarettes and e-cigarettes. These can delay bone healing. If you need help quitting, ask your health care provider.  Take over-the-counter and prescription medicines only as told by your health care provider.  If you are taking prescription pain medicine, take actions to prevent or treat constipation. Your health care provider may recommend that you: ? Drink enough fluid to keep your urine pale yellow. ? Eat foods that are high in fiber, such as fresh fruits and vegetables, whole grains, and beans. ? Limit foods that are high in fat and processed sugars, such as fried or sweet foods. ? Take an over-the-counter or prescription medicine for constipation.  Do not wear high heels or tight-fitting shoes, even after you heal.  Keep all follow-up visits as told by your health care provider. This is important. Contact a health care provider if:  You have more redness, swelling, or pain around your incision.  You have more fluid or blood coming from the incision.  Your incision feels warm to the touch.  There is pus or a bad smell coming from your incision.  You have a fever or chills.  Your dressing gets wet or it falls off.  You have swelling in your lower leg.  You have numbness or stiffness in your toes. Get help right away if:  You have a rash.  You have difficulty breathing. Summary  Do not use the affected leg to support (bear) your body weight until your health care provider says that you can. Follow weight-bearing restrictions as told. Use crutches, a cane, or a Vasseur as told by your health care provider.  If directed, put ice on the affected area. Leave the ice on for 20 minutes, 2-3 times a day.  Do not drive or use heavy machinery while taking prescription pain medicine. This information is not intended to replace advice given to you by your health care provider. Make sure you discuss any questions you have with your health care provider. Document Revised: 09/01/2017  Document Reviewed: 06/26/2017 Elsevier Patient Education  [employer].  .These instructions will give you an idea of what to expect after surgery and how to manage issues that may arise before your first post op office visit.  Pain Management Pain is best managed by "staying ahead" of it. If pain gets out of control, it is difficult to get it back under control. Local anesthesia that lasts 6-8 hours is used to numb the foot and decrease pain.  For the best pain control, take the pain medication every 4 hours for the first 2 days post op. On the third day pain medication can be taken as needed.   Post Op Nausea Nausea is common after surgery, so it is managed proactively.  If prescribed, use the prescribed nausea medication regularly for the first 2 days post op.  Bandages Do not worry if  there is blood on the bandage. What looks like a lot of blood on the bandage is actually a small amount. Blood on the dressing spreads out as it is absorbed by the gauze, the same way a drop of water spreads out on a paper towel.  If the bandages feel wet or dry, stiff and uncomfortable, call the office during office hours and we will schedule a time for you to have the bandage changed.  Unless you are specifically told otherwise, we will do the first bandage change in the office.  Keep your bandage dry. If the bandage becomes wet or soiled, notify the office and we will schedule a time to change the bandage.  Activity It is best to spend most of the first 2 days after surgery lying down with the foot elevated above the level of your heart. You may put weight on your heel while wearing the surgical shoe.   You may only get up to go to the restroom.  Driving Do not drive until you are able to respond in an emergency (i.e. slam on the brakes). This usually occurs after the bone has healed - 6 to 8 weeks.  Call the Office If you have a fever over 101F.  If you have increasing pain after the initial  post op pain has settled down.  If you have increasing redness, swelling, or drainage.  If you have any questions or concerns.

## 2020-09-09 NOTE — H&P (Signed)
Casey Weber is an 69 y.o. female.   Chief Complaint: Bunion right foot.  HPI:   Past Medical History:  Diagnosis Date  . Anxiety   . GERD (gastroesophageal reflux disease)   . Hyperlipidemia   . Insomnia   . Insomnia   . Osteoporosis   . Stroke (HCC) 1993  . Thyroid disease     Past Surgical History:  Procedure Laterality Date  . ABDOMINAL HYSTERECTOMY    . CERVICAL SPINE SURGERY    . CHOLECYSTECTOMY  2015  . FACIAL FRACTURE SURGERY    . TONSILLECTOMY      Family History  Problem Relation Age of Onset  . Heart disease Mother   . Other Father        unknown  . Emphysema Brother   . Heart disease Maternal Grandfather   . Colon cancer Neg Hx   . Liver cancer Neg Hx   . Stomach cancer Neg Hx    Social History:  reports that she has quit smoking. She has never used smokeless tobacco. She reports that she does not drink alcohol and does not use drugs.  Allergies:  Allergies  Allergen Reactions  . Aspirin Nausea And Vomiting  . Codeine Nausea And Vomiting  . Oxycontin [Oxycodone] Nausea And Vomiting  . Tequin [Gatifloxacin] Nausea And Vomiting    Facility-Administered Medications Prior to Admission  Medication Dose Route Frequency Provider Last Rate Last Admin  . 0.9 %  sodium chloride infusion  500 mL Intravenous Once Meryl Dare, MD       Medications Prior to Admission  Medication Sig Dispense Refill  . amitriptyline (ELAVIL) 50 MG tablet Take 100 mg by mouth at bedtime.    . cholecalciferol (VITAMIN D) 25 MCG (1000 UNIT) tablet Take 1,000 Units by mouth daily.    Marland Kitchen denosumab (PROLIA) 60 MG/ML SOLN injection Inject 60 mg into the skin every 6 (six) months. Administer in upper arm, thigh, or abdomen    . donepezil (ARICEPT ODT) 5 MG disintegrating tablet Take 5 mg by mouth at bedtime.    Marland Kitchen esomeprazole (NEXIUM) 40 MG capsule Take 1 capsule (40 mg total) by mouth 2 (two) times daily before a meal. (Patient taking differently: Take 40 mg by mouth daily. ) 60  capsule 11  . ferrous sulfate 325 (65 FE) MG tablet Take 325 mg by mouth daily with breakfast.    . levocetirizine (XYZAL) 5 MG tablet Take 5 mg by mouth every evening.    Marland Kitchen levothyroxine (SYNTHROID, LEVOTHROID) 25 MCG tablet Take 25 mcg by mouth daily before breakfast.    . loratadine (CLARITIN) 10 MG tablet Take 10 mg by mouth at bedtime.    Marland Kitchen LORazepam (ATIVAN) 1 MG tablet Take 1 mg by mouth at bedtime.     . montelukast (SINGULAIR) 10 MG tablet Take 10 mg by mouth at bedtime.    . ondansetron (ZOFRAN) 4 MG tablet Take 1 tablet (4 mg total) by mouth every 8 (eight) hours as needed for nausea or vomiting. 20 tablet 0  . sertraline (ZOLOFT) 50 MG tablet Take 50 mg by mouth at bedtime.     . simvastatin (ZOCOR) 40 MG tablet Take 20 mg by mouth at bedtime.     . topiramate (TOPAMAX) 25 MG tablet Take 50 mg by mouth in the morning and at bedtime.    . verapamil (CALAN-SR) 120 MG CR tablet Take 120 mg by mouth at bedtime.      Results for orders placed or  performed during the hospital encounter of 09/07/20 (from the past 48 hour(s))  SARS CORONAVIRUS 2 (TAT 6-24 HRS) Nasopharyngeal Nasopharyngeal Swab     Status: None   Collection Time: 09/07/20 12:40 PM   Specimen: Nasopharyngeal Swab  Result Value Ref Range   SARS Coronavirus 2 NEGATIVE NEGATIVE    Comment: (NOTE) SARS-CoV-2 target nucleic acids are NOT DETECTED.  The SARS-CoV-2 RNA is generally detectable in upper and lower respiratory specimens during the acute phase of infection. Negative results do not preclude SARS-CoV-2 infection, do not rule out co-infections with other pathogens, and should not be used as the sole basis for treatment or other patient management decisions. Negative results must be combined with clinical observations, patient history, and epidemiological information. The expected result is Negative.  Fact Sheet for Patients: HairSlick.no  Fact Sheet for Healthcare  Providers: quierodirigir.com  This test is not yet approved or cleared by the [place] FDA and  has been authorized for detection and/or diagnosis of SARS-CoV-2 by FDA under an Emergency Use Authorization (EUA). This EUA will remain  in effect (meaning this test can be used) for the duration of the COVID-19 declaration under Se ction 564(b)(1) of the Act, 21 U.S.C. section 360bbb-3(b)(1), unless the authorization is terminated or revoked sooner.  Performed at [facility], [address], [place], [place] [address]   CBC with Differential/Platelet     Status: None   Collection Time: 09/07/20 12:40 PM  Result Value Ref Range   WBC 5.9 4.0 - 10.5 K/uL   RBC 4.31 3.87 - 5.11 MIL/uL   Hemoglobin 12.4 12.0 - 15.0 g/dL   HCT 85.2 36 - 46 %   MCV 89.8 80.0 - 100.0 fL   MCH 28.8 26.0 - 34.0 pg   MCHC 32.0 30.0 - 36.0 g/dL   RDW 77.8 24.2 - 35.3 %   Platelets 161 150 - 400 K/uL   nRBC 0.0 0.0 - 0.2 %   Neutrophils Relative % 74 %   Neutro Abs 4.4 1.7 - 7.7 K/uL   Lymphocytes Relative 14 %   Lymphs Abs 0.8 0.7 - 4.0 K/uL   Monocytes Relative 7 %   Monocytes Absolute 0.4 0.1 - 1.0 K/uL   Eosinophils Relative 3 %   Eosinophils Absolute 0.2 0.0 - 0.5 K/uL   Basophils Relative 1 %   Basophils Absolute 0.0 0.0 - 0.1 K/uL   Immature Granulocytes 1 %   Abs Immature Granulocytes 0.03 0.00 - 0.07 K/uL    Comment: Performed at [facility], [address] [place], [place] [address]  Basic metabolic panel     Status: Abnormal   Collection Time: 09/07/20 12:40 PM  Result Value Ref Range   Sodium 136 135 - 145 mmol/L   Potassium 3.8 3.5 - 5.1 mmol/L   Chloride 105 98 - 111 mmol/L   CO2 22 22 - 32 mmol/L   Glucose, Bld 101 (H) 70 - 99 mg/dL    Comment: Glucose reference range applies only to samples taken after fasting for at least 8 hours.   BUN 12 8 - 23 mg/dL   Creatinine, Ser 1.54 (H) 0.44 - 1.00 mg/dL   Calcium 9.1 8.9 - 00.8 mg/dL   GFR, Estimated  53 (L) >60 mL/min    Comment: (NOTE) Calculated using the CKD-EPI Creatinine Equation (2021)    Anion gap 9 5 - 15    Comment: Performed at [facility], [address], [place], [place] [address]   DG Foot  Complete Right  Result Date: 09/08/2020 CLINICAL DATA:  Chronic diffuse right foot pain. EXAM: RIGHT FOOT COMPLETE - 3+ VIEW COMPARISON:  None. FINDINGS: No acute fracture or dislocation. Joint spaces are preserved. Mild hallux valgus deformity with associated bunion. Bone mineralization is normal. Soft tissues are unremarkable. IMPRESSION: 1. Mild hallux valgus deformity. Electronically Signed   By: Obie Dredge M.D.   On: 09/08/2020 08:45    Review of Systems  There were no vitals taken for this visit. Physical Exam Right Foot GENERAL:  Alert, NAD. EQUIPMENT:  [None.] INTEGUMENT:  [Skin of both feet is warm and dry.]  [No hyperkeratotic lesions are present.]  [No ulcerations are present.]  [Pedal hair growth is present bilaterally.]  [Nails are well trimmed bilaterally.] VASCULAR:  [DP is palpable bilaterally.]  [PT is palpable bilaterally.]  [No edema is present.]  MUSCULOSKELETAL:  [Muscle tone is normal bilaterally.]  [Muscle strength is 5 out of 5 with regards to dorsiflexion, plantarflexion, inversion and eversion of both feet.] There is prominent medial bump noted at the right first metatarsal with pain and swelling. There is skin redness noted at the medial aspect of the right first MPJ.  Range of motion is within normal limit and pain free.  NEUROLOGIC:  [Light touch sensation is grossly intact bilaterally.]  Assessment/Plan Hallux valgus right foot. Right toe pain.   Plan: The clinical findings, radiographic findings, assessment and plan were explained. Husband is present in the room.  Medical clearance notes reviewed.  I discussed the surgery in Layman's term.  All the risk and benefits were explained to the patient including delayed union, nonunion, recurrence,  swelling, infection, painful hardware, necrosis of toe, neuritis.  All her questions were answered.  No guarantees were given to patient for the outcome of the procedure.  Informed consent was signed and witnessed. Post op pain medication and post op protocol was explained to patient.  Post op appointment was given to patient.  Erskine Emery, DPM 09/09/2020, 12:35 PM

## 2020-09-09 NOTE — Op Note (Signed)
09/09/2020  2:03 PM  PATIENT:  Casey Weber  69 y.o. female  PRE-OPERATIVE DIAGNOSIS:  HALLUX VALGUS RIGHT FOOT  POST-OPERATIVE DIAGNOSIS:  HALLUX VALGUS RIGHT FOOT  PROCEDURE:  Procedure(s): [place] BUNIONECTOMY RIGHT FOOT (Right)  SURGEON:  Surgeon(s) and Role:    * Casey Weber, DPM - Primary  ASSISTANTS: None.  ANESTHESIA:  Local with MAC  EBL:  None.  BLOOD ADMINISTERED:none  Materials: Accumed Macro screw 33m. 2-0 Vicryl, 4-0 Vicryl, 4-0 nylon.  LOCAL MEDICATIONS USED:  MARCAINE, LIDOCAINE  and Amount: 18 ml  TOURNIQUET:   Total Tourniquet Time Documented: Calf (Right) - 54 minutes Total: Calf (Right) - 54 minutes  Patient was brought into the operating room laid supine on the operating table. Ankle tourniquet was applied to the surgical extremity. Following IV sedation, a local block was achieved using 10 cc of mixture of 1% plain lidocaine with 0.5% marcaine. The foot was the prepped, scrubbed and draped in aseptic manner. Using an esmarch band the tourniquet on the surgical site was inflatted at 2563mG.   Attention was directed to the dorsomedial aspect of the right first metatarsophalangeal joint where a 6-cm linear incision was made medial and parallel to the course of the extensor hallucis longus tendon.  The incision was deepened through subcutaneous tissues.  Vital neurovascular structures were identified and retracted.  All bleeders were identified and cauterized.  The incision was deepened to the level of the first metatarsophalangeal joint capsule.  An inverted L-type capsulotomy was performed over the dorsal aspect of the first metatarsophalangeal joint.  The capsular and periosteal structures were dissected free of their osseous attachments and reflected medially and laterally thus exposing the head of the first metatarsal at the operative site.  Utilizing a sagittal saw, the medial prominence was resected and passed from the operative field.  All rough  edges were smoothed.    At this time a guide was used to position the osteotomy in medial aspect of the first metatarsal head. Fluoroscopy was used to see the position of the wire. A through and through v-shape osteotomy was created in the metaphyseal region of the first metatarsal bone using a sagittal bone saw.  The apex of the osteotomy pointed distally.  Upon completion of the osteotomy the capital fragment was distracted and shifted laterally into a more corrected position.  A k-wire from the Acumed cannulated screw set was inserted across the osteotomy site from a dorsal proximal to plantar distal direction.  An Acumed cannulated screw was inserted across the k-wire.  Adequate compression was noted.  The position of the screw was confirmed with fluoroscopy and noted to be in acceptable alignment.    The remaining medial bone shelf was resected utilizing a sagittal bone saw and passed from the operative field.  The area was copiously irrigated. The periosteal and capsular tissues were approximated with 2-0 Vicryl suture.  4-0 Vicryl was used to approximate the subcutaneous tissues. 4-0 Nylon was used to approximate the skin.  Additional local block was given using 8cc of mixture of 1% plain lidocaine with 0.5% marcaine. Dry sterile dressing applied. Tourniquet was deflated. Capillary fill time was brisk to toes.   Patient was transferred to PACU with vitals stable. Casey Weber was placed on the right side. Patient will be partial weightbearing in the Casey Casey Weber.

## 2020-09-09 NOTE — Anesthesia Postprocedure Evaluation (Signed)
Anesthesia Post Note  Patient: Casey Weber  Procedure(s) Performed: Serafina Royals RIGHT FOOT (Right Toe)  Patient location during evaluation: PACU Anesthesia Type: General Level of consciousness: awake and alert and oriented Pain management: pain level controlled Vital Signs Assessment: post-procedure vital signs reviewed and stable Respiratory status: spontaneous breathing and respiratory function stable Cardiovascular status: blood pressure returned to baseline and stable Postop Assessment: no apparent nausea or vomiting Anesthetic complications: no   No complications documented.   Last Vitals:  Vitals:   09/09/20 1438 09/09/20 1445  BP: 137/67 140/60  Pulse: 68   Resp: 16 17  Temp:    SpO2: 100% 97%    Last Pain:  Vitals:   09/09/20 1445  PainSc: 0-No pain                 Tarquin Welcher C Julus Kelley

## 2020-09-10 ENCOUNTER — Encounter (HOSPITAL_COMMUNITY): Payer: Self-pay | Admitting: Podiatry

## 2020-11-19 ENCOUNTER — Other Ambulatory Visit: Payer: Self-pay | Admitting: Physician Assistant

## 2020-12-16 ENCOUNTER — Ambulatory Visit: Payer: Medicare Other | Attending: Gastroenterology

## 2020-12-16 ENCOUNTER — Encounter: Payer: Self-pay | Admitting: Gastroenterology

## 2020-12-16 NOTE — Pre-Procedure Instructions (Signed)
Surgical Risk Level: low    Surgeon Testing Requirements: na    H&P: dos    Anesthesia Guideline Requirements:na    Specialist Notes/Test Results/Records Requested:    Cardiology note in media. 09/04/21    Recent Hospitalization/ED Visit: na    Future Plan/Appointments: na    Labs/Testing @ [facility]: na    Email sent to patient: declined email for map or address     E-Fax sent to pharmacy:  na    NPO Instructions given to patient:    o NPO instructions reviewed: Clear liquids up to 2 hours prior to arrival time, then NPO. No solid food 8 hours prior to scheduled procedure time. Examples of clear liquids include water, apple juice, sports drinks such as Gatorade, coffee or tea without milk or cream. Sugar or sweetener may be added  o Fasting Requirements per Preoperative Fasting Guidelines for Elective Surgeries and Procedures Requiring Anesthesia Policy Revised 02/2020.  Ingested material Fasting requirement   Clear liquids/Ice Chips 2 hours prior to arrival time   Breast milk 4 hours prior to scheduled procedure time   Infant formula 6 hours prior to scheduled procedure time   Non-human milk 8 hours prior to scheduled procedure time   Solid food 8 hours prior to scheduled procedure time     o Bowel prep instructions (if applicable): follow doctor's instructions    Epic Orders Entered:   o Preop Nursing Anesthesia Orders      Other Outlying information gathered that does not fit anywhere else: na    Chart Room Handoff for Further  Follow-up if Applicable: see above if applicable      Visitor Restriction Guidelines per [facility] as of 04/20/20:  All visitors must adhere to the following:    Exhibit no COVID-19 symptoms    Age 70+ (internally exceptions can be made by administrative team as needed, e.g., siblings, end of life situations, etc.)    In keeping with the CDCs current guidance, regardless of vaccination status, everyone in a healthcare facility must wear a mask covering their mouth and nose the  entire time they are in the facility. Visitors who fail to wear a mask properly will be asked to leave.    The following face coverings cannot be worn at any [place] location: gaiter style masks, bandanas or vented masks.    No visitors are allowed for patients with suspected or confirmed COVID-19, except in end-of-life situations.  Hospital Inpatient:   Visitation hours: 9 a.m. - 6:30 p.m. daily    Adult patients may have two visitors, in addition to a [employer] Person (DSP), if applicable    Pediatric patients may have two parents/guardians at bedside 24/7. For pediatric outpatient areas, only parents/guardians may visit  Outpatient/Ambulatory Surgery:    Adult patients may have one visitor, in addition to their Designated Support Person (DSP) on the day of surgery.    No family members will be allowed into Phase 1 recovery areas. Physicians will call contact person to give report of procedure.    Family / visitor will be called by PACU staff  to review discharge instructions via phone and answer any questions.     Advise to call surgeon if need to cancel surgery arises.      Patient verbalized understanding and acceptance of above information.

## 2020-12-21 NOTE — Progress Notes (Signed)
[facility] Breast Cancer Follow Up    Treatment Team  Ref Prov:  Primary Care Physician:  Raj Janus, MD Radiology facility:[facility])--[phone] Breast Surgeon :Henderson [place] MD  [phone]   Plastic Surgeonnone Radiation Onc:  Miguel Dibble MD: [phone] [facility] [place]); Medical Onc:  Laney Pastor, MD [phone] [facility] [place])      Breast Cancer Comprehensive Care Plan Summary  Date of diagnosis: 11/03/2018 Event : DCIS ECOG Performance:Grade 0 (Fully active) Genetic Testing:not indicated   Presentation :Abnormal mammogram with microcalcifications Side: left Focality: unifocal Location:radian 12:00   Biologic Tumor characteristics  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 - not indicated Oncotype Dx:  DCIS , not indicated   Estrogen Receptor: positive 19 % Progesterone Receptor: negative Her-2-neu Receptor: DCIS, not indicated Androgen Receptor;  not done   Staging  Tumor Size:   DCIS with <56mm microinvasion Nodes: clinically/US negative Systemic Metastases: clinically negative Stage: Stage0 , Tmic N0 M0   Treatment  Modality Date    Surgery   12/25/18    01/15/2019 left, partial mastectomy with needle localization    Re-excision lumpectomy    Radiation  Whole breast radiation completed on 04/02/2019   Endocrine  not felt to be warranted as the patient was only weakly ER positive   Chemotherapy     Biologic     Clinical trial         HPI   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 female patient here for follow up of her history of left breast cancer originally diagnosed in February 2020.  She is currently on No endocrine therapy    The patient is asymptomatic and denies any persistent breast pain , palpable masses, skin or nipple changes or nipple discharge.     Recent Imaging:  LDM 07/18/19: No mammographic evidence for malignancy. BIRADS2    BDM 11/06/19: Left  lumpectomy changes with No mammographic evidence for malignancy. BIRADS2    BDM 11/06/20: No mammographic evidence for malignancy. BIRADS2    The following portions of the patient's history were reviewed and updated as appropriate: allergies, current medications, past family history, past medical history, past social history, past surgical history and problem list.     has a past medical history of Anxiety, Arrhythmia, Bilateral cataracts, Chronic kidney disease, CKD (chronic kidney disease), stage III, Diabetes mellitus, type 2, Encounter for blood transfusion (2003), Exocrine pancreatic insufficiency, Gastroesophageal reflux disease, Hemorrhagic diathesis (06/2002), Hyperlipidemia, Hypertension, Malignant neoplasm of breast (2020), PAN (polyarteritis nodosa) (2003), and Sleep apnea.    Family History     Family History   Problem Relation Age of Onset   . Diabetes Mother    . Thyroid disease Mother    . No known problems Father         Review of Systems   See scan.     Physical Exam   BP 136/76 (BP Site: Left arm, Patient Position: Sitting)   Pulse 70   Temp 98 F (36.7 C) (Temporal)   Resp 16   Ht 1.626 m (5\' 4" )   Wt 68.5 kg (151 lb)   SpO2 98%   BMI 25.92 kg/m     WDWN female in NAD  HEENT:  Clear, no scleral icterus, neck supple  Neck:  No thyromegaly or masses, trachea midline  Chest:  Respiratory effort normal  BJM:  Gait and station normal  Ext:  No CCE  Skin:  Free of significant ulcers or lesions  Neuro:  Grossly intact, no focal findings, alert and oriented, asks appropriate questions  LN:  No axillary, supraclavicular or cervical adenopathy  Breast:  Symmetric .    Right breast: No skin or nipple changes, no nipple retraction or discharge . No palpable masses    Left breast : No skin or nipple changes, no nipple retraction or discharge . No palpable masses. Well healed incision.    Rads   Radiological imaging and reports reviewed as above.     Assessment/Plan   1. Personal history of breast  carcinoma    Patient is doing well. She currently has no evidence of disease .  Her physical and emotional recovery is good. Cosmetic outcome is good .  I have recommended continued treatment with Serial imaging    Surveillance / Folllow up recommendations:    I reviewed the importance of breast exams and follow up at six month intervals for the first five years after the first treatment; alternating exams at 6 month intervals with medical oncology is a good option between years three and five to limit the number of times needed for follow up ; and every year thereafter.  I have recommended long term follow-up because there is a low but measurable risk of breast cancer recurrence even more than 10 years after treatment. In addition, if  there is remaining breast tissue, there is a higher risk to develop a new, independent, breast cancer at some time in the future.    After lumpectomy, we recommend mammograms once a year. The purpose is to monitor for local recurrence and detection of new primary.    Patient is advised to report these symptoms : new lumps, bone pain, chest pain, shortness of breath or difficulty breathing, abdominal pain, or persistent headaches.     The following tests are not recommended for routine breast cancer follow-up: breast MRI, FDG-PET scans, complete blood cell counts, automated chemistry studies, chest x-rays, bone scans, liver ultrasound, and tumor markers (CA 15-3, CA 27.29, CEA).     Nutrition  I have also discussed life-style choices to decrease risk of breast cancer such as:   Healthy eating habits, meals high in fruits, vegetables, nuts and complex carbohydrates, low in animal fats.  Limit the amount of alcohol ingested   Avoid  Hormone Replacement Therapy.    Exercise  Continue to exercise, aerobic 30 - 40 minutes , 3 -4 times / week    Follow up  Continue healthy lifestyle.   Bilateral mammogram in February 2023  Follow up visit with me  In 12 months, sooner if concerns.   Follow  up with PCP, GI  Patient agrees with plan.

## 2020-12-22 ENCOUNTER — Encounter (HOSPITAL_BASED_OUTPATIENT_CLINIC_OR_DEPARTMENT_OTHER): Payer: Self-pay | Admitting: Family Nurse Practitioner

## 2020-12-22 ENCOUNTER — Ambulatory Visit (INDEPENDENT_AMBULATORY_CARE_PROVIDER_SITE_OTHER): Payer: Medicare Other | Admitting: Family Nurse Practitioner

## 2020-12-22 VITALS — BP 136/76 | HR 70 | Temp 98.0°F | Resp 16 | Ht 64.0 in | Wt 151.0 lb

## 2020-12-22 DIAGNOSIS — Z853 Personal history of malignant neoplasm of breast: Secondary | ICD-10-CM

## 2020-12-31 ENCOUNTER — Ambulatory Visit (INDEPENDENT_AMBULATORY_CARE_PROVIDER_SITE_OTHER): Payer: Medicare Other | Admitting: Family Medicine

## 2021-01-05 ENCOUNTER — Telehealth: Payer: Self-pay | Admitting: Gastroenterology

## 2021-01-06 NOTE — Telephone Encounter (Signed)
Patient's husband reports that she has diarrhea intermittently with certain foods.  He is asking if she needs to have a colonoscopy. He is advised that unlikely to be a problem as long as it is intermittent.  He is advised that if she is having daily diarrhea they should call back to arrange an office visit.

## 2021-01-12 ENCOUNTER — Encounter: Admission: RE | Payer: Self-pay | Source: Ambulatory Visit

## 2021-01-12 ENCOUNTER — Encounter: Payer: Self-pay | Admitting: Pain Medicine

## 2021-01-12 ENCOUNTER — Ambulatory Visit: Admission: RE | Admit: 2021-01-12 | Payer: Medicare Other | Source: Ambulatory Visit | Admitting: Gastroenterology

## 2021-01-12 HISTORY — DX: Unspecified cataract: H26.9

## 2021-01-12 HISTORY — DX: Chronic kidney disease, stage 3 unspecified: N18.30

## 2021-01-12 HISTORY — DX: Exocrine pancreatic insufficiency: K86.81

## 2021-01-12 SURGERY — EGD, COLONOSCOPY
Anesthesia: Monitor Anesthesia Care | Site: Abdomen

## 2021-01-12 NOTE — H&P (Signed)
GI PRE PROCEDURE NOTE    Proceduralist Comments:   Review of Systems and Past Medical / Surgical History performed: Yes     Indications:GERD  and History of colonic polyps    Previous Adverse Reaction to Anesthesia or Sedation (if yes, describe): No    Physical Exam / Laboratory Data (If applicable)   Airway Classification: Class II    General: Alert and cooperative  Lungs: Lungs clear to auscultation  Cardiac: RRR, normal S1S2.    Abdomen: Soft, non tender. Normal active bowel sounds  Other:     No labs drawn    American Society of Anesthesiologists (ASA) Physical Status Classification:   ASA 2 - Patient with mild systemic disease with no functional limitations    Planned Sedation:   Deep sedation with anesthesia    Attestation:   Annick Dimaio has been reassessed immediately prior to the procedure and is an appropriate candidate for the planned sedation and procedure. Risks, benefits and alternatives to the planned procedure and sedation have been explained to the patient or guardian:  yes        Signed by: Konrad Saha, MD

## 2021-01-12 NOTE — Anesthesia Preprocedure Evaluation (Addendum)
Relevant Problems   CARDIO   (+) Essential hypertension   (+) Premature ventricular contractions (PVCs) (VPCs)      GI   (+) Gastroesophageal reflux disease without esophagitis      GU/RENAL   (+) Chronic kidney disease, stage III (moderate)      ENDO   (+) Type 2 diabetes mellitus without complication, without long-term current use of insulin      OTHER   (+) Pes anserinus bursitis       PSS Anesthesia Comments: BMI 25.92, TOP BANG=2. DM. HTN, CKD stage III, polyafthritis nodosa, OSA, GERD, Arrhythmia-Ho;lter monitor, ECG - Incomlete RBBB, PVC's, echocardiogram  2017- EF 55%    Pre-evaluation Note Incomplete - DO NOT USE FOR CLINICAL DECISIONS    Anesthesia Plan                                             Signed by: Juanell Fairly, MD 01/12/21 6:54 AM

## 2021-01-22 ENCOUNTER — Encounter (INDEPENDENT_AMBULATORY_CARE_PROVIDER_SITE_OTHER): Payer: Self-pay | Admitting: Family Medicine

## 2021-01-22 ENCOUNTER — Ambulatory Visit (INDEPENDENT_AMBULATORY_CARE_PROVIDER_SITE_OTHER): Payer: Medicare Other | Admitting: Family Medicine

## 2021-01-22 VITALS — BP 127/74 | HR 62 | Temp 97.5°F | Ht 63.78 in | Wt 148.0 lb

## 2021-01-22 DIAGNOSIS — I10 Essential (primary) hypertension: Secondary | ICD-10-CM

## 2021-01-22 DIAGNOSIS — H52209 Unspecified astigmatism, unspecified eye: Secondary | ICD-10-CM

## 2021-01-22 DIAGNOSIS — E119 Type 2 diabetes mellitus without complications: Secondary | ICD-10-CM

## 2021-01-22 DIAGNOSIS — D0512 Intraductal carcinoma in situ of left breast: Secondary | ICD-10-CM

## 2021-01-22 DIAGNOSIS — L408 Other psoriasis: Secondary | ICD-10-CM

## 2021-01-22 DIAGNOSIS — I493 Ventricular premature depolarization: Secondary | ICD-10-CM

## 2021-01-22 DIAGNOSIS — J301 Allergic rhinitis due to pollen: Secondary | ICD-10-CM

## 2021-01-22 DIAGNOSIS — Z0000 Encounter for general adult medical examination without abnormal findings: Secondary | ICD-10-CM

## 2021-01-22 DIAGNOSIS — H524 Presbyopia: Secondary | ICD-10-CM

## 2021-01-22 DIAGNOSIS — M797 Fibromyalgia: Secondary | ICD-10-CM

## 2021-01-22 DIAGNOSIS — N1831 Chronic kidney disease, stage 3a: Secondary | ICD-10-CM

## 2021-01-22 DIAGNOSIS — K219 Gastro-esophageal reflux disease without esophagitis: Secondary | ICD-10-CM

## 2021-01-22 DIAGNOSIS — R921 Mammographic calcification found on diagnostic imaging of breast: Secondary | ICD-10-CM

## 2021-01-22 DIAGNOSIS — R002 Palpitations: Secondary | ICD-10-CM

## 2021-01-22 HISTORY — DX: Type 2 diabetes mellitus without complications: E11.9

## 2021-01-22 LAB — COMPREHENSIVE METABOLIC PANEL
ALT: 12 U/L (ref 0–55)
AST (SGOT): 16 U/L (ref 5–34)
Albumin/Globulin Ratio: 1.7 (ref 0.9–2.2)
Albumin: 4 g/dL (ref 3.5–5.0)
Alkaline Phosphatase: 72 U/L (ref 37–117)
Anion Gap: 8 (ref 5.0–15.0)
BUN: 23 mg/dL — ABNORMAL HIGH (ref 7.0–19.0)
Bilirubin, Total: 0.6 mg/dL (ref 0.2–1.2)
CO2: 24 mEq/L (ref 21–29)
Calcium: 8.9 mg/dL (ref 7.9–10.2)
Chloride: 105 mEq/L (ref 100–111)
Creatinine: 1 mg/dL (ref 0.4–1.5)
Globulin: 2.4 g/dL (ref 2.0–3.7)
Glucose: 136 mg/dL — ABNORMAL HIGH (ref 70–100)
Potassium: 4.4 mEq/L (ref 3.5–5.1)
Protein, Total: 6.4 g/dL (ref 6.0–8.3)
Sodium: 137 mEq/L (ref 136–145)

## 2021-01-22 LAB — CBC AND DIFFERENTIAL
Absolute NRBC: 0 10*3/uL (ref 0.00–0.00)
Basophils Absolute Automated: 0.02 10*3/uL (ref 0.00–0.08)
Basophils Automated: 0.5 %
Eosinophils Absolute Automated: 0.04 10*3/uL (ref 0.00–0.44)
Eosinophils Automated: 1 %
Hematocrit: 39.2 % (ref 34.7–43.7)
Hgb: 12.2 g/dL (ref 11.4–14.8)
Immature Granulocytes Absolute: 0.02 10*3/uL (ref 0.00–0.07)
Immature Granulocytes: 0.5 %
Lymphocytes Absolute Automated: 0.87 10*3/uL (ref 0.42–3.22)
Lymphocytes Automated: 20.7 %
MCH: 27.7 pg (ref 25.1–33.5)
MCHC: 31.1 g/dL — ABNORMAL LOW (ref 31.5–35.8)
MCV: 89.1 fL (ref 78.0–96.0)
MPV: 12.1 fL (ref 8.9–12.5)
Monocytes Absolute Automated: 0.33 10*3/uL (ref 0.21–0.85)
Monocytes: 7.8 %
Neutrophils Absolute: 2.93 10*3/uL (ref 1.10–6.33)
Neutrophils: 69.5 %
Nucleated RBC: 0 /100 WBC (ref 0.0–0.0)
Platelets: 139 10*3/uL — ABNORMAL LOW (ref 142–346)
RBC: 4.4 10*6/uL (ref 3.90–5.10)
RDW: 14 % (ref 11–15)
WBC: 4.21 10*3/uL (ref 3.10–9.50)

## 2021-01-22 LAB — THYROID STIMULATING HORMONE (TSH), REFLEX ON ABNORMAL TO FREE T4, SERUM: TSH, Abn Reflex to Free T4, Serum: 0.83 u[IU]/mL (ref 0.35–4.94)

## 2021-01-22 LAB — LIPID PANEL
Cholesterol / HDL Ratio: 3.3
Cholesterol: 121 mg/dL (ref 0–199)
HDL: 37 mg/dL — ABNORMAL LOW (ref 40–9999)
LDL Calculated: 72 mg/dL (ref 0–99)
Triglycerides: 59 mg/dL (ref 34–149)
VLDL Calculated: 12 mg/dL (ref 10–40)

## 2021-01-22 LAB — MICROALBUMIN, RANDOM URINE
Urine Creatinine, Random: 56.5 mg/dL
Urine Microalbumin, Random: 159 — ABNORMAL HIGH (ref 0.0–30.0)
Urine Microalbumin/Creatinine Ratio: 281 ug/mg — ABNORMAL HIGH (ref 0–30)

## 2021-01-22 LAB — HEMOLYSIS INDEX: Hemolysis Index: 6 (ref 0–24)

## 2021-01-22 LAB — GFR: EGFR: 54.8

## 2021-01-22 LAB — POCT HEMOGLOBIN A1C: POCT Hgb A1C: 7.2 % — AB (ref 3.9–5.9)

## 2021-01-22 MED ORDER — FLUTICASONE PROPIONATE 50 MCG/ACT NA SUSP
2.0000 | Freq: Every evening | NASAL | 3 refills | Status: DC
Start: 2021-01-22 — End: 2021-06-02

## 2021-01-22 MED ORDER — LOSARTAN POTASSIUM 25 MG PO TABS
25.0000 mg | ORAL_TABLET | Freq: Every day | ORAL | 3 refills | Status: DC
Start: 2021-01-22 — End: 2022-05-04

## 2021-01-22 MED ORDER — LIDOCAINE 5 % EX PTCH
MEDICATED_PATCH | CUTANEOUS | 3 refills | Status: DC
Start: 2021-01-22 — End: 2021-06-02

## 2021-01-22 NOTE — Progress Notes (Signed)
Have you seen any specialists/other providers since your last visit with [place]?    Yes, nephrology, opthalmology, cardiology      Arm preference verified?   Yes    The patient is due for:  Advance Directive on File  Shingrix Vaccine 50+ (1)  Pneumonia Vaccine Age 70+ (2 of 2 - PPSV23)

## 2021-01-22 NOTE — Progress Notes (Signed)
[facility]            Katelyn Caldwell is a 70 y.o. female who presents today for the following Medicare Wellness Visit:  []  Initial Preventive Physical Exam (IPPE) - "Welcome to Medicare" preventive visit (Vision Screening required)   []  Annual Wellness Visit - Initial  [x]  Annual Wellness Visit - Subsequent     Health Risk Assessment:   During the past month, how would you rate your general health?:  Good  Which of the following tasks can you do without assistance - drive or take the bus alone; shop for groceries or clothes; prepare your own meals; do your own housework/laundry; handle your own finances/pay bills; eat, bathe or get around your home?: Drive or take the bus alone, Shop for groceries or clothes, Prepare your own meals, Do your own housework/laundry, Handle your own finances/pay bills, Eat, bathe, dress or get around your home  Which of the following problems have you been bothered by in the past month - dizzy when standing up; problems using the phone; feeling tired or fatigued; moderate or severe body pain?: Feeling tired or fatigued  Do you exercise for about 20 minutes 3 or more days per week?:Yes  During the past month was someone available to help if you needed and wanted help?  For example, if you felt nervous, lonely, got sick and had to stay in bed, needed someone to talk to, needed help with daily chores or needed help just taking care of yourself.: Yes  Do you always wear a seat belt?: Yes  Do you have any trouble taking medications the way you have been told to take them?: No  Have you been given any information that can help you with keeping track of your medications?: No  Do you have trouble paying for your medications?: No  Have you been given any information that can help you with hazards in your house, such as scatter rugs, furniture, etc?: No  Do you feel unsteady when standing or walking?: No  Do you worry about falling?: No  Have you fallen two or more  times in the past year?: No  Did you suffer any injuries from your falls in the past year?: No     Care Team:   Patient Care Team:  Raj Janus, MD as PCP - General (Family Medicine)  Miachel Roux, MD as Consulting Physician (Cardiology)  Luna Kitchens, [place]  Willaim Sheng, MD as Consulting Physician (Interventional Cardiology)  Tomasita Crumble, FNP as Nurse Practitioner (Family Nurse Practitioner)      Hospitalizations:   Hospitalization within past year: [x]  No  []  Yes     Diagnosis:      Screenings:   Ambulatory Screenings 12/25/2020 12/25/2020 01/15/2021   Falls Risk: [place] more than 2 times in past year - - N   Falls Risk: Suffer any injuries? - - N   Depression: PHQ2 Total Score 0 0 0   Depression: PHQ9 Total Score 0 0 0        Substance Use Disorder Screen:  In the past year, how often have you used the following?  1) Alcohol (For men, 5 or more drinks a day. For women, 4 or more drinks a day)  [x]  Never []  Once or Twice []  Monthly []  Weekly []  Daily or Almost Daily  2) Tobacco Products  [x]  Never []  Once or Twice []  Monthly []  Weekly []  Daily or Almost Daily  3)  Prescription Drugs for Non-Medical Reasons  [x]  Never []  Once or Twice []  Monthly []  Weekly []  Daily or Almost Daily  4) Illegal Drugs  [x]  Never []  Once or Twice []  Monthly []  Weekly []  Daily or Almost Daily       Functional Ability/Level of Safety:   Falls Risk/Home Safety Assessment:  ( see HRA and Screenings sections for additional assessment)  Home Safety: [x]  Stair handrails  [x]  Skid-resistant rugs/remove throw rugs   [x]  Grab bars  [x]  Clear pathways between rooms  [x]  Proper lighting stairs/ bathrooms/bedrooms  Get Up and Go (optional):  [x]   <20 secs  []   >20 secs    []   High risk for falls - Home Safety/Falls Risk Precautions reviewed with pt/family    Hearing Assessment:  Concerns for hearing loss: []  Yes  [x]   No  Hearing aids:   []   Right  []   Left  []   Bilateral   [x]   None  Whisper Test (optional):  []  Normal  []    Slightly decreased  []   Significantly decreased    Exercise:  Frequency:  []   No formal exercise  []   1-2x/wk  [x]   3-4x/wk  []   >4x/wk  Duration:  []   15-30 mins/day  []   30-45 mins/day  []   45+ mins/day  Intensity:  []   Light  []   Moderate  []   Heavy        Activities of Daily Living:   ADL's Independent Minimal  Assistance Moderate  Assistance Total   Assistance   Bathing [x]  []  []  []    Dressing [x]  []  []  []    Mobility   [x]  []  []  []    Transfer [x]  []  []  []    Eating [x]  []  []  []    Toileting [x]  []  []  []      IADL's Independent Minimal  Assistance Moderate  Assistance Total   Assistance   Phone [x]  []  []  []    Housekeeping [x]  []  []  []    Laundry [x]  []  []  []    Transportation [x]  []  []  []    Medications [x]  []  []  []    Finances [x]  []  []  []       ADL assistance: [x]  No assistance needed  []  Spouse  []  Sibling  []  Son   []  Daughter []  Children  []  Home Health Aide []  Other:       Advance Care Planning:   Discussion of Advance Directives:   []  Advance Directive in chart  []  Advance Directive not in chart - requested to provide []  No Advance Directive.  Form Provided  [x]  No Advance Directive.  Pt declines. []  Not addressed today  []  Other:     Exam:   BP 127/74 (BP Site: Left arm, Patient Position: Sitting, Cuff Size: Medium)    Pulse 62    Temp 97.5 F (36.4 C) (Temporal)    Ht 1.62 m (5' 3.78")    Wt 67.1 kg (148 lb)    SpO2 98%    BMI 25.58 kg/m      Physical Exam          Evaluation of Cognitive Function:   Mood/affect: [x]  Appropriate  []   Other:   Appearance: [x]  Neatly groomed  [x]  Adequately nourished  []  Other:  Family member/caregiver input: []  Present - no concerns  []   Not present in room  []  Present - concerns:    Cognitive Assessment:  Mini-Cog Result (three word registration- banana, sunrise, chair / clock drawing):   [x]   > 3  points - negative screen for dementia   []  3 recalled words - negative screen for dementia   []  1-2 recalled words and normal clock draw - negative for cognitive impairment   []   1-2 recalled words and abnormal clock draw - positive for cognitive impairment   []  0 recalled words - positive for cognitive impairment         Assessment/Plan:   1. Medicare annual wellness visit, subsequent  - CBC and differential  - Comprehensive metabolic panel  - Lipid panel  - TSH, Abn Reflex to Free T4, Serum  - Urine Microalbumin Random    2. Type 2 diabetes mellitus without complication, without long-term current use of insulin  - POCT Hemoglobin A1C  - CBC and differential  - Comprehensive metabolic panel  - Lipid panel  - Urine Microalbumin Random    3. Stage 3a chronic kidney disease  - CBC and differential  - Comprehensive metabolic panel  - Lipid panel    4. Presbyopia  - CBC and differential  - Comprehensive metabolic panel  - Lipid panel    5. Palpitations  - CBC and differential  - Comprehensive metabolic panel  - Lipid panel    6. Fibromyalgia  - CBC and differential  - Comprehensive metabolic panel  - Lipid panel  - lidocaine (Lidoderm) 5 %; Place 1 patch onto affected skin every twelve hours as needed for discomfort.  Remove each patch after 12 hours of use  Dispense: 30 patch; Refill: 3    7. Astigmatism, unspecified laterality, unspecified type  - CBC and differential  - Comprehensive metabolic panel  - Lipid panel    8. Essential hypertension  - CBC and differential  - Comprehensive metabolic panel  - Lipid panel  - losartan (COZAAR) 25 MG tablet; Take 1 tablet (25 mg total) by mouth daily  Dispense: 90 tablet; Refill: 3    9. Gastroesophageal reflux disease without esophagitis  - CBC and differential  - Comprehensive metabolic panel  - Lipid panel    10. Allergic rhinitis due to pollen, unspecified seasonality  - CBC and differential  - Comprehensive metabolic panel  - Lipid panel  - fluticasone (FLONASE) 50 MCG/ACT nasal spray; 2 sprays by Nasal route every evening  Dispense: 16 g; Refill: 3    11. Premature ventricular contractions (PVCs) (VPCs)  - CBC and differential  - Comprehensive  metabolic panel  - Lipid panel    12. Ductal carcinoma in situ (DCIS) of left breast  - CBC and differential  - Comprehensive metabolic panel  - Lipid panel    13. Other psoriasis    14. Breast calcifications           Raj Janus, MD    01/22/2021     The following sections were reviewed this encounter by the provider:           History:   Patient Active Problem List   Diagnosis    Adhesive capsulitis of shoulder    Allergic rhinitis    Chronic kidney disease, stage III (moderate)    Type 2 diabetes mellitus without complication, without long-term current use of insulin    Gastroesophageal reflux disease without esophagitis    Essential hypertension    Trochanteric bursitis of left hip    Premature ventricular contractions (PVCs) (VPCs)    Hypercalcemia    Trigger finger of right hand, unspecified finger    Ductal carcinoma in situ (DCIS) of left breast    Breast  calcifications    Arthralgia of hip    Astigmatism    Fibromyalgia    Pes anserinus bursitis    Palpitations    Other psoriasis    Cataract    Vitreous degeneration    Anatomical narrow angle borderline glaucoma    Presbyopia    Hemorrhoids    History of breast cancer      Past Medical History:   Diagnosis Date    Anxiety     no meds    Arrhythmia     PVCs    Bilateral cataracts     Chronic kidney disease     Stage 3 CKD - d/t PAN    CKD (chronic kidney disease), stage III     Diabetes mellitus, type 2     A1C = 7, FBS = 125    Encounter for blood transfusion 2003    when dx with PAN    Exocrine pancreatic insufficiency     EPI     Gastroesophageal reflux disease     Hemorrhagic diathesis 06/2002    PAN diagnosed at this time    Hyperlipidemia     Hypertension     130's over 80's per pt    Malignant neoplasm of breast 2020    L side, radiation June 2020 - lumpectomies    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     Past Surgical History:   Procedure Laterality Date     ABDOMINAL SURGERY  1983    C- section    BIOPSY, BREAST, TUMOR EXCISION, ULTRASOUND NEEDLE LOCALIZATION Left 12/25/2018    Procedure: BIOPSY, BREAST, TUMOR EXCISION, ULTRASOUND NEEDLE LOCALIZATION;  Surgeon: Boykin Peek, MD;  Location: [facility];  Service: General;  Laterality: Left;    BLADDER SUSPENSION  cystoscopy    benign    BREAST, LUMPECTOMY Left 12/25/2018    Procedure: BREAST, LUMPECTOMY;  Surgeon: Boykin Peek, MD;  Location: [facility];  Service: General;  Laterality: Left;  LEFT BREAST LUMPECTOMY WITH ULTRASOUND GUIDED WIRE PLACEMENT IN OR BY MD    BREAST, LUMPECTOMY Left 01/15/2019    Procedure: BREAST, LUMPECTOMY;  Surgeon: Boykin Peek, MD;  Location: [facility];  Service: General;  Laterality: Left;  LEFT BREAST LUMPECTOMY ( RE-EXCISION)    CATARACT EXTRACTION Bilateral     CESAREAN SECTION      1983    CYSTOSCOPY, BLADDER BIOPSY      EXCISION BIOPSY WITH NEEDLE LOCALIZATION  2012    left breast, benign    EYE SURGERY      cataract Fall and Spring of 2021    [place], [place] (MEDICAL)  2012    Reconstractiv surgery from sky accident.    RADIOFREQUENCY SPECTROSCOPY INTRAOPERATIVE MARGIN ASSESSMENT Left 12/25/2018    Procedure: RADIOFREQUENCY SPECTROSCOPY INTRAOPERATIVE MARGIN ASSESSMENT;  Surgeon: Boykin Peek, MD;  Location: [facility];  Service: General;  Laterality: Left;    RENAL BIOPSY  2003    SINUS SURGERY  06/2017     Allergies   Allergen Reactions    Ciprofloxacin Other (See Comments)     Severe Connective Tissue (joint) pain and stiffness    Nsaids Other (See Comments)     Pt states this causes her bleeding d/t PAN      Outpatient Medications Marked as Taking for the 01/22/21 encounter (Office Visit) with Raj Janus, MD   Medication Sig  Dispense Refill    atorvastatin (LIPITOR) 40 MG tablet Take 1 tablet (40 mg total) by mouth daily (Patient taking differently: Take 40 mg by mouth every evening) 90 tablet 3    Creon 36000-114000 units  Cap DR Particles         cyanocobalamin ([employer] VITAMIN B12) 1000 MCG tablet Take 0.5 tablets (500 mcg total) by mouth daily 45 tablet 3    Magnesium 400 MG Tab Take 400 mg by mouth daily      metFORMIN (GLUCOPHAGE-XR) 500 MG 24 hr tablet Take 1 tablet (500 mg total) by mouth nightly 90 tablet 3    metoprolol succinate XL (TOPROL-XL) 25 MG 24 hr tablet Take 25 mg by mouth every evening         pantoprazole (PROTONIX) 40 MG tablet Take 1 tablet (40 mg total) by mouth every evening 90 tablet 3    Probiotic Product (PROBIOTIC-10 PO) Take by mouth every other day        SITagliptin-MetFORMIN HCl (Janumet XR) 50-500 MG Tablet SR 24 hr Take 1 tablet by mouth 2 (two) times daily with meals 180 tablet 3    vitamin D (CHOLECALCIFEROL) 25 MCG (1000 UT) tablet Take 1 tablet (1,000 Units total) by mouth daily      [DISCONTINUED] fluticasone (FLONASE) 50 MCG/ACT nasal spray 2 sprays by Nasal route every evening         [DISCONTINUED] lidocaine (Lidoderm) 5 % Place 1 patch onto affected skin every twelve hours as needed for discomfort.  Remove each patch after 12 hours of use 30 patch 2    [DISCONTINUED] losartan (COZAAR) 25 MG tablet Take 1 tablet (25 mg total) by mouth daily 90 tablet 1     Social History     Tobacco Use    Smoking status: Never Smoker    Smokeless tobacco: Never Used   Vaping Use    Vaping Use: Never used   Substance Use Topics    Alcohol use: Yes     Alcohol/week: 2.0 standard drinks     Types: 2 Glasses of wine per week     Comment: 1-2 glass of wine a week.    Drug use: No      Family History   Problem Relation Age of Onset    Diabetes Mother     Thyroid disease Mother     No known problems Father            ===================================================================    Additional Documentation:

## 2021-01-23 NOTE — Progress Notes (Signed)
[facility]- 4.22.2022

## 2021-01-25 ENCOUNTER — Other Ambulatory Visit (INDEPENDENT_AMBULATORY_CARE_PROVIDER_SITE_OTHER): Payer: Self-pay | Admitting: Family Medicine

## 2021-01-25 ENCOUNTER — Encounter (INDEPENDENT_AMBULATORY_CARE_PROVIDER_SITE_OTHER): Payer: Self-pay | Admitting: Family Medicine

## 2021-01-25 DIAGNOSIS — N898 Other specified noninflammatory disorders of vagina: Secondary | ICD-10-CM

## 2021-01-25 MED ORDER — FREESTYLE LANCETS MISC
3 refills | Status: DC
Start: 2021-01-25 — End: 2023-05-24

## 2021-01-25 MED ORDER — FREESTYLE LITE TEST VI STRP
ORAL_STRIP | 3 refills | Status: DC
Start: 2021-01-25 — End: 2022-01-18

## 2021-01-25 MED ORDER — ESTRADIOL 10 MCG VA TABS
10.0000 ug | ORAL_TABLET | VAGINAL | 3 refills | Status: DC
Start: 2021-01-28 — End: 2022-01-14

## 2021-02-05 ENCOUNTER — Encounter (INDEPENDENT_AMBULATORY_CARE_PROVIDER_SITE_OTHER): Payer: Self-pay | Admitting: Family Medicine

## 2021-02-08 MED ORDER — METFORMIN HCL ER 500 MG PO TB24
500.0000 mg | ORAL_TABLET | Freq: Every evening | ORAL | 3 refills | Status: DC
Start: 2021-02-08 — End: 2021-02-16

## 2021-02-15 ENCOUNTER — Encounter (INDEPENDENT_AMBULATORY_CARE_PROVIDER_SITE_OTHER): Payer: Self-pay | Admitting: Family Medicine

## 2021-02-16 MED ORDER — METFORMIN HCL ER 500 MG PO TB24
500.0000 mg | ORAL_TABLET | Freq: Every evening | ORAL | 3 refills | Status: DC
Start: 2021-02-16 — End: 2021-02-17

## 2021-02-16 NOTE — Progress Notes (Signed)
Please resend, rx was sent to [facility].    Patient request it be sent to [facility].     rx pending.

## 2021-02-17 ENCOUNTER — Other Ambulatory Visit (INDEPENDENT_AMBULATORY_CARE_PROVIDER_SITE_OTHER): Payer: Self-pay | Admitting: Family Medicine

## 2021-02-17 MED ORDER — METFORMIN HCL ER 500 MG PO TB24
500.0000 mg | ORAL_TABLET | Freq: Every evening | ORAL | 3 refills | Status: DC
Start: 2021-02-17 — End: 2021-02-18

## 2021-02-17 MED ORDER — METFORMIN HCL ER 500 MG PO TB24
500.0000 mg | ORAL_TABLET | Freq: Every evening | ORAL | 3 refills | Status: DC
Start: 2021-02-17 — End: 2021-02-17

## 2021-02-17 NOTE — Telephone Encounter (Signed)
Good afternoon,  Patient would like Rx resent, she checked with pharmacy but nothing was received.    metFORMIN (GLUCOPHAGE-XR) 500 MG 24 hr tablet     [facility] [address] [facility] - [place], [place] - [address] [facility] Phone:  [phone]   Fax:  [phone]        Class: No Print

## 2021-02-17 NOTE — Addendum Note (Signed)
Addended by: Morey Hummingbird on: 02/17/2021 08:41 AM     Modules accepted: Orders

## 2021-02-17 NOTE — Progress Notes (Signed)
Please resend to [facility]

## 2021-02-18 ENCOUNTER — Other Ambulatory Visit (INDEPENDENT_AMBULATORY_CARE_PROVIDER_SITE_OTHER): Payer: Self-pay | Admitting: Family Medicine

## 2021-02-18 MED ORDER — METFORMIN HCL ER 500 MG PO TB24
500.0000 mg | ORAL_TABLET | Freq: Every evening | ORAL | 3 refills | Status: DC
Start: 2021-02-18 — End: 2022-01-18

## 2021-02-18 MED ORDER — METFORMIN HCL ER 500 MG PO TB24
500.0000 mg | ORAL_TABLET | Freq: Every evening | ORAL | 3 refills | Status: DC
Start: 2021-02-18 — End: 2021-02-18

## 2021-02-18 NOTE — Telephone Encounter (Signed)
The medication was sent to the wrong pharmacy she wants it sent to [facility]

## 2021-03-01 IMAGING — DX DG FOOT COMPLETE 3+V*R*
3 series · 3 of 3 positions shown · non-contrast
Comparison: Intraoperative views same date. Radiographs 09/07/2020.

CLINICAL DATA: Postop.

EXAM:
RIGHT FOOT COMPLETE - 3+ VIEW

[foot ap]
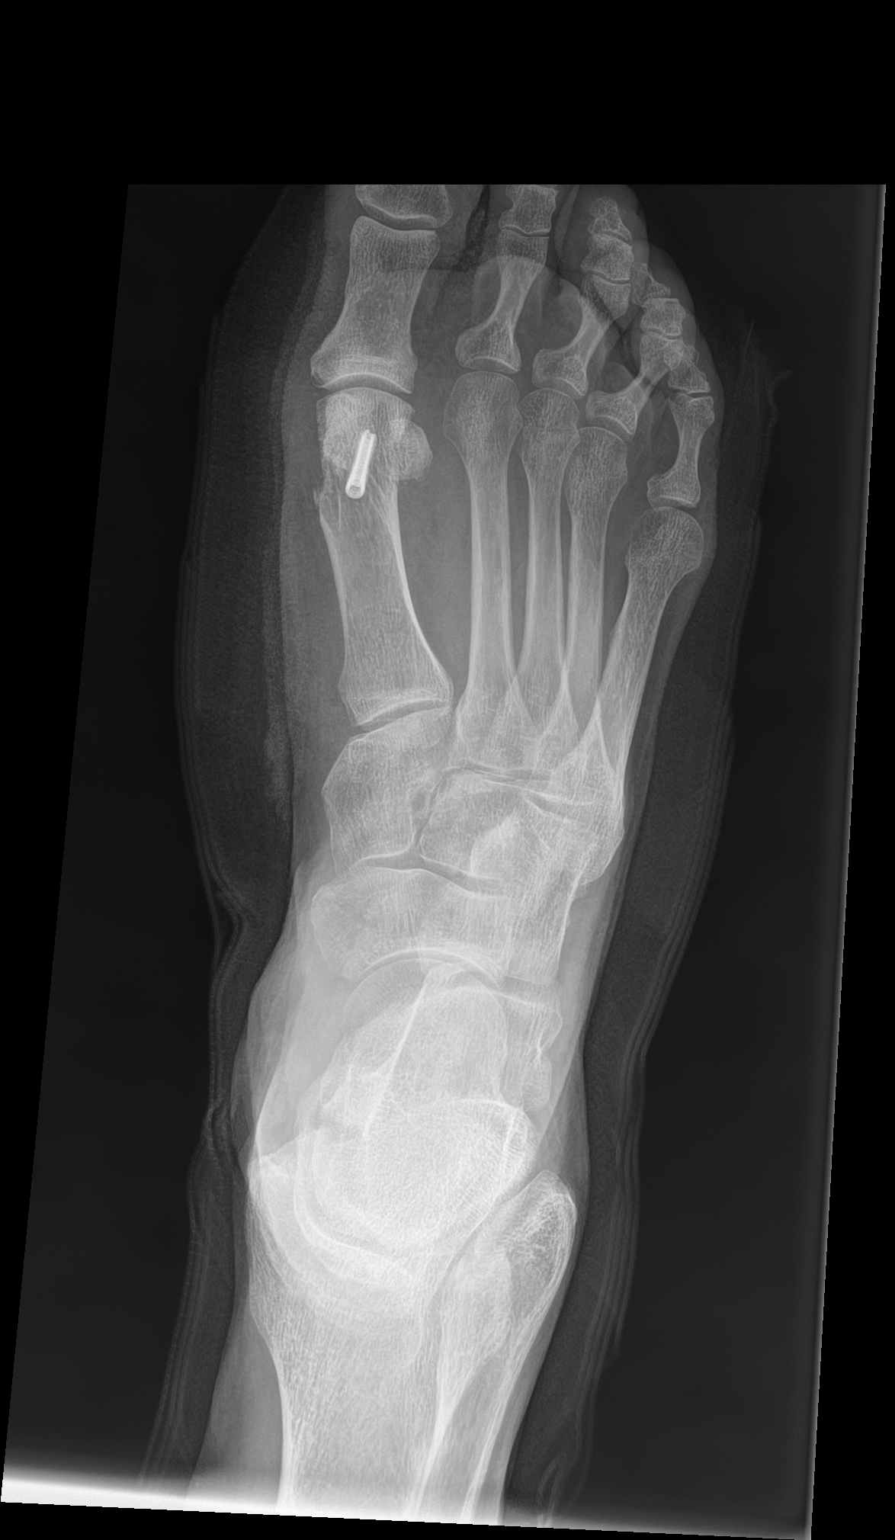

[foot obl]
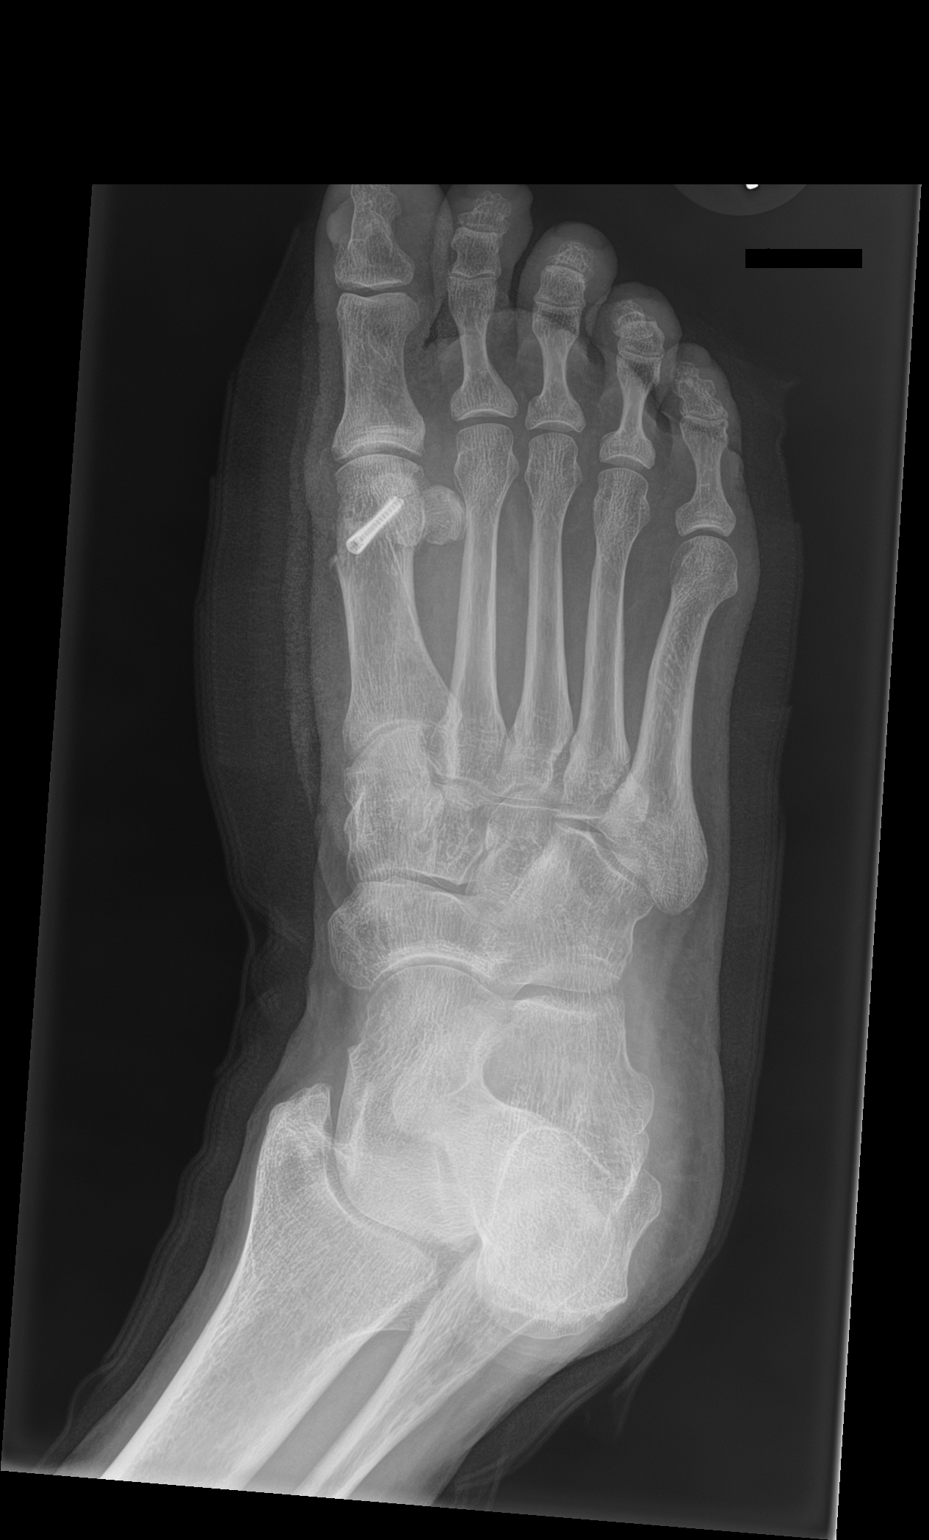

[foot lat]
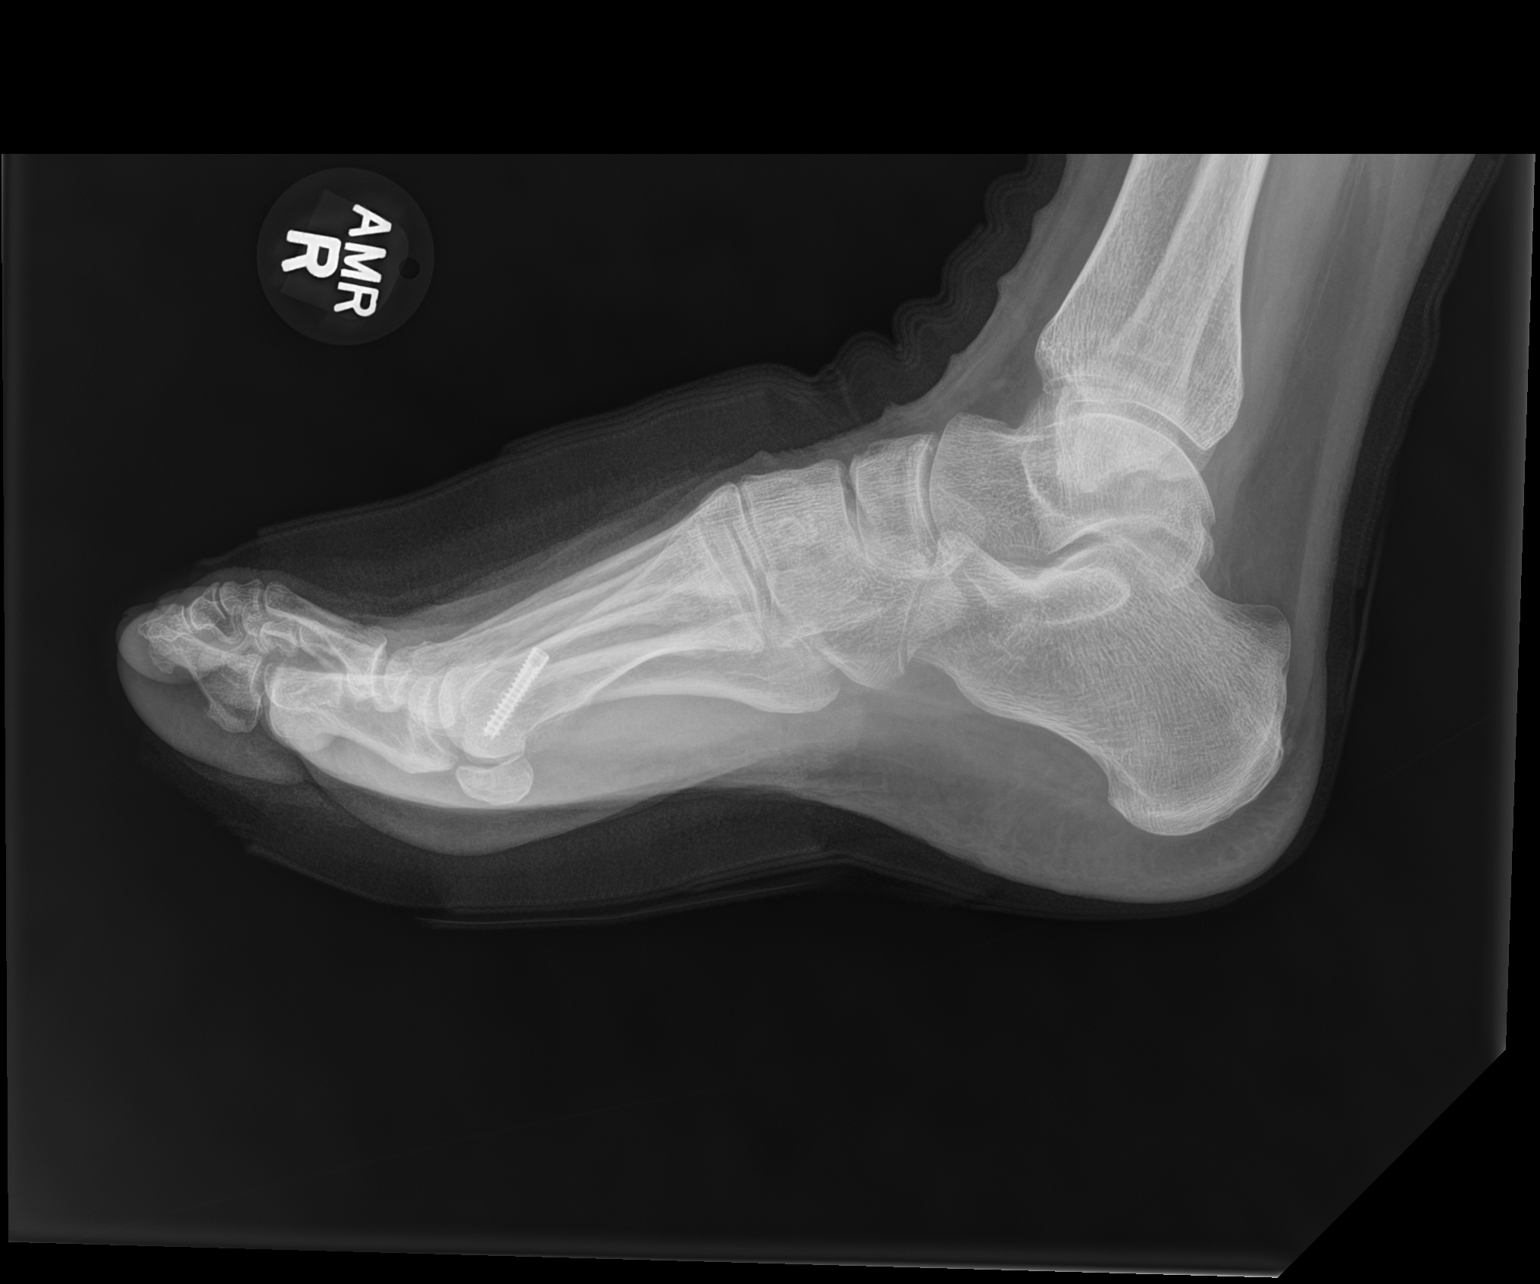

[3 of 3 positions shown; findings below may reference images not displayed]

FINDINGS: Interval postsurgical changes in the 1st metatarsal head consistent
with osteotomy. There is a threaded screw in the 1st metatarsal
neck. No evidence of acute fracture or dislocation. The joint spaces
are preserved.
IMPRESSION: Interval 1st metatarsal osteotomy/bunionectomy. No acute osseous
findings.

## 2021-03-16 ENCOUNTER — Ambulatory Visit: Payer: Medicare Other | Admitting: Gastroenterology

## 2021-03-22 ENCOUNTER — Ambulatory Visit: Payer: Medicare Other | Attending: Gastroenterology
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
## 2021-03-22 NOTE — Pre-Procedure Instructions (Signed)
[facility] PRE-PROCEDURAL EVALUATION CLINIC AFTER VISIT SUMMARY     KAMEELA, LEIPOLD  DOB:  July 23, 1951  MRN:  [id number]       Your procedure will take place at [facility]  [facility] PREPROCEDURE EVALUATION CLINIC  [address]  [place] [address]  Dept: [phone]  Dept Fax: [phone]      Your case is currently scheduled for 04/07/2021 at  Nyoka Cowden, MD.  You will need to arrive one hours prior to the scheduled start time.     The date and/or time of your surgery may change.  Your surgeon's office will notify you, up until the business day before surgery, if there is any change to your surgery date or time.  Please don't hesitate to call your surgeon's office directly with any questions.    Fasting Instructions for Procedures Requiring Anesthesia or Sedation     If you received preoperative diet instructions from your surgeon or surgeon's office, please follow those specific instructions. The instructions here are general instructions that do not pertain to all patients.    Solids Clear Liquids   or Ice Chips Breast Milk Infant   Formula Non-Human Milk   No solids for 8 hours prior to   your scheduled procedure time. You may have "clear" liquids or ice chips up to 2 hours prior to your specified arrival time.    Examples of clear liquids include water, apple juice, sports drinks such as Gatorade, coffee or tea without cream or milk. Sugar or sweetener may be added. Feeding must end 4 hours prior to   the scheduled procedure time.    DO NOT add cereal or thickeners. Feeding must end 6 hours prior to   the scheduled procedure time.    DO NOT add cereal or thickeners. Feeding must end 8 hours prior to   the scheduled procedure time.    DO NOT add cereal or thickeners.     You may park in the  or use the hospital's valet service - both of these are complimentary for out-patient procedures.  Be sure to bring your photo ID and insurance card with you.        If you need to  cancel or postpone your procedure, please call your surgeon's office immediately.      MEDICATION INSTRUCTIONS  Below are the instructions reviewed with you today regarding your medications, along with any specific instructions or restrictions prior to your procedure.  In addition to these instructions, remember that you should avoid vitamins, supplements, herbal and green teas and over-the-counter pain medications (ibuprofen, naproxen, etc.) for at least 7 days prior to your surgery.  If you take aspirin, please consult with the ordering prescriber and your surgeon regarding if and when you should stop aspirin.          SKIN PREPARATION INSTRUCTIONS  It is very important to clean your skin at home with a special germ-killing cleanser before your surgery day. Please clean your skin with an antimicrobial soap called 4% chlorhexidine gluconate, also known as CHG or Hibiclens.    CHG antimicrobial soap kills most germs on your skin. It reduces your risk of infection at your body's surgery site.     CHG may irritate recently shaved skin, so please do not shave or use any hair removal products prior to surgery.  If hair removal is necessary for your procedure, it will be done by the surgical staff in the operating room.  How do I use CHG at home?   - Use the CHG solution in the shower   - Use CHG for the evening before your surgery AND on the morning of your surgery, for a total of 2 showers at home   - If you are having spinal surgery or a joint replacement, use CHG both evenings before your surgery AND on the morning of your surgery, for a total of 3 showers at home.    How do I obtain CHG?   You can buy CHG antimicrobial solution (liquid) at your pharmacy. You do not need a prescription. Ask for the CHG antimicrobial solution at the pharmacy counter.  You will need 2-4 ounces of the 4% CHG solution for each shower     INSTRUCTIONS: CLEANING WITH CHG SOLUTION IN THE SHOWER   1. For shower # 1 the evening before  your surgery, wash yourself in the shower with your regular soap and shampoo first. Completely rinse the soap and shampoo off of your hair and body.   2. With shower water off, apply the CHG solution with your hands. Apply the soap to your entire body from the neck down. Include your groin area but not your genitals (private parts).   3. Clean the spot where your incision will be for about 3 minutes.  If you cannot reach this spot, have someone help you with the bathing.  Make sure they have thoroughly cleaned their hands before helping you.   4. Once you have finished putting the CHG on your skin and 3 minutes have passed, turn the water back on and rinse the CHG solution off your body.   5. Do not wash with regular soap after using the CHG solution   6. For the 2nd shower (taken the morning of your surgery), follow the instructions above but do not use regular soap. Only use the CHG solution.   7.  If you are having a spinal procedure or joint replacement surgery, you should take a total of 3 showers (2 nights prior to your procedure and the morning of your surgery).    After each shower:   - Completely dry the skin with a fresh, clean, dry towel   - Do not use lotions, powders, or deodorants.   - Use clean sheets and pajamas each day     On surgery day:   After your shower dress in clean clothes to wear to the hospital.   Do not wear any jewelry to the hospital.  When you arrive at the hospital on surgery day, the nurse will ask you if you completed your CHG showers at home as instructed.     CHG Warnings:  * Do not apply CHG above your neck or on your private areas.  * If redness or skin irritation occurs from using CHG, stop using and contact your surgeon.  * Use CHG solution in the shower.  If it is not possible for you to shower, contact your surgeon's office for further instructions.        QUESTIONS?  If you have any questions prior to your procedure, please call [phone] and leave a message.  This  voice mail is monitored between 7am and 6pm Monday through Friday.    Our goal is to ensure you are safe for your upcoming procedure and to give you easy to understand instructions about getting ready for your procedure.

## 2021-04-06 NOTE — H&P (Signed)
GI PRE PROCEDURE NOTE    Proceduralist Comments:   Review of Systems and Past Medical / Surgical History performed: Yes     Indications:Dyspepsia and History of colonic polyps and Chronic diarrhea    Previous Adverse Reaction to Anesthesia or Sedation (if yes, describe): No    Physical Exam / Laboratory Data (If applicable)   Airway Classification: Class II    General: Alert and cooperative  Lungs: Lungs clear to auscultation  Cardiac: RRR, normal S1S2.    Abdomen: Soft, non tender. Normal active bowel sounds  Other:     No labs drawn    American Society of Anesthesiologists (ASA) Physical Status Classification:   ASA 2 - Patient with mild systemic disease with no functional limitations    Planned Sedation:   Deep sedation with anesthesia    Attestation:   Katelyn Caldwell has been reassessed immediately prior to the procedure and is an appropriate candidate for the planned sedation and procedure. Risks, benefits and alternatives to the planned procedure and sedation have been explained to the patient or guardian:  yes        Signed by: Nyoka Cowden, MD

## 2021-04-07 ENCOUNTER — Encounter
Admission: RE | Disposition: A | Discharge status: Home / Self Care | Payer: Self-pay | Source: Ambulatory Visit | Attending: Gastroenterology

## 2021-04-07 ENCOUNTER — Ambulatory Visit
Admission: RE | Admit: 2021-04-07 | Discharge: 2021-04-07 | Disposition: A | Discharge status: Home / Self Care | Payer: Medicare Other | Source: Ambulatory Visit | Attending: Gastroenterology | Admitting: Gastroenterology

## 2021-04-07 ENCOUNTER — Ambulatory Visit: Payer: Medicare Other | Admitting: Pain Medicine

## 2021-04-07 ENCOUNTER — Encounter: Payer: Self-pay | Admitting: Gastroenterology

## 2021-04-07 ENCOUNTER — Ambulatory Visit: Payer: Self-pay

## 2021-04-07 DIAGNOSIS — R12 Heartburn: Secondary | ICD-10-CM

## 2021-04-07 DIAGNOSIS — D125 Benign neoplasm of sigmoid colon: Secondary | ICD-10-CM | POA: Insufficient documentation

## 2021-04-07 DIAGNOSIS — Z86010 Personal history of colonic polyps: Secondary | ICD-10-CM

## 2021-04-07 DIAGNOSIS — K5730 Diverticulosis of large intestine without perforation or abscess without bleeding: Secondary | ICD-10-CM | POA: Insufficient documentation

## 2021-04-07 DIAGNOSIS — Z7984 Long term (current) use of oral hypoglycemic drugs: Secondary | ICD-10-CM | POA: Insufficient documentation

## 2021-04-07 DIAGNOSIS — K641 Second degree hemorrhoids: Secondary | ICD-10-CM | POA: Insufficient documentation

## 2021-04-07 DIAGNOSIS — K529 Noninfective gastroenteritis and colitis, unspecified: Secondary | ICD-10-CM | POA: Insufficient documentation

## 2021-04-07 DIAGNOSIS — Z1211 Encounter for screening for malignant neoplasm of colon: Secondary | ICD-10-CM | POA: Insufficient documentation

## 2021-04-07 DIAGNOSIS — K449 Diaphragmatic hernia without obstruction or gangrene: Secondary | ICD-10-CM | POA: Insufficient documentation

## 2021-04-07 DIAGNOSIS — D124 Benign neoplasm of descending colon: Secondary | ICD-10-CM | POA: Insufficient documentation

## 2021-04-07 DIAGNOSIS — K219 Gastro-esophageal reflux disease without esophagitis: Secondary | ICD-10-CM | POA: Insufficient documentation

## 2021-04-07 DIAGNOSIS — Z79899 Other long term (current) drug therapy: Secondary | ICD-10-CM | POA: Insufficient documentation

## 2021-04-07 DIAGNOSIS — K644 Residual hemorrhoidal skin tags: Secondary | ICD-10-CM | POA: Insufficient documentation

## 2021-04-07 DIAGNOSIS — K2970 Gastritis, unspecified, without bleeding: Secondary | ICD-10-CM | POA: Insufficient documentation

## 2021-04-07 DIAGNOSIS — R197 Diarrhea, unspecified: Secondary | ICD-10-CM

## 2021-04-07 HISTORY — PX: EGD, COLONOSCOPY: SHX3799

## 2021-04-07 LAB — GLUCOSE WHOLE BLOOD - POCT: Whole Blood Glucose POCT: 156 mg/dL — ABNORMAL HIGH (ref 70–100)

## 2021-04-07 SURGERY — EGD, COLONOSCOPY
Anesthesia: Anesthesia General | Site: Abdomen

## 2021-04-07 MED ORDER — FENTANYL CITRATE (PF) 50 MCG/ML IJ SOLN (WRAP)
INTRAMUSCULAR | Status: DC | PRN
Start: 2021-04-07 — End: 2021-04-07
  Administered 2021-04-07: 50 ug via INTRAVENOUS

## 2021-04-07 MED ORDER — LIDOCAINE HCL (PF) 2 % IJ SOLN
INTRAMUSCULAR | Status: AC
Start: 2021-04-07 — End: ?
  Filled 2021-04-07: qty 5

## 2021-04-07 MED ORDER — PROPOFOL 10 MG/ML IV EMUL (WRAP)
INTRAVENOUS | Status: AC
Start: 2021-04-07 — End: ?
  Filled 2021-04-07: qty 60

## 2021-04-07 MED ORDER — LACTATED RINGERS IV SOLN
INTRAVENOUS | Status: DC
Start: 2021-04-07 — End: 2021-04-07

## 2021-04-07 MED ORDER — LIDOCAINE HCL 2 % IJ SOLN
INTRAMUSCULAR | Status: DC | PRN
Start: 2021-04-07 — End: 2021-04-07
  Administered 2021-04-07: 5 mL

## 2021-04-07 MED ORDER — FENTANYL CITRATE (PF) 50 MCG/ML IJ SOLN (WRAP)
INTRAMUSCULAR | Status: AC
Start: 2021-04-07 — End: ?
  Filled 2021-04-07: qty 2

## 2021-04-07 MED ORDER — PROPOFOL 10 MG/ML IV EMUL (WRAP)
INTRAVENOUS | Status: DC | PRN
Start: 2021-04-07 — End: 2021-04-07
  Administered 2021-04-07 (×3): 20 mg via INTRAVENOUS
  Administered 2021-04-07: 30 mg via INTRAVENOUS
  Administered 2021-04-07: 20 mg via INTRAVENOUS
  Administered 2021-04-07: 70 mg via INTRAVENOUS
  Administered 2021-04-07: 10 mg via INTRAVENOUS

## 2021-04-07 SURGICAL SUPPLY — 38 items
BLOCK BITE MAXI 60FR LF STRD STRAP SDPRT (Procedure Accessories) ×1
BLOCK BITE OD60 FR STURDY STRAP SIDEPORT (Procedure Accessories) ×1
BLOCK BITE OD60 FR STURDY STRAP SIDEPORT DENTAL RETENTION RIM MAXI (Procedure Accessories) ×1 IMPLANT
ELECTRODE ADULT PATIENT RETURN L9 FT REM POLYHESIVE ACRYLIC FOAM (Procedure Accessories) IMPLANT
ELECTRODE PATIENT RETURN L9 FT VALLEYLAB (Procedure Accessories)
ELECTRODE PT RTN RM PHSV ACRL FM C30- LB (Procedure Accessories)
FORCEPS BIOPSY L240 CM JUMBO MICROMESH (Instrument)
FORCEPS BIOPSY L240 CM JUMBO MICROMESH TEETH STREAMLINE CATHETER (Instrument) IMPLANT
FORCEPS BIOPSY L240 CM LARGE CAPACITY (Instrument) ×1
FORCEPS BIOPSY L240 CM MICROMESH TEETH STREAMLINE CATHETER NEEDLE (Instrument) IMPLANT
FORCEPS BX SS JMB RJ 4 2.8MM 240CM STRL (Instrument)
FORCEPS BX SS LG CPC RJ 4 2.4MM 240CM (Instrument) ×1
GLOVE EXAM LARGE NITRILE CHEMOTHERAPY POWDER FREE SENSE OATMEAL (Glove) ×1 IMPLANT
GLOVE EXAM LARGE NITRILE POWDER FREE SENSE OATMEAL (Glove) ×1 IMPLANT
GLOVE EXAM NITRILE RESTORE LG (Glove) ×1
GLV EXAM NITRILE RESTORE LG (Glove) ×2
GOWN ISL PP PE REG LG LF FULL BCK NK TIE (Gown) ×4
GOWN ISOLATION REGULAR LARGE FULL BACK NECK TIE ELASTIC CUFF (Gown) ×2 IMPLANT
MASK FACE FM FLDSHLD LF LVL 3 TIE EYSHLD (Personal Protection) ×4 IMPLANT
NEEDLE SCLEROTHERAPY CARR-LOCKE OD25 GA ODSEC2.5 MM L230 CM INJECTION (Needles) IMPLANT
NEEDLE SCLEROTHERAPY OD25 GA ODSEC2.5 MM (Needles)
NEEDLE SCLRTX SS TFLN CRLK 25GA 2.5MM (Needles)
SNARE ESCP MIC CPTVTR 13MM 240IN STRL (GE Lab Supplies)
SNARE SMALL HEXAGON CAPTIVATOR STIFF ENDOSCOPIC POLYPECTOMY (GE Lab Supplies) IMPLANT
SPONGE GAUZE L4 IN X W4 IN 16 PLY (Dressing) ×1
SPONGE GAUZE L4 IN X W4 IN 16 PLY MAXIMUM ABSORBENT USP TYPE VII (Dressing) ×1 IMPLANT
SPONGE GZE CTTN CRTY 4X4IN LF NS 16 PLY (Dressing) ×1
SYRINGE 50 ML GRADUATE NONPYROGENIC DEHP (Syringes, Needles) ×1
SYRINGE 50 ML GRADUATE NONPYROGENIC DEHP FREE PVC FREE BD MEDICAL (Syringes, Needles) ×1 IMPLANT
SYRINGE MED 50ML LF STRL GRAD N-PYRG (Syringes, Needles) ×1
TRAP MCS PLS LF STRL SCR CAP TUBE ID LBL (Procedure Accessories)
TRAP MUCUS SCREW CAP TUBE ID LABEL (Procedure Accessories)
TRAP MUCUS SCREW CAP TUBE ID LABEL MEDLINE PLASTIC CLEAR (Procedure Accessories) IMPLANT
TRAP SPEC REM ETRAP 15CM LF STRL MAGNIFY (Procedure Accessories)
TRAP SPECIMEN REMOVAL L15 CM MAGNIFY (Procedure Accessories)
TRAP SPECIMEN REMOVAL L15 CM MAGNIFY WINDOW MEASUREMENT GUIDE ETRAP (Procedure Accessories) IMPLANT
WATER STERILE PLASTIC POUR BOTTLE 250 ML (Irrigation Solutions) ×1 IMPLANT
WATER STRL 250ML LF PLS PR BTL (Irrigation Solutions) ×1

## 2021-04-07 NOTE — Anesthesia Preprocedure Evaluation (Addendum)
Anesthesia Evaluation    AIRWAY    Mallampati: II    TM distance: >3 FB  Neck ROM: full  Mouth Opening:full  Planned to use difficult airway equipment: No CARDIOVASCULAR    cardiovascular exam normal, regular and normal       DENTAL    no notable dental hx     PULMONARY    pulmonary exam normal and clear to auscultation     OTHER FINDINGS                  Relevant Problems   NEURO/PSYCH   (+) History of breast cancer      CARDIO   (+) Essential hypertension   (+) Hemorrhoids   (+) Premature ventricular contractions (PVCs) (VPCs)      GI   (+) Gastroesophageal reflux disease without esophagitis      GU/RENAL   (+) Chronic kidney disease, stage III (moderate)      ENDO   (+) Type 2 diabetes mellitus without complication, without long-term current use of insulin      OTHER   (+) Pes anserinus bursitis       PSS Anesthesia Comments: BMI 24,8, STOP BANG=2, OSA, DM ,  HTN, PVC's         Anesthesia Plan    ASA 3     general                     intravenous induction   Detailed anesthesia plan: general IV  Monitors/Adjuncts: other    Post Op: other  Trial extubation is not planned.  Post op pain management: per surgeon    informed consent obtained                   Signed by: Juanell Fairly, MD 04/07/21 7:39 AM

## 2021-04-07 NOTE — Discharge Instr - AVS First Page (Addendum)
Endoscopy Discharge Instructions  General Instructions:  1. Following sedation, your judgement, perception, and coordination are considered impaired. Even though you may feel awake and alert, you are considered legally intoxicated. Therefore, until the next morning;  Do not Drive  Do not operate appliances or equipment that requires reaction time (e.g. stove, electrical tools, machinery)  Do not sign legal documents or be involved in important decisions.  Do not smoke if alone  Do not drink alcoholic beverages  Go directly home and rest for several hours before resuming your routine activities.  It is highly recommended to have a responsible adult stay with you for the next 24 hours    2. Tenderness, swelling or pain may occur at the IV site where you received sedation. If you experience this, apply warm soaks to the area. Notify your physician if this persists.    Instructions Specific To Procedures - Report To Physician Any Of The Following:    Upper Endoscopy, ERCP, Dilations   1. Pain in Chest   2. Nausea/vomitting   3. Fevers/Chills within 24 hours after procedure. Temp>101deg F   4. Severe and persistent abdominal pain and bloating     In Addition:   Mild throat soreness may follow this procedure. Warm salt water gargling or    lozenges of your choice will most likely relieve your discomfort or cold drinks and   popsicles.     Colon/Sigmoidoscopy/Proctoscopy   1. Severe and persistent abdominal pain/bloating which does not subside within   2-3 hours   2. Large amount of rectal bleeding (some mucosal blood streaking may occur,   especially if biopsy or polypectomy was done or if hemorrhoids are present.   3. Nausea/vomitting   4. Fevers/Chills within 24 hours after procedure. Temp>101deg F     In Addition:   If polyp has been removed, DO NOT take aspirin or aspirin containing products   (e.g. Anacin, Alka Seltzer, Bufferin, Etc.) or non-steroidal anti-inflammatory drugs   (e.g. Advil, Motrin, etc.) for   days unless otherwise advised by doctor. Tylenol   or extra Strength Tylenol is permitted.    Bronchoscopy   Do not eat or drink until .    1. At this time, try sips of water first. If you are able to do so without gagging or   coughing, you may eat and drink. If not, try again after 15 minutes.   2. Some blood streaked sputum is common in the first day or so after   broncoscopy, but your MD should be called if greater than 2-3 tablespoons is   produced.   3. Call your MD if there is new or increased shortness of breath or new or   unusual chest discomfort or T> 101 deg F.    Additional Discharge Instructions  Your diet after the procedure:   Special Instructions:   Prescriptions given:   Patient education literature given;       If you have questions or problems contact your MD immediately. If you need immediate attention, call your MD, 911 and/or go to nearest emergency room.

## 2021-04-07 NOTE — Transfer of Care (Signed)
Anesthesia Transfer of Care Note    Patient: Katelyn Caldwell    Procedures performed: Procedure(s) with comments:  EGD, COLONOSCOPY - EGD, COLONOSCOPY  Asst=N    Anesthesia type: General TIVA    Patient location:Phase II PACU    Last vitals:   Vitals:    04/07/21 1020   BP: 157/79   Pulse: 65   Resp: 16   Temp: 36.4 C (97.6 F)   SpO2: 98%       Post pain: Patient not complaining of pain, continue current therapy      Mental Status:awake    Respiratory Function: tolerating room air    Cardiovascular: stable    Nausea/Vomiting: patient not complaining of nausea or vomiting    Hydration Status: adequate    Post assessment: no apparent anesthetic complications    Signed by: Venida Jarvis, CRNA  04/07/21 12:07 PM

## 2021-04-07 NOTE — Anesthesia Postprocedure Evaluation (Signed)
Anesthesia Post Evaluation    Patient: Katelyn Caldwell    Procedure(s) with comments:  EGD, COLONOSCOPY - EGD, COLONOSCOPY  Asst=N    Anesthesia type: general    Last Vitals:   Vitals Value Taken Time   BP  04/07/21 1212   Temp  04/07/21 1212   Pulse 68 04/07/21 1212   Resp  04/07/21 1212   SpO2 97 % 04/07/21 1212   Vitals shown include unvalidated device data.              Anesthesia Post Evaluation:     Patient Evaluated: PACU  Patient Participation: complete - patient participated  Level of Consciousness: awake  Pain Score: 0  Pain Management: adequate    Airway Patency: patent    Anesthetic complications: No      PONV Status: none    Cardiovascular status: stable  Respiratory status: room air  Hydration status: stable        Signed by: Juanell Fairly, MD, 04/07/2021 12:12 PM

## 2021-04-08 ENCOUNTER — Encounter: Payer: Self-pay | Admitting: Gastroenterology

## 2021-06-02 ENCOUNTER — Encounter (INDEPENDENT_AMBULATORY_CARE_PROVIDER_SITE_OTHER): Payer: Self-pay | Admitting: Family Medicine

## 2021-06-02 DIAGNOSIS — J301 Allergic rhinitis due to pollen: Secondary | ICD-10-CM

## 2021-06-02 DIAGNOSIS — E119 Type 2 diabetes mellitus without complications: Secondary | ICD-10-CM

## 2021-06-02 DIAGNOSIS — E7800 Pure hypercholesterolemia, unspecified: Secondary | ICD-10-CM

## 2021-06-02 DIAGNOSIS — M797 Fibromyalgia: Secondary | ICD-10-CM

## 2021-06-03 MED ORDER — FLUTICASONE PROPIONATE 50 MCG/ACT NA SUSP
2.0000 | Freq: Every evening | NASAL | 3 refills | Status: DC
Start: 2021-06-03 — End: 2023-05-24

## 2021-06-03 MED ORDER — ATORVASTATIN CALCIUM 80 MG PO TABS
80.0000 mg | ORAL_TABLET | Freq: Every evening | ORAL | 3 refills | Status: DC
Start: 2021-06-03 — End: 2023-03-16

## 2021-06-03 MED ORDER — JANUMET XR 50-500 MG PO TB24
1.0000 | ORAL_TABLET | Freq: Two times a day (BID) | ORAL | 3 refills | Status: DC
Start: 2021-06-03 — End: 2022-07-13

## 2021-06-03 MED ORDER — METOPROLOL SUCCINATE ER 25 MG PO TB24
25.0000 mg | ORAL_TABLET | Freq: Every evening | ORAL | 3 refills | Status: AC
Start: 2021-06-03 — End: 2021-09-01

## 2021-06-03 MED ORDER — LIDOCAINE 5 % EX PTCH
MEDICATED_PATCH | CUTANEOUS | 3 refills | Status: DC
Start: 2021-06-03 — End: 2022-01-18

## 2021-06-24 ENCOUNTER — Encounter (INDEPENDENT_AMBULATORY_CARE_PROVIDER_SITE_OTHER): Payer: Self-pay | Admitting: Family Medicine

## 2021-06-25 ENCOUNTER — Encounter (INDEPENDENT_AMBULATORY_CARE_PROVIDER_SITE_OTHER): Payer: Self-pay | Admitting: Family Medicine

## 2021-07-20 ENCOUNTER — Other Ambulatory Visit: Payer: Self-pay

## 2021-07-20 ENCOUNTER — Ambulatory Visit (AMBULATORY_SURGERY_CENTER): Payer: Medicare Other | Admitting: *Deleted

## 2021-07-20 VITALS — Ht 59.75 in | Wt 120.0 lb

## 2021-07-20 DIAGNOSIS — Z1211 Encounter for screening for malignant neoplasm of colon: Secondary | ICD-10-CM

## 2021-07-20 NOTE — Progress Notes (Signed)
No egg or soy allergy known to patient  No issues known to pt with past sedation with any surgeries or procedures Patient denies ever being told they had issues or difficulty with intubation  No FH of Malignant Hyperthermia Pt is not on diet pills Pt is not on  home 02  Pt is not on blood thinners  Pt states issues with constipation - uses Exlax - not hard daily - she doesnt have a BM daily - occ soft but hard at times as well  No A fib or A flutter  Pt is fully vaccinated  for Covid     Due to the COVID-19 pandemic we are asking patients to follow certain guidelines in [facility] and the [facility]   Pt aware of COVID protocols and [facility] guidelines   Pv done with husband and pt today over the phone

## 2021-08-10 ENCOUNTER — Ambulatory Visit (AMBULATORY_SURGERY_CENTER): Payer: Medicare Other | Admitting: Gastroenterology

## 2021-08-10 ENCOUNTER — Encounter: Payer: Self-pay | Admitting: Gastroenterology

## 2021-08-10 VITALS — BP 135/50 | HR 74 | Temp 96.0°F | Resp 13 | Ht 59.75 in | Wt 129.0 lb

## 2021-08-10 DIAGNOSIS — Z1211 Encounter for screening for malignant neoplasm of colon: Secondary | ICD-10-CM | POA: Diagnosis not present

## 2021-08-10 MED ORDER — SODIUM CHLORIDE 0.9 % IV SOLN: 500.0000 mL | Freq: Once | INTRAVENOUS | Status: DC

## 2021-08-10 NOTE — Patient Instructions (Signed)
Information on diverticulosis given to you.  Resume previous diet and medications.  No repeat colonoscopy needed due to age and the absence of colonic polyps.   YOU HAD AN ENDOSCOPIC PROCEDURE TODAY AT THE [facility]:   Refer to the procedure report that was given to you for any specific questions about what was found during the examination.  If the procedure report does not answer your questions, please call your gastroenterologist to clarify.  If you requested that your care partner not be given the details of your procedure findings, then the procedure report has been included in a sealed envelope for you to review at your convenience later.  YOU SHOULD EXPECT: Some feelings of bloating in the abdomen. Passage of more gas than usual.  Walking can help get rid of the air that was put into your GI tract during the procedure and reduce the bloating. If you had a lower endoscopy (such as a colonoscopy or flexible sigmoidoscopy) you may notice spotting of blood in your stool or on the toilet paper. If you underwent a bowel prep for your procedure, you may not have a normal bowel movement for a few days.  Please Note:  You might notice some irritation and congestion in your nose or some drainage.  This is from the oxygen used during your procedure.  There is no need for concern and it should clear up in a day or so.  SYMPTOMS TO REPORT IMMEDIATELY:  Following lower endoscopy (colonoscopy or flexible sigmoidoscopy):  Excessive amounts of blood in the stool  Significant tenderness or worsening of abdominal pains  Swelling of the abdomen that is new, acute  Fever of 100F or higher For urgent or emergent issues, a gastroenterologist can be reached at any hour by calling [phone]. Do not use MyChart messaging for urgent concerns.    DIET:  We do recommend a small meal at first, but then you may proceed to your regular diet.  Drink plenty of fluids but you should avoid alcoholic  beverages for 24 hours.  ACTIVITY:  You should plan to take it easy for the rest of today and you should NOT DRIVE or use heavy machinery until tomorrow (because of the sedation medicines used during the test).    FOLLOW UP: Our staff will call the number listed on your records 48-72 hours following your procedure to check on you and address any questions or concerns that you may have regarding the information given to you following your procedure. If we do not reach you, we will leave a message.  We will attempt to reach you two times.  During this call, we will ask if you have developed any symptoms of COVID 19. If you develop any symptoms (ie: fever, flu-like symptoms, shortness of breath, cough etc.) before then, please call [phone].  If you test positive for Covid 19 in the 2 weeks post procedure, please call and report this information to [place].    If any biopsies were taken you will be contacted by phone or by letter within the next 1-3 weeks.  Please call us at [phone] if you have not heard about the biopsies in 3 weeks.    SIGNATURES/CONFIDENTIALITY: You and/or your care partner have signed paperwork which will be entered into your electronic medical record.  These signatures attest to the fact that that the information above on your After Visit Summary has been reviewed and is understood.  Full responsibility of the confidentiality of this discharge  information lies with you and/or your care-partner.

## 2021-08-10 NOTE — Progress Notes (Signed)
Pt's states no medical or surgical changes since previsit or office visit.  ° °Vitals CW °

## 2021-08-10 NOTE — Progress Notes (Signed)
Report to PACU, RN, vss, BBS= Clear.  

## 2021-08-10 NOTE — Op Note (Signed)
[facility] Patient Name: Casey Weber Procedure Date: 08/10/2021 1:36 PM MRN: [id number] Endoscopist: Meryl Dare , MD Age: 70 Referring MD:  Date of Birth: 08-06-1951 Gender: Female Account #: [id number] Procedure:                Colonoscopy Indications:              Screening for colorectal malignant neoplasm Medicines:                Monitored Anesthesia Care Procedure:                Pre-Anesthesia Assessment:                           - Prior to the procedure, a History and Physical                            was performed, and patient medications and                            allergies were reviewed. The patient's tolerance of                            previous anesthesia was also reviewed. The risks                            and benefits of the procedure and the sedation                            options and risks were discussed with the patient.                            All questions were answered, and informed consent                            was obtained. Prior Anticoagulants: The patient has                            taken no previous anticoagulant or antiplatelet                            agents. ASA Grade Assessment: II - A patient with                            mild systemic disease. After reviewing the risks                            and benefits, the patient was deemed in                            satisfactory condition to undergo the procedure.                           After obtaining informed consent, the colonoscope  was passed under direct vision. Throughout the                            procedure, the patient's blood pressure, pulse, and                            oxygen saturations were monitored continuously. The                            Olympus PCF-H190DL ([id number]) Colonoscope was                            introduced through the anus and advanced to the the                            cecum,  identified by appendiceal orifice and                            ileocecal valve. The ileocecal valve, appendiceal                            orifice, and rectum were photographed. The quality                            of the bowel preparation was adequate after                            extensive lavage and suction. The colonoscopy was                            performed without difficulty. The patient tolerated                            the procedure well. Scope In: 1:42:49 PM Scope Out: 2:03:39 PM Scope Withdrawal Time: 0 hours 15 minutes 20 seconds  Total Procedure Duration: 0 hours 20 minutes 50 seconds  Findings:                 The perianal and digital rectal examinations were                            normal.                           A few small-mouthed diverticula were found in the                            sigmoid colon.                           The exam was otherwise without abnormality on                            direct and retroflexion views. Complications:            No immediate complications. Estimated blood loss:  None. Estimated Blood Loss:     Estimated blood loss: none. Impression:               - Mild diverticulosis in the sigmoid colon.                           - The examination was otherwise normal on direct                            and retroflexion views.                           - No specimens collected. Recommendation:           - Patient has a contact number available for                            emergencies. The signs and symptoms of potential                            delayed complications were discussed with the                            patient. Return to normal activities tomorrow.                            Written discharge instructions were provided to the                            patient.                           - Resume previous diet.                           - Continue present medications.                            - No repeat colonoscopy due to age and the absence                            of colonic polyps. Meryl Dare, MD 08/10/2021 2:11:23 PM This report has been signed electronically.
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
## 2021-08-12 ENCOUNTER — Telehealth: Payer: Self-pay

## 2021-08-12 ENCOUNTER — Telehealth: Payer: Self-pay | Admitting: *Deleted

## 2021-08-12 NOTE — Telephone Encounter (Signed)
  Follow up Call-  Call back number 08/10/2021 01/21/2020  Post procedure Call Back phone  # [phone]  Permission to leave phone message Yes Yes  Some recent data might be hidden     Patient questions:  Do you have a fever, pain , or abdominal swelling? No. Pain Score  0 *  Have you tolerated food without any problems? Yes.    Have you been able to return to your normal activities? Yes.    Do you have any questions about your discharge instructions: Diet   No. Medications  No. Follow up visit  No.  Do you have questions or concerns about your Care? No. -asked about a pair of underwear and grey pants that were left. Checked with front desk and nothing was there.   Actions: * If pain score is 4 or above: No action needed, pain <4.  Have you developed a fever since your procedure? no  2.   Have you had an respiratory symptoms (SOB or cough) since your procedure? no  3.   Have you tested positive for COVID 19 since your procedure no  4.   Have you had any family members/close contacts diagnosed with the COVID 19 since your procedure?  no   If yes to any of these questions please route to Laverna Peace, RN and Karlton Lemon, RN

## 2021-08-12 NOTE — Telephone Encounter (Signed)
First attempt follow up call to pt, lm for pt to call if having any problems or questions, otherwise we will call them back later this morning or early this afternoon.  °

## 2021-10-07 ENCOUNTER — Encounter (INDEPENDENT_AMBULATORY_CARE_PROVIDER_SITE_OTHER): Payer: Self-pay | Admitting: Family Medicine

## 2021-10-07 ENCOUNTER — Ambulatory Visit (INDEPENDENT_AMBULATORY_CARE_PROVIDER_SITE_OTHER): Payer: Medicare Other | Admitting: Family Medicine

## 2021-10-07 VITALS — BP 137/83 | HR 66 | Temp 97.1°F | Ht 63.78 in | Wt 146.0 lb

## 2021-10-07 DIAGNOSIS — E119 Type 2 diabetes mellitus without complications: Secondary | ICD-10-CM

## 2021-10-07 DIAGNOSIS — M797 Fibromyalgia: Secondary | ICD-10-CM

## 2021-10-07 DIAGNOSIS — I10 Essential (primary) hypertension: Secondary | ICD-10-CM

## 2021-10-07 DIAGNOSIS — N1831 Chronic kidney disease, stage 3a: Secondary | ICD-10-CM

## 2021-10-07 DIAGNOSIS — K8681 Exocrine pancreatic insufficiency: Secondary | ICD-10-CM | POA: Insufficient documentation

## 2021-10-07 NOTE — Progress Notes (Signed)
Chief Complaint   Patient presents with    Diabetes Follow-up     Labs from [facility];         S:  Pt here to discuss multiple issues:        Problem   Exocrine Pancreatic Insufficiency    1/23:  Dx in 2/22.  Had been following weith Dr. Arneta Cliche who is nowe retiring.  Will need new referral.     Hypercalcemia    Noted recently with renal labs given hx of renal disease.  Stopped vitamin d and calcium supplements being taken at that time about 1 month ago.  No hx renal stones, no psychosis, no abdominal pain.  Has occurred prior with normal ionized calcium on review.    1/23:  Last evaluation including vitamin D normal about 1 month ago.  Has hx of renal disease and follows this issue with nephrology as well.  Overdue for bone scan.       Essential Hypertension    Recently with elevation in her evening BP to ~160-170 SBP despite good hydration and using cozaar regularly.  No smoking.  No NSAIDs.  No excessive EtOH.  Has reduced sodium but to uncertain amount.  Exercising (walking) seems to help.  No decongestants.  Has used cozaar with additional dose with some benefit and we are looking to make that a routine.  1/23:  Reports normal levels at home.  A bit elevated here today.     Type 2 Diabetes Mellitus Without Complication, Without Long-Term Current Use of Insulin    Hx gestational diabetes.  Last eye exam about 2015.  On cozaar.  On statin.  Avoids ASA due to PAN.  Last HgBA1c in 05/2015.  05/2018:  No medication issues.  Due for A1c.  12/22:  Due for A1c.  Last noted about 1 month prior and elevated and has been elevated past year.  Losing weight 10-12 pounds over about 1 year without intention.       Chronic Kidney Disease, Stage III (Moderate)    relatively stable CKD stage 3 w/ sCr ranging from 1.0 to 1.3 since 2003.  eGFR= 48.  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    11/2018:  Being followed by nephrology.  Potassium continues to run higher.  On  losartan.  Eats a diet rich in potassium.  Hydrates ok but could do better.  1 caffeine daily and 3-5 glasses of wine weekly.    05/2019:  Stays well hydrated.  Has noted some foam in her urine. Due for renal check.  Exercise can be complicated by recovery and fatigue.  Eating well.    1/23:  Following nephrology.  Fairly stable levels.             Allergies   Allergen Reactions    Ciprofloxacin Other (See Comments)     Severe Connective Tissue (joint) pain and stiffness    Nsaids Other (See Comments)     Pt states this causes her bleeding d/t PAN       Current Outpatient Medications   Medication Sig Dispense Refill    atorvastatin (LIPITOR) 40 MG tablet 80 mg      Creon 36000-114000 units Cap DR Particles         cyanocobalamin ([employer] VITAMIN B12) 1000 MCG tablet Take 0.5 tablets (500 mcg total) by mouth daily 45 tablet 3    glucose blood (FREESTYLE LITE) test strip Check blood sugar once  daily 100 strip 3    Lancets (freestyle) lancets Check blood sugar once daily 100 each 3    lidocaine (Lidoderm) 5 % Place 1 patch onto affected skin every twelve hours as needed for discomfort.  Remove each patch after 12 hours of use 30 patch 3    losartan (COZAAR) 25 MG tablet Take 1 tablet (25 mg total) by mouth daily 90 tablet 3    Magnesium 400 MG Tab Take 400 mg by mouth nightly Patient taking half dose      metFORMIN (GLUCOPHAGE-XR) 500 MG 24 hr tablet Take 1 tablet (500 mg total) by mouth nightly 90 tablet 3    pantoprazole (PROTONIX) 40 MG tablet Take 1 tablet (40 mg total) by mouth every evening 90 tablet 3    Probiotic Product (PROBIOTIC-10 PO) Take by mouth every other day        SITagliptin-MetFORMIN HCl (Janumet XR) 50-500 MG Tablet SR 24 hr Take 1 tablet by mouth 2 (two) times daily with meals 180 tablet 3    vitamin D (CHOLECALCIFEROL) 25 MCG (1000 UT) tablet Take 1 tablet (1,000 Units total) by mouth daily (Patient taking differently: Take 500 Units by mouth daily)      estradiol 10 MCG Tab Place 10 mcg vaginally  Once daily on Sunday and Thursday evening (Patient not taking: Reported on 10/07/2021) 24 tablet 3     No current facility-administered medications for this visit.       Review of Systems   Constitutional:  Positive for weight loss. Negative for fever and malaise/fatigue.   Cardiovascular:  Negative for chest pain.   Gastrointestinal:  Negative for nausea and vomiting.   Genitourinary:  Negative for dysuria.   Musculoskeletal:  Positive for joint pain and myalgias.   Skin:  Negative for rash.   Neurological:  Negative for sensory change and headaches.   Endo/Heme/Allergies:  Negative for polydipsia.   All other systems reviewed and are negative.    All other systems were reviewed and are negative    O:  BP 137/83 (BP Site: Left arm, Patient Position: Sitting, Cuff Size: Large)   Pulse 66   Temp 97.1 F (36.2 C)   Ht 1.62 m (5' 3.78")   Wt 66.2 kg (146 lb)   SpO2 97%   BMI 25.23 kg/m , Body mass index is 25.23 kg/m.  Vital signs reviewed  GEN:  NAD, nonobese  NEURO:  CN2-12 without focal deficit  HEENT:  normal eyes  PULM:  unlabored respirations  SKIN:  dry, no visible rash      A/P:     1. Exocrine pancreatic insufficiency  Ambulatory referral to Gastroenterology      2. Type 2 diabetes mellitus without complication, without long-term current use of insulin  Endocrinology Referral: Lavonna Rua, MD [facility])      3. Stage 3a chronic kidney disease        4. Fibromyalgia  Rheumatology Referral: Delrae Sawyers, MD [facility])      5. Hypercalcemia  Dxa Bone Density Axial Skeleton      6. Essential hypertension              Risk & Benefits of the new medication(s) were explained to the patient (and family) who verbalized understanding & agreed to the treatment plan. Patient (family) encouraged to contact me/clinical staff with any questions/concerns    Raj Janus, MD

## 2021-10-07 NOTE — Progress Notes (Signed)
Have you seen any specialists since your last visit with [place]?  Yes, nephrology, dermatology      The patient was informed that the following HM items are still outstanding:   nothing at this time, HM is up-to-date.

## 2021-10-27 ENCOUNTER — Ambulatory Visit
Admission: RE | Admit: 2021-10-27 | Discharge: 2021-10-27 | Disposition: A | Discharge status: Home / Self Care | Payer: Medicare Other | Source: Ambulatory Visit | Attending: Family Medicine | Admitting: Family Medicine

## 2021-10-27 ENCOUNTER — Other Ambulatory Visit (INDEPENDENT_AMBULATORY_CARE_PROVIDER_SITE_OTHER): Payer: Self-pay | Admitting: Family Medicine

## 2021-10-27 DIAGNOSIS — M810 Age-related osteoporosis without current pathological fracture: Secondary | ICD-10-CM

## 2021-11-02 ENCOUNTER — Encounter (INDEPENDENT_AMBULATORY_CARE_PROVIDER_SITE_OTHER): Payer: Self-pay | Admitting: Family Medicine

## 2021-11-02 DIAGNOSIS — M8580 Other specified disorders of bone density and structure, unspecified site: Secondary | ICD-10-CM | POA: Insufficient documentation

## 2021-11-15 ENCOUNTER — Encounter (INDEPENDENT_AMBULATORY_CARE_PROVIDER_SITE_OTHER): Payer: Self-pay | Admitting: Surgery

## 2021-11-15 ENCOUNTER — Telehealth (INDEPENDENT_AMBULATORY_CARE_PROVIDER_SITE_OTHER): Payer: Medicare Other | Admitting: Surgery

## 2021-11-15 DIAGNOSIS — K8681 Exocrine pancreatic insufficiency: Secondary | ICD-10-CM
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
## 2021-11-16 ENCOUNTER — Telehealth (INDEPENDENT_AMBULATORY_CARE_PROVIDER_SITE_OTHER): Payer: Medicare Other | Admitting: Family Medicine

## 2021-11-16 ENCOUNTER — Encounter (INDEPENDENT_AMBULATORY_CARE_PROVIDER_SITE_OTHER): Payer: Self-pay | Admitting: Family Medicine

## 2021-11-16 DIAGNOSIS — M8580 Other specified disorders of bone density and structure, unspecified site: Secondary | ICD-10-CM

## 2021-11-16 DIAGNOSIS — E7800 Pure hypercholesterolemia, unspecified: Secondary | ICD-10-CM

## 2021-11-16 NOTE — Progress Notes (Signed)
Chief Complaint   Patient presents with    Follow-up     Osteopenia         Verbal consent has been obtained from the patient to conduct a video and telephone visit to minimize exposure to COVID-19: yes    S:      Problem   Osteopenia    1/23:  Noted on DEXA scan.  2/23:  On vit d supplement.  Has hx of hypercalcemia and has reduced to 500 IU daily.  No kidney stones.  Has been on cellcept and cytoxin in the past.  Femur density is actually better than 10 years prior.       Hypercalcemia    Noted recently with renal labs given hx of renal disease.  Stopped vitamin d and calcium supplements being taken at that time about 1 month ago.  No hx renal stones, no psychosis, no abdominal pain.  Has occurred prior with normal ionized calcium on review.    1/23:  Last evaluation including vitamin D normal about 1 month ago.  Has hx of renal disease and follows this issue with nephrology as well.  Overdue for bone scan.    2/23:  Now current on bone scan and osteopenia.  Ionized calcium and pth have both been checked.  Not asmoker.  Walking regularly, some Centrix and some elipitical.           Allergies   Allergen Reactions    Ciprofloxacin Other (See Comments)     Severe Connective Tissue (joint) pain and stiffness    Nsaids Other (See Comments)     Pt states this causes her bleeding d/t PAN       Current Outpatient Medications   Medication Sig Dispense Refill    atorvastatin (LIPITOR) 40 MG tablet 80 mg      Creon 36000-114000 units Cap DR Particles         cyanocobalamin ([employer] VITAMIN B12) 1000 MCG tablet Take 0.5 tablets (500 mcg total) by mouth daily 45 tablet 3    estradiol 10 MCG Tab Place 10 mcg vaginally Once daily on Sunday and Thursday evening (Patient not taking: Reported on 10/07/2021) 24 tablet 3    fluticasone (FLONASE) 50 MCG/ACT nasal spray 1 spray by Nasal route daily      glucose blood (FREESTYLE LITE) test strip Check blood sugar once daily 100 strip 3    Lancets (freestyle) lancets Check blood sugar once  daily 100 each 3    lidocaine (Lidoderm) 5 % Place 1 patch onto affected skin every twelve hours as needed for discomfort.  Remove each patch after 12 hours of use 30 patch 3    losartan (COZAAR) 25 MG tablet Take 1 tablet (25 mg total) by mouth daily 90 tablet 3    Magnesium 400 MG Tab Take 400 mg by mouth nightly Patient taking half dose      metFORMIN (GLUCOPHAGE-XR) 500 MG 24 hr tablet Take 1 tablet (500 mg total) by mouth nightly 90 tablet 3    metoprolol tartrate (LOPRESSOR) 25 MG tablet Take 25 mg by mouth daily      pantoprazole (PROTONIX) 40 MG tablet Take 1 tablet (40 mg total) by mouth every evening 90 tablet 3    Probiotic Product (PROBIOTIC-10 PO) Take by mouth every other day        SITagliptin-MetFORMIN HCl (Janumet XR) 50-500 MG Tablet SR 24 hr Take 1 tablet by mouth 2 (two) times daily with meals 180 tablet 3  vitamin D (CHOLECALCIFEROL) 25 MCG (1000 UT) tablet Take 1 tablet (1,000 Units total) by mouth daily (Patient taking differently: Take 500 Units by mouth daily)       No current facility-administered medications for this visit.       Review of Systems   Constitutional:  Negative for fever, malaise/fatigue and weight loss.   HENT:  Negative for sore throat.    Eyes:  Negative for blurred vision.   Respiratory:  Negative for cough and shortness of breath.    Cardiovascular:  Negative for chest pain and palpitations.   Gastrointestinal:  Negative for nausea and vomiting.   Genitourinary:  Negative for dysuria.   Musculoskeletal:  Negative for myalgias.   Skin:  Negative for rash.   Neurological:  Negative for sensory change and headaches.   Endo/Heme/Allergies:  Negative for polydipsia.   All other systems reviewed and are negative.    All other systems were reviewed and are negative    O:  There were no vitals taken for this visit., There is no height or weight on file to calculate BMI.  Vital signs reviewed  GEN:  NAD, nonobese  NEURO:  CN2-12 without focal defecit  PULM:  unlabored  respirations  SKIN:  dry, no visible rash    A/P:     1. Hypercholesterolemia  Referral to Cardiology: [facility] (INTERNAL)      2. Hypercalcemia  Continue current management; followed also by nephrology        3. Osteopenia, unspecified location  Continue current management low dose d          Time spent in discussion: 30 minutes    Risk & Benefits of the new medication(s) were explained to the patient (and family) who verbalized understanding & agreed to the treatment plan. Patient (family) encouraged to contact me/clinical staff with any questions/concerns    Raj Janus, MD

## 2021-11-22 ENCOUNTER — Other Ambulatory Visit (INDEPENDENT_AMBULATORY_CARE_PROVIDER_SITE_OTHER): Payer: Self-pay

## 2021-11-22 ENCOUNTER — Encounter (INDEPENDENT_AMBULATORY_CARE_PROVIDER_SITE_OTHER): Payer: Self-pay | Admitting: Surgery

## 2021-11-22 ENCOUNTER — Encounter (INDEPENDENT_AMBULATORY_CARE_PROVIDER_SITE_OTHER): Payer: Self-pay

## 2021-11-22 DIAGNOSIS — K8681 Exocrine pancreatic insufficiency: Secondary | ICD-10-CM

## 2021-11-22 NOTE — Telephone Encounter (Signed)
02/20 @ 11:47 am, Pt saw Dr Linde Gillis and she is requesting refill for Creon .

## 2021-11-23 ENCOUNTER — Other Ambulatory Visit: Payer: Medicare Other

## 2021-11-24 ENCOUNTER — Other Ambulatory Visit (INDEPENDENT_AMBULATORY_CARE_PROVIDER_SITE_OTHER): Payer: Self-pay | Admitting: Family Nurse Practitioner

## 2021-11-26 NOTE — Telephone Encounter (Signed)
I do not see that he wants to refill it. It only says stool studies per his note.

## 2021-11-26 NOTE — Telephone Encounter (Signed)
I do not see that he wants to refill it per his note. Says that she needs stool tests done.

## 2021-12-03 ENCOUNTER — Encounter (INDEPENDENT_AMBULATORY_CARE_PROVIDER_SITE_OTHER): Payer: Self-pay | Admitting: Family Medicine

## 2021-12-08 ENCOUNTER — Other Ambulatory Visit (INDEPENDENT_AMBULATORY_CARE_PROVIDER_SITE_OTHER): Payer: Self-pay | Admitting: Family Medicine

## 2021-12-08 ENCOUNTER — Encounter (INDEPENDENT_AMBULATORY_CARE_PROVIDER_SITE_OTHER): Payer: Self-pay

## 2021-12-08 NOTE — Telephone Encounter (Signed)
Patient is requesting a refill for    Creon 36000-114000 units Cap DR Particles    To be sent to -     [facility] [place], [place] - [address]  Phone:  [phone]   Fax:  [phone]     Please advise - last visit was video 11/16/2021    Patient will be out of this medication in two days    Phone number for patient  [phone]

## 2021-12-09 ENCOUNTER — Other Ambulatory Visit (INDEPENDENT_AMBULATORY_CARE_PROVIDER_SITE_OTHER): Payer: Self-pay

## 2021-12-09 NOTE — Telephone Encounter (Signed)
Pt stop by office requesting refill on Creon , medication never prescribed by Dr Linde Gillis. Last seen 11/15/2021.

## 2021-12-10 ENCOUNTER — Telehealth (INDEPENDENT_AMBULATORY_CARE_PROVIDER_SITE_OTHER): Payer: Medicare Other | Admitting: Family Medicine

## 2021-12-10 ENCOUNTER — Encounter (INDEPENDENT_AMBULATORY_CARE_PROVIDER_SITE_OTHER): Payer: Self-pay | Admitting: Family Medicine

## 2021-12-10 DIAGNOSIS — E119 Type 2 diabetes mellitus without complications: Secondary | ICD-10-CM

## 2021-12-10 DIAGNOSIS — K8681 Exocrine pancreatic insufficiency: Secondary | ICD-10-CM

## 2021-12-10 MED ORDER — CREON 36000-114000 UNITS PO CPEP
1.0000 | ORAL_CAPSULE | Freq: Four times a day (QID) | ORAL | 0 refills | Status: DC
Start: 2021-12-10 — End: 2021-12-10

## 2021-12-10 MED ORDER — CREON 36000-114000 UNITS PO CPEP
1.0000 | ORAL_CAPSULE | Freq: Four times a day (QID) | ORAL | 0 refills | Status: DC
Start: 2021-12-10 — End: 2021-12-15

## 2021-12-10 NOTE — Progress Notes (Signed)
Chief Complaint   Patient presents with    pancreatic insuf     Needs temporary med refill       Verbal consent has been obtained from the patient to conduct a video and telephone visit to minimize exposure to COVID-19: yes    S:      Problem   Exocrine Pancreatic Insufficiency    1/23:  Dx in 2/22.  Had been following weith Dr. Arneta Cliche who is nowe retiring.  Will need new referral.  3/23:  Patient has requested a new prescription from GI about 1 month prior and has yet to have the medication prescribed.  Despite multiple attempts to communicate with them she is going to run out of her medication.           Allergies   Allergen Reactions    Ciprofloxacin Other (See Comments)     Severe Connective Tissue (joint) pain and stiffness    Nsaids Other (See Comments)     Pt states this causes her bleeding d/t PAN       Current Outpatient Medications   Medication Sig Dispense Refill    atorvastatin (LIPITOR) 40 MG tablet 80 mg      Creon 36000-114000 units Cap DR Particles Take 1 capsule by mouth 4 (four) times daily 120 capsule 0    cyanocobalamin ([employer] VITAMIN B12) 1000 MCG tablet Take 0.5 tablets (500 mcg total) by mouth daily 45 tablet 3    estradiol 10 MCG Tab Place 10 mcg vaginally Once daily on Sunday and Thursday evening (Patient not taking: Reported on 10/07/2021) 24 tablet 3    fluticasone (FLONASE) 50 MCG/ACT nasal spray 1 spray by Nasal route daily      glucose blood (FREESTYLE LITE) test strip Check blood sugar once daily 100 strip 3    Lancets (freestyle) lancets Check blood sugar once daily 100 each 3    lidocaine (Lidoderm) 5 % Place 1 patch onto affected skin every twelve hours as needed for discomfort.  Remove each patch after 12 hours of use 30 patch 3    losartan (COZAAR) 25 MG tablet Take 1 tablet (25 mg total) by mouth daily 90 tablet 3    Magnesium 400 MG Tab Take 400 mg by mouth nightly Patient taking half dose      metFORMIN (GLUCOPHAGE-XR) 500 MG 24 hr tablet Take 1 tablet (500 mg total) by mouth  nightly 90 tablet 3    metoprolol tartrate (LOPRESSOR) 25 MG tablet Take 25 mg by mouth daily      pantoprazole (PROTONIX) 40 MG tablet Take 1 tablet (40 mg total) by mouth every evening 90 tablet 3    Probiotic Product (PROBIOTIC-10 PO) Take by mouth every other day        SITagliptin-MetFORMIN HCl (Janumet XR) 50-500 MG Tablet SR 24 hr Take 1 tablet by mouth 2 (two) times daily with meals 180 tablet 3    vitamin D (CHOLECALCIFEROL) 25 MCG (1000 UT) tablet Take 1 tablet (1,000 Units total) by mouth daily (Patient taking differently: Take 500 Units by mouth daily)       No current facility-administered medications for this visit.       Review of Systems   Constitutional:  Negative for fever, malaise/fatigue and weight loss.   Cardiovascular:  Negative for chest pain.   Gastrointestinal:  Negative for nausea and vomiting.   Genitourinary:  Negative for dysuria.   Musculoskeletal:  Negative for myalgias.   Skin:  Negative for rash.  Neurological:  Negative for sensory change and headaches.   Endo/Heme/Allergies:  Negative for polydipsia.   All other systems reviewed and are negative.    All other systems were reviewed and are negative    O:  There were no vitals taken for this visit., There is no height or weight on file to calculate BMI.  Vital signs reviewed  GEN:  NAD, nonobese  NEURO:  CN2-12 without focal defecit  PULM:  unlabored respirations  SKIN:  dry, no visible rash    A/P:     1. Exocrine pancreatic insufficiency  Creon 36000-114000 units Cap DR Particles    Also printed Creon 36000-114000 units Cap DR Particles      2. Type 2 diabetes mellitus without complication, without long-term current use of insulin  Hemoglobin A1C          Time spent in discussion: 16 minutes    Risk & Benefits of the new medication(s) were explained to the patient (and family) who verbalized understanding & agreed to the treatment plan. Patient (family) encouraged to contact me/clinical staff with any questions/concerns    Raj Janus, MD

## 2021-12-13 ENCOUNTER — Other Ambulatory Visit (FREE_STANDING_LABORATORY_FACILITY): Payer: Medicare Other

## 2021-12-13 ENCOUNTER — Other Ambulatory Visit: Payer: Medicare Other

## 2021-12-13 DIAGNOSIS — K8681 Exocrine pancreatic insufficiency: Secondary | ICD-10-CM

## 2021-12-13 DIAGNOSIS — E119 Type 2 diabetes mellitus without complications: Secondary | ICD-10-CM

## 2021-12-13 LAB — COMPREHENSIVE METABOLIC PANEL
ALT: 9 U/L (ref 0–55)
AST (SGOT): 16 U/L (ref 5–41)
Albumin/Globulin Ratio: 1.6 (ref 0.9–2.2)
Albumin: 3.9 g/dL (ref 3.5–5.0)
Alkaline Phosphatase: 70 U/L (ref 37–117)
Anion Gap: 5 (ref 5.0–15.0)
BUN: 24 mg/dL — ABNORMAL HIGH (ref 7.0–21.0)
Bilirubin, Total: 0.5 mg/dL (ref 0.2–1.2)
CO2: 28 mEq/L (ref 17–29)
Calcium: 9.6 mg/dL (ref 7.9–10.2)
Chloride: 106 mEq/L (ref 99–111)
Creatinine: 1.2 mg/dL — ABNORMAL HIGH (ref 0.4–1.0)
Globulin: 2.4 g/dL (ref 2.0–3.6)
Glucose: 166 mg/dL — ABNORMAL HIGH (ref 70–100)
Potassium: 4.1 mEq/L (ref 3.5–5.3)
Protein, Total: 6.3 g/dL (ref 6.0–8.3)
Sodium: 139 mEq/L (ref 135–145)

## 2021-12-13 LAB — CBC AND DIFFERENTIAL
Absolute NRBC: 0 10*3/uL (ref 0.00–0.00)
Basophils Absolute Automated: 0.03 10*3/uL (ref 0.00–0.08)
Basophils Automated: 0.8 %
Eosinophils Absolute Automated: 0.04 10*3/uL (ref 0.00–0.44)
Eosinophils Automated: 1 %
Hematocrit: 39.4 % (ref 34.7–43.7)
Hgb: 12.5 g/dL (ref 11.4–14.8)
Immature Granulocytes Absolute: 0.02 10*3/uL (ref 0.00–0.07)
Immature Granulocytes: 0.5 %
Instrument Absolute Neutrophil Count: 2.45 10*3/uL (ref 1.10–6.33)
Lymphocytes Absolute Automated: 1.1 10*3/uL (ref 0.42–3.22)
Lymphocytes Automated: 28 %
MCH: 28.2 pg (ref 25.1–33.5)
MCHC: 31.7 g/dL (ref 31.5–35.8)
MCV: 88.9 fL (ref 78.0–96.0)
MPV: 12 fL (ref 8.9–12.5)
Monocytes Absolute Automated: 0.29 10*3/uL (ref 0.21–0.85)
Monocytes: 7.4 %
Neutrophils Absolute: 2.45 10*3/uL (ref 1.10–6.33)
Neutrophils: 62.3 %
Nucleated RBC: 0 /100 WBC (ref 0.0–0.0)
Platelets: 141 10*3/uL — ABNORMAL LOW (ref 142–346)
RBC: 4.43 10*6/uL (ref 3.90–5.10)
RDW: 14 % (ref 11–15)
WBC: 3.93 10*3/uL (ref 3.10–9.50)

## 2021-12-13 LAB — GFR: EGFR: 44.3

## 2021-12-13 LAB — HEMOLYSIS INDEX: Hemolysis Index: 5 Index (ref 0–24)

## 2021-12-13 LAB — HEMOGLOBIN A1C
Average Estimated Glucose: 159.9 mg/dL
Hemoglobin A1C: 7.2 % — ABNORMAL HIGH (ref 4.6–5.9)

## 2021-12-14 ENCOUNTER — Telehealth (INDEPENDENT_AMBULATORY_CARE_PROVIDER_SITE_OTHER): Payer: Self-pay

## 2021-12-14 ENCOUNTER — Encounter (INDEPENDENT_AMBULATORY_CARE_PROVIDER_SITE_OTHER): Payer: Self-pay | Admitting: Family Medicine

## 2021-12-14 NOTE — Telephone Encounter (Signed)
Pt currently has a three month supply received from her PCP, but states the prescription should be for one year      Name, strength, directions of requested refill(s):    Creon 36000-114000 units Cap DR Particles     How much medication is remaining: currently has three     Pharmacy to send refill to or patient to pick up rx from office (mark requested pharmacy in BOLD):      [id number] - [place] [address] AT [facility] [address]  [address]  [address]  [place] [address]  Phone: [phone] Fax: [phone]    [facility] - [place], [place] - [address] [facility]  [address] [place]  [place] [address]  Phone: [phone] Fax: [phone]        Please mark "X" next to the preferred call back number:    Mobile:   Telephone Information:   Mobile [phone]       Home: @HOMEPHONE @    Work: @WORKPHONE @        Medication refill request , see above. Thank you     Additional Notes:  Next Visit: MM/DD/YY

## 2021-12-15 ENCOUNTER — Other Ambulatory Visit: Payer: Self-pay | Admitting: Surgery

## 2021-12-15 ENCOUNTER — Other Ambulatory Visit (INDEPENDENT_AMBULATORY_CARE_PROVIDER_SITE_OTHER): Payer: Self-pay

## 2021-12-15 DIAGNOSIS — K8681 Exocrine pancreatic insufficiency: Secondary | ICD-10-CM

## 2021-12-15 MED ORDER — CREON 36000-114000 UNITS PO CPEP
1.0000 | ORAL_CAPSULE | Freq: Four times a day (QID) | ORAL | 12 refills | Status: DC
Start: 2021-12-15 — End: 2022-12-08

## 2021-12-15 MED ORDER — CREON 36000-114000 UNITS PO CPEP
1.0000 | ORAL_CAPSULE | Freq: Four times a day (QID) | ORAL | 12 refills | Status: DC
Start: 2021-12-15 — End: 2021-12-15

## 2021-12-20 LAB — FECAL FAT, QUALITATIVE: Fecal Fat, Qualitative: NORMAL

## 2021-12-23 LAB — STOOL PANCREATIC ELASTASE: Stool Pancreatic Elastase-1: 163 mcg/g — ABNORMAL LOW

## 2021-12-24 LAB — MISCELLANEOUS QUEST TEST

## 2021-12-27 ENCOUNTER — Telehealth (INDEPENDENT_AMBULATORY_CARE_PROVIDER_SITE_OTHER): Payer: Medicare Other | Admitting: Surgery

## 2021-12-29 ENCOUNTER — Encounter (INDEPENDENT_AMBULATORY_CARE_PROVIDER_SITE_OTHER): Payer: Self-pay | Admitting: Family Medicine

## 2022-01-08 ENCOUNTER — Encounter (INDEPENDENT_AMBULATORY_CARE_PROVIDER_SITE_OTHER): Payer: Self-pay

## 2022-01-13 NOTE — Progress Notes (Signed)
[facility] Breast Surgery Breast Cancer Follow Up    Treatment Team  Ref Prov:    Primary Care Physician:   Raj JanusVejcik, Scott M, MD [facility])--[phone] Breast Surgeon : Henderson BaltimoreKirsten Edmiston MD  [phone]   Plastic Surgeon none Radiation Onc:  Miguel DibbleStella Hetelekidis MD:  [phone] [facility]); Medical Onc:  Laney PastorKathleen Harnden, MD [phone] Morene Antu([place])        Breast Cancer Comprehensive Care Plan Summary  Date of diagnosis: 11/03/2018 Event : DCIS ECOG Performance: Grade 0  (Fully active) Genetic Testing :not indicated   Presentation : Abnormal mammogram with microcalcifications Side: left Focality :  unifocal Location: radian 12:00   Biologic Tumor characteristics  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 Ki-67:  DCIS - not indicated Oncotype Dx:  DCIS , not indicated   Estrogen Receptor: positive 19 % Progesterone Receptor: negative Her-2-neu Receptor: DCIS, not indicated Androgen Receptor;  not done   Staging  Tumor Size:   DCIS with <271mm microinvasion Nodes: clinically/US negative Systemic Metastases: clinically negative Stage: Stage0 , Tmic N0 M0   Treatment  Modality Date     Surgery    12/25/18     01/15/2019 left, partial mastectomy with needle localization     Re-excision lumpectomy    Radiation    Whole breast radiation completed on 04/02/2019   Endocrine    not felt to be warranted as the patient was only weakly ER positive   Chemotherapy       Biologic       Clinical trial            HPI   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 female patient here for follow up of her history of left breast cancer originally diagnosed in February 2020.  She is currently on No endocrine therapy    The patient is asymptomatic and denies any persistent breast pain , palpable masses, skin or nipple changes or nipple discharge.     Recent Imaging:  LDM 07/18/19: No mammographic evidence for malignancy. BIRADS2    BDM 11/06/19: Left  lumpectomy changes with No mammographic evidence for malignancy. BIRADS2    BDM 11/06/20: No mammographic evidence for malignancy. BIRADS2    BDM 11/24/21: No mammographic evidence for malignancy. BIRADS2    The following portions of the patient's history were reviewed and updated as appropriate: allergies, current medications, past family history, past medical history, past social history, past surgical history and problem list.     has a past medical history of Anxiety, Arrhythmia, Bilateral cataracts, Chronic kidney disease, CKD (chronic kidney disease), stage III, Diabetes mellitus, type 2 (01/22/2021), Encounter for blood transfusion (2003), Exocrine pancreatic insufficiency, Gastroesophageal reflux disease, Hemorrhagic diathesis (06/2002), Hyperlipidemia, Hypertension, Malignant neoplasm of breast (2020), PAN (polyarteritis nodosa) (2003), and Sleep apnea.    Family History     Family History   Problem Relation Age of Onset    Diabetes Mother     Thyroid disease Mother     No known problems Father         Review of Systems   See scan.     Physical Exam   BP 146/82 (BP Site: Left arm, Patient Position: Sitting)   Pulse 68   Temp 98.1 F (36.7 C) (Oral)   Resp 16   Ht 1.626 m (5\' 4" )   Wt 65.8 kg (145 lb)   SpO2 95%  BMI 24.89 kg/m     WDWN female in NAD  HEENT:  Clear, no scleral icterus, neck supple  Neck:  No thyromegaly or masses, trachea midline  Chest:  Respiratory effort normal  BJM:  Gait and station normal  Ext:  No CCE  Skin:  Free of significant ulcers or lesions  Neuro:  Grossly intact, no focal findings, alert and oriented, asks appropriate questions  LN:  No axillary, supraclavicular or cervical adenopathy  Breast:  Symmetric .    Right breast: No skin or nipple changes, no nipple retraction or discharge . No palpable masses    Left breast : No skin or nipple changes, no nipple retraction or discharge . No palpable masses. Well healed incision.    Rads   Radiological imaging and reports  reviewed as above.     Assessment/Plan   Personal history of breast carcinoma    Patient is doing well. She currently has no evidence of disease .  Her physical and emotional recovery is good. Cosmetic outcome is good .  I have recommended continued treatment with Serial imaging    Surveillance / Folllow up recommendations:    I reviewed the importance of breast exams and follow up at six month intervals for the first five years after the first treatment; alternating exams at 6 month intervals with medical oncology is a good option between years three and five to limit the number of times needed for follow up ; and every year thereafter.  I have recommended long term follow-up because there is a low but measurable risk of breast cancer recurrence even more than 10 years after treatment. In addition, if  there is remaining breast tissue, there is a higher risk to develop a new, independent, breast cancer at some time in the future.    After lumpectomy, we recommend mammograms once a year. The purpose is to monitor for local recurrence and detection of new primary.    Patient is advised to report these symptoms : new lumps, bone pain, chest pain, shortness of breath or difficulty breathing, abdominal pain, or persistent headaches.     The following tests are not recommended for routine breast cancer follow-up: breast MRI, FDG-PET scans, complete blood cell counts, automated chemistry studies, chest x-rays, bone scans, liver ultrasound, and tumor markers (CA 15-3, CA 27.29, CEA).     Nutrition  I have also discussed life-style choices to decrease risk of breast cancer such as:   Healthy eating habits, meals high in fruits, vegetables, nuts and complex carbohydrates, low in animal fats.  Limit the amount of alcohol ingested   Avoid  Hormone Replacement Therapy.    Exercise  Continue to exercise, aerobic 30 - 40 minutes , 3 -4 times / week    Follow up  Continue healthy lifestyle.   Bilateral mammogram in February  2024  Follow up visit with me  In 12 months, sooner if concerns.   Follow up with PCP, GI  Patient agrees with plan.

## 2022-01-14 ENCOUNTER — Ambulatory Visit (INDEPENDENT_AMBULATORY_CARE_PROVIDER_SITE_OTHER): Payer: Medicare Other | Admitting: Family Nurse Practitioner

## 2022-01-14 ENCOUNTER — Encounter (HOSPITAL_BASED_OUTPATIENT_CLINIC_OR_DEPARTMENT_OTHER): Payer: Self-pay | Admitting: Family Nurse Practitioner

## 2022-01-14 VITALS — BP 146/82 | HR 68 | Temp 98.1°F | Resp 16 | Ht 64.0 in | Wt 145.0 lb

## 2022-01-14 DIAGNOSIS — Z853 Personal history of malignant neoplasm of breast: Secondary | ICD-10-CM

## 2022-01-17 ENCOUNTER — Encounter (INDEPENDENT_AMBULATORY_CARE_PROVIDER_SITE_OTHER): Payer: Self-pay | Admitting: Family Medicine

## 2022-01-17 ENCOUNTER — Encounter (INDEPENDENT_AMBULATORY_CARE_PROVIDER_SITE_OTHER): Payer: Self-pay

## 2022-01-17 DIAGNOSIS — M797 Fibromyalgia: Secondary | ICD-10-CM

## 2022-01-18 MED ORDER — FLUTICASONE PROPIONATE 50 MCG/ACT NA SUSP
2.0000 | Freq: Every day | NASAL | 3 refills | Status: DC
Start: 2022-01-18 — End: 2022-01-31

## 2022-01-18 MED ORDER — FREESTYLE LITE TEST VI STRP
ORAL_STRIP | 3 refills | Status: DC
Start: 2022-01-18 — End: 2022-01-31

## 2022-01-18 MED ORDER — LIDOCAINE 5 % EX PTCH
MEDICATED_PATCH | CUTANEOUS | 3 refills | Status: DC
Start: 2022-01-18 — End: 2022-01-31

## 2022-01-18 MED ORDER — PANTOPRAZOLE SODIUM 40 MG PO TBEC
40.0000 mg | DELAYED_RELEASE_TABLET | Freq: Every evening | ORAL | 3 refills | Status: DC
Start: 2022-01-18 — End: 2022-01-31

## 2022-01-18 MED ORDER — METFORMIN HCL ER 500 MG PO TB24
500.0000 mg | ORAL_TABLET | Freq: Every evening | ORAL | 3 refills | Status: DC
Start: 2022-01-18 — End: 2022-05-04

## 2022-01-19 ENCOUNTER — Telehealth (INDEPENDENT_AMBULATORY_CARE_PROVIDER_SITE_OTHER): Payer: Medicare Other

## 2022-01-31 ENCOUNTER — Other Ambulatory Visit (INDEPENDENT_AMBULATORY_CARE_PROVIDER_SITE_OTHER): Payer: Self-pay | Admitting: Family Medicine

## 2022-01-31 DIAGNOSIS — M797 Fibromyalgia: Secondary | ICD-10-CM

## 2022-01-31 MED ORDER — FLUTICASONE PROPIONATE 50 MCG/ACT NA SUSP
2.0000 | Freq: Every day | NASAL | 3 refills | Status: DC
Start: 2022-01-31 — End: 2022-07-13

## 2022-01-31 MED ORDER — LIDOCAINE 5 % EX PTCH
MEDICATED_PATCH | CUTANEOUS | 3 refills | Status: DC
Start: 2022-01-31 — End: 2023-05-24

## 2022-01-31 MED ORDER — METOPROLOL TARTRATE 25 MG PO TABS
25.0000 mg | ORAL_TABLET | Freq: Every day | ORAL | 3 refills | Status: DC
Start: 2022-01-31 — End: 2022-07-13

## 2022-01-31 MED ORDER — PANTOPRAZOLE SODIUM 40 MG PO TBEC
40.0000 mg | DELAYED_RELEASE_TABLET | Freq: Every evening | ORAL | 3 refills | Status: DC
Start: 2022-01-31 — End: 2022-07-13

## 2022-01-31 MED ORDER — FREESTYLE LITE TEST VI STRP
ORAL_STRIP | 3 refills | Status: DC
Start: 2022-01-31 — End: 2022-07-13

## 2022-01-31 NOTE — Telephone Encounter (Signed)
fluticasone (FLONASE) 50 MCG/ACT nasal spray    glucose blood (FREESTYLE LITE) test strip    lidocaine (Lidoderm) 5 %    metFORMIN (GLUCOPHAGE-XR) 500 MG 24 hr tablet    pantoprazole (PROTONIX) 40 MG tablet    [facility] [address] [facility] - [place], [place] - [address]  [address] [place] [address]  Phone: [phone] Fax: [phone]    Called and spoke to patient about the medications already being filled on the 01/18/2022. Patient stated that because the pharmacy is [facility]. The scripts have to be activated. After 2 weeks have passed they will not activate it. Patient would like the script resent and have nurse call her when orders are in. Please advise

## 2022-01-31 NOTE — Telephone Encounter (Signed)
Name, strength, directions of requested refill(s):    fluticasone (FLONASE) 50 MCG/ACT nasal spray    glucose blood (FREESTYLE LITE) test strip    lidocaine (Lidoderm) 5 %    metFORMIN (GLUCOPHAGE-XR) 500 MG 24 hr tablet    pantoprazole (PROTONIX) 40 MG tablet    How much medication is remaining:     **see additional notes below**    Pharmacy to send refill to or patient to pick up rx from office (mark requested pharmacy in BOLD):    [facility] #[id number] - [place], [place] - [address] AT [facility] [address] [facility] [address]  [address]  [address]  [place] [address]  Phone: [phone] Fax: [phone]    [facility] [place] - [place], [place] - [address]  [address] [place] [address]  Phone: [phone] Fax: [phone]    Please mark "X" next to the preferred call back number:    Mobile: [phone] (mobile) x   Home: [phone] (home)    Work: @WORKPHONE @      Medication refill request, see above. Thank you   Patient has been informed that medication refill requests should be called in up to one week prior to running out of medication.    Additional Notes:  Last visit was video 12/10/2021    Please resend these prescriptions to the patient's pharmacy.

## 2022-02-04 DIAGNOSIS — K8681 Exocrine pancreatic insufficiency: Secondary | ICD-10-CM | POA: Insufficient documentation

## 2022-02-09 ENCOUNTER — Ambulatory Visit (FREE_STANDING_LABORATORY_FACILITY): Payer: Medicare Other | Admitting: Family

## 2022-02-09 ENCOUNTER — Encounter (INDEPENDENT_AMBULATORY_CARE_PROVIDER_SITE_OTHER): Payer: Self-pay | Admitting: Family

## 2022-02-09 VITALS — BP 137/75 | HR 70 | Temp 97.7°F | Resp 14 | Ht 64.0 in | Wt 147.0 lb

## 2022-02-09 DIAGNOSIS — N3000 Acute cystitis without hematuria: Secondary | ICD-10-CM

## 2022-02-09 DIAGNOSIS — R300 Dysuria: Secondary | ICD-10-CM

## 2022-02-09 LAB — MCKESSON POCT UA 120
Bilirubin UA: NEGATIVE
Glucose UA: NEGATIVE
Ketone UA: NEGATIVE
Nitrite UA: NEGATIVE
Specific Gravity UA: 1.01
Urobilinogen UA: NEGATIVE
pH UA: 6

## 2022-02-09 MED ORDER — CEPHALEXIN 500 MG PO CAPS
500.0000 mg | ORAL_CAPSULE | Freq: Two times a day (BID) | ORAL | 0 refills | Status: AC
Start: 2022-02-09 — End: 2022-02-16

## 2022-02-09 NOTE — Progress Notes (Signed)
Urgent Care Provider Note    Patient: Katelyn Caldwell   Date: 02/09/2022   MRN: [id number]       Subjective     Chief Complaint   Patient presents with    Dysuria     Patient here with c/o dysuria x 1 day.          HPI    Katelyn Caldwell is a 71 y.o. female who presents with mild which started yesterday and has been stable. Patient admits to dysuria, urinary frequency, and urinary urgency. Patient denies back pain, fever , chills, vomiting , and vaginal discharge. Last UTI in January.     No LMP recorded. Patient is postmenopausal.    Pertinent Past Medical, Surgical, Family and Social History were reviewed.      Current Outpatient Medications:     atorvastatin (LIPITOR) 40 MG tablet, 80 mg, Disp: , Rfl:     Creon 36000-114000 units Cap DR Particles, Take 1 capsule by mouth 4 (four) times daily, Disp: 120 capsule, Rfl: 12    cyanocobalamin ([employer] VITAMIN B12) 1000 MCG tablet, Take 0.5 tablets (500 mcg total) by mouth daily, Disp: 45 tablet, Rfl: 3    fluticasone (FLONASE) 50 MCG/ACT nasal spray, 2 sprays by Nasal route daily, Disp: 16 g, Rfl: 3    glucose blood (FREESTYLE LITE) test strip, Check blood sugar once daily, Disp: 100 strip, Rfl: 3    Lancets (freestyle) lancets, Check blood sugar once daily, Disp: 100 each, Rfl: 3    lidocaine (Lidoderm) 5 %, Place 1 patch onto affected skin every twelve hours as needed for discomfort.  Remove each patch after 12 hours of use, Disp: 30 patch, Rfl: 3    losartan (COZAAR) 25 MG tablet, Take 1 tablet (25 mg total) by mouth daily, Disp: 90 tablet, Rfl: 3    Magnesium 400 MG Tab, Take 400 mg by mouth nightly Patient taking half dose, Disp: , Rfl:     metFORMIN (GLUCOPHAGE-XR) 500 MG 24 hr tablet, Take 1 tablet (500 mg) by mouth nightly, Disp: 90 tablet, Rfl: 3    metoprolol tartrate (LOPRESSOR) 25 MG tablet, Take 1 tablet (25 mg) by mouth daily, Disp: 90 tablet, Rfl: 3    pantoprazole (PROTONIX) 40 MG tablet, Take 1 tablet (40 mg) by mouth every evening, Disp: 90 tablet, Rfl:  3    Probiotic Product (PROBIOTIC-10 PO), Take by mouth every other day , Disp: , Rfl:     SITagliptin-MetFORMIN HCl (Janumet XR) 50-500 MG Tablet SR 24 hr, Take 1 tablet by mouth 2 (two) times daily with meals, Disp: 180 tablet, Rfl: 3    vitamin D (CHOLECALCIFEROL) 25 MCG (1000 UT) tablet, Take 1 tablet (1,000 Units total) by mouth daily (Patient taking differently: Take 500 Units by mouth daily), Disp:  , Rfl:     cephALEXin (KEFLEX) 500 MG capsule, Take 1 capsule (500 mg) by mouth 2 (two) times daily for 7 days, Disp: 14 capsule, Rfl: 0    Allergies   Allergen Reactions    Ciprofloxacin Other (See Comments)     Severe Connective Tissue (joint) pain and stiffness    Nsaids Other (See Comments)     Pt states this causes her bleeding d/t PAN       Medications and Allergies reviewed.         Objective     Vitals:    02/09/22 1159   BP: 137/75   Pulse: 70   Resp: 14   Temp:  97.7 F (36.5 C)   SpO2: 98%       Physical Exam    General: no acute distress, well developed, well nourished.    Heart: regular rate    Lungs: normal work of breathing    Abdomen: no CVA tenderness     Back: No CVA tenderness    UCC COURSE  LABS  The following POCT tests were ordered, reviewed and discussed with the patient/family.     Results       Procedure Component Value Units Date/Time    UA [[id number]]  (Abnormal) Collected: 02/09/22 1226    Specimen: Clean Catch Updated: 02/09/22 1228     Color, UA Yellow     Clarity, UA Cloudy     Leukocytes UA 3+ (500 Leu/ul)     Nitrite UA Negative     Urobilinogen UA Negative (0.2 mg/dl)     Protein UA Trace (15mg /dl)     pH UA 6.0     Blood UA Trace (10 Ery/ul)     Specific Gravity UA 1.010     Ketone UA Negative     Bilirubin UA Negative     Glucose UA Negative            There were no x-rays reviewed with this patient during the visit.    No current facility-administered medications for this visit.             Procedures    MDM:  Findings c/w urinary tract infection  UA c/w infection  Urine cx  ordered  Keflex rx'd   Increase PO fluids to maintain adequate hydration  Report to ED if any flank pain, abd pain, nausea/vomiting, fevers >100.4, or any concerning/worsening symptoms  Patient verbalized understanding of information and had no further questions/concerns      Assessment     Katelyn Caldwell was seen today for dysuria.    Diagnoses and all orders for this visit:    Acute cystitis without hematuria    Dysuria  -     UA  -     Urine culture    Other orders  -     cephALEXin (KEFLEX) 500 MG capsule; Take 1 capsule (500 mg) by mouth 2 (two) times daily for 7 days        Plan and follow-up discussed with patient. See AVS for further documentation.

## 2022-02-11 NOTE — Progress Notes (Signed)
Please notify patient that their urine culture is positive. They should finish course of antibiotics

## 2022-02-21 ENCOUNTER — Encounter (INDEPENDENT_AMBULATORY_CARE_PROVIDER_SITE_OTHER): Payer: Self-pay | Admitting: Family Medicine
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
## 2022-03-01 ENCOUNTER — Encounter (INDEPENDENT_AMBULATORY_CARE_PROVIDER_SITE_OTHER): Payer: Self-pay | Admitting: Family Medicine

## 2022-03-01 ENCOUNTER — Telehealth (INDEPENDENT_AMBULATORY_CARE_PROVIDER_SITE_OTHER): Payer: Medicare Other | Admitting: Family Medicine

## 2022-03-01 DIAGNOSIS — H259 Unspecified age-related cataract: Secondary | ICD-10-CM

## 2022-03-01 DIAGNOSIS — H43819 Vitreous degeneration, unspecified eye: Secondary | ICD-10-CM

## 2022-03-01 NOTE — Progress Notes (Signed)
Chief Complaint   Patient presents with    cataracts     Referral requested       Verbal consent has been obtained from the patient to conduct a video and telephone visit to minimize exposure to COVID-19: yes    S:      Problem   Cataract    5/23:  Hx cataract surgery last year.  Now returning scar tissue.  Due for laser treatment and would like to follow up with ophthalmologist.             Allergies   Allergen Reactions    Ciprofloxacin Other (See Comments)     Severe Connective Tissue (joint) pain and stiffness    Nsaids Other (See Comments)     Pt states this causes her bleeding d/t PAN       Current Outpatient Medications   Medication Sig Dispense Refill    atorvastatin (LIPITOR) 40 MG tablet 80 mg      Creon 36000-114000 units Cap DR Particles Take 1 capsule by mouth 4 (four) times daily 120 capsule 12    cyanocobalamin ([employer] VITAMIN B12) 1000 MCG tablet Take 0.5 tablets (500 mcg total) by mouth daily 45 tablet 3    fluticasone (FLONASE) 50 MCG/ACT nasal spray 2 sprays by Nasal route daily 16 g 3    glucose blood (FREESTYLE LITE) test strip Check blood sugar once daily 100 strip 3    Lancets (freestyle) lancets Check blood sugar once daily 100 each 3    lidocaine (Lidoderm) 5 % Place 1 patch onto affected skin every twelve hours as needed for discomfort.  Remove each patch after 12 hours of use 30 patch 3    losartan (COZAAR) 25 MG tablet Take 1 tablet (25 mg total) by mouth daily 90 tablet 3    Magnesium 400 MG Tab Take 400 mg by mouth nightly Patient taking half dose      metFORMIN (GLUCOPHAGE-XR) 500 MG 24 hr tablet Take 1 tablet (500 mg) by mouth nightly 90 tablet 3    metoprolol tartrate (LOPRESSOR) 25 MG tablet Take 1 tablet (25 mg) by mouth daily 90 tablet 3    pantoprazole (PROTONIX) 40 MG tablet Take 1 tablet (40 mg) by mouth every evening 90 tablet 3    Probiotic Product (PROBIOTIC-10 PO) Take by mouth every other day        SITagliptin-MetFORMIN HCl (Janumet XR) 50-500 MG Tablet SR 24 hr Take 1  tablet by mouth 2 (two) times daily with meals 180 tablet 3    vitamin D (CHOLECALCIFEROL) 25 MCG (1000 UT) tablet Take 1 tablet (1,000 Units total) by mouth daily (Patient taking differently: Take 500 Units by mouth daily)       No current facility-administered medications for this visit.       ROS    All other systems were reviewed and are negative    O:  There were no vitals taken for this visit., There is no height or weight on file to calculate BMI.  Vital signs reviewed  GEN:  NAD, nonobese  NEURO:  CN2-12 without focal defecit  PULM:  unlabored respirations  SKIN:  dry, no visible rash    A/P:     1. Age-related cataract of both eyes, unspecified age-related cataract type  Referral to Ophthalmology - EXTERNAL      2. Vitreous degeneration, unspecified laterality  Referral to Ophthalmology - EXTERNAL          Time spent in discussion:  10 minutes    Risk & Benefits of the new medication(s) were explained to the patient (and family) who verbalized understanding & agreed to the treatment plan. Patient (family) encouraged to contact me/clinical staff with any questions/concerns    Raj Janus, MD

## 2022-03-02 NOTE — Progress Notes (Signed)
No referral Required

## 2022-03-14 ENCOUNTER — Ambulatory Visit (INDEPENDENT_AMBULATORY_CARE_PROVIDER_SITE_OTHER): Payer: Medicare Other | Admitting: Nurse Practitioner

## 2022-03-14 VITALS — BP 128/73 | HR 69 | Temp 97.9°F | Resp 17 | Ht 64.0 in | Wt 146.0 lb

## 2022-03-14 DIAGNOSIS — R8290 Unspecified abnormal findings in urine: Secondary | ICD-10-CM

## 2022-03-14 DIAGNOSIS — B9620 Unspecified Escherichia coli [E. coli] as the cause of diseases classified elsewhere: Secondary | ICD-10-CM

## 2022-03-14 DIAGNOSIS — E119 Type 2 diabetes mellitus without complications: Secondary | ICD-10-CM

## 2022-03-14 DIAGNOSIS — D696 Thrombocytopenia, unspecified: Secondary | ICD-10-CM

## 2022-03-14 DIAGNOSIS — M797 Fibromyalgia: Secondary | ICD-10-CM

## 2022-03-14 DIAGNOSIS — N1831 Chronic kidney disease, stage 3a: Secondary | ICD-10-CM

## 2022-03-14 DIAGNOSIS — D0512 Intraductal carcinoma in situ of left breast: Secondary | ICD-10-CM

## 2022-03-14 DIAGNOSIS — I517 Cardiomegaly: Secondary | ICD-10-CM

## 2022-03-14 DIAGNOSIS — R3915 Urgency of urination: Secondary | ICD-10-CM

## 2022-03-14 DIAGNOSIS — Z01818 Encounter for other preprocedural examination: Secondary | ICD-10-CM

## 2022-03-14 DIAGNOSIS — Z9289 Personal history of other medical treatment: Secondary | ICD-10-CM

## 2022-03-14 DIAGNOSIS — M300 Polyarteritis nodosa: Secondary | ICD-10-CM

## 2022-03-14 DIAGNOSIS — Z9989 Dependence on other enabling machines and devices: Secondary | ICD-10-CM

## 2022-03-14 DIAGNOSIS — Z923 Personal history of irradiation: Secondary | ICD-10-CM

## 2022-03-14 DIAGNOSIS — Z6825 Body mass index (BMI) 25.0-25.9, adult: Secondary | ICD-10-CM

## 2022-03-14 DIAGNOSIS — K8681 Exocrine pancreatic insufficiency: Secondary | ICD-10-CM

## 2022-03-14 DIAGNOSIS — N390 Urinary tract infection, site not specified: Secondary | ICD-10-CM

## 2022-03-14 DIAGNOSIS — I10 Essential (primary) hypertension: Secondary | ICD-10-CM

## 2022-03-14 DIAGNOSIS — J301 Allergic rhinitis due to pollen: Secondary | ICD-10-CM

## 2022-03-14 DIAGNOSIS — G4733 Obstructive sleep apnea (adult) (pediatric): Secondary | ICD-10-CM

## 2022-03-14 DIAGNOSIS — Z8719 Personal history of other diseases of the digestive system: Secondary | ICD-10-CM

## 2022-03-14 DIAGNOSIS — E1165 Type 2 diabetes mellitus with hyperglycemia: Secondary | ICD-10-CM

## 2022-03-14 DIAGNOSIS — I071 Rheumatic tricuspid insufficiency: Secondary | ICD-10-CM

## 2022-03-14 DIAGNOSIS — R6889 Other general symptoms and signs: Secondary | ICD-10-CM

## 2022-03-14 LAB — CBC AND DIFFERENTIAL
Absolute NRBC: 0 10*3/uL (ref 0.00–0.00)
Basophils Absolute Automated: 0.03 10*3/uL (ref 0.00–0.08)
Basophils Automated: 0.6 %
Eosinophils Absolute Automated: 0.05 10*3/uL (ref 0.00–0.44)
Eosinophils Automated: 1.1 %
Hematocrit: 37.8 % (ref 34.7–43.7)
Hgb: 12 g/dL (ref 11.4–14.8)
Immature Granulocytes Absolute: 0.02 10*3/uL (ref 0.00–0.07)
Immature Granulocytes: 0.4 %
Instrument Absolute Neutrophil Count: 3.15 10*3/uL (ref 1.10–6.33)
Lymphocytes Absolute Automated: 1.09 10*3/uL (ref 0.42–3.22)
Lymphocytes Automated: 23.1 %
MCH: 28.2 pg (ref 25.1–33.5)
MCHC: 31.7 g/dL (ref 31.5–35.8)
MCV: 88.7 fL (ref 78.0–96.0)
MPV: 12.2 fL (ref 8.9–12.5)
Monocytes Absolute Automated: 0.38 10*3/uL (ref 0.21–0.85)
Monocytes: 8.1 %
Neutrophils Absolute: 3.15 10*3/uL (ref 1.10–6.33)
Neutrophils: 66.7 %
Nucleated RBC: 0 /100 WBC (ref 0.0–0.0)
Platelets: 137 10*3/uL — ABNORMAL LOW (ref 142–346)
RBC: 4.26 10*6/uL (ref 3.90–5.10)
RDW: 14 % (ref 11–15)
WBC: 4.72 10*3/uL (ref 3.10–9.50)

## 2022-03-14 LAB — COMPREHENSIVE METABOLIC PANEL
ALT: 11 U/L (ref 0–55)
AST (SGOT): 16 U/L (ref 5–41)
Albumin/Globulin Ratio: 1.5 (ref 0.9–2.2)
Albumin: 3.8 g/dL (ref 3.5–5.0)
Alkaline Phosphatase: 88 U/L (ref 37–117)
Anion Gap: 11 (ref 5.0–15.0)
BUN: 28 mg/dL — ABNORMAL HIGH (ref 7.0–21.0)
Bilirubin, Total: 0.4 mg/dL (ref 0.2–1.2)
CO2: 19 mEq/L (ref 17–29)
Calcium: 8.8 mg/dL (ref 7.9–10.2)
Chloride: 107 mEq/L (ref 99–111)
Creatinine: 1.2 mg/dL — ABNORMAL HIGH (ref 0.4–1.0)
Globulin: 2.6 g/dL (ref 2.0–3.6)
Glucose: 228 mg/dL — ABNORMAL HIGH (ref 70–100)
Potassium: 5.4 mEq/L — ABNORMAL HIGH (ref 3.5–5.3)
Protein, Total: 6.4 g/dL (ref 6.0–8.3)
Sodium: 137 mEq/L (ref 135–145)
eGFR: 48 mL/min/{1.73_m2} — AB (ref >=60)

## 2022-03-14 LAB — ECG 12-LEAD
Atrial Rate: 66 {beats}/min
P-R Interval: 178 ms
Q-T Interval: 434 ms
QRS Duration: 98 ms
QTC Calculation (Bezet): 454 ms
T Axis: 75 degrees

## 2022-03-14 LAB — URINALYSIS REFLEX TO MICROSCOPIC EXAM - REFLEX TO CULTURE
Bilirubin, UA: NEGATIVE
Blood, UA: NEGATIVE
Glucose, UA: NEGATIVE
Ketones UA: NEGATIVE
Nitrite, UA: NEGATIVE
Specific Gravity UA: 1.008 (ref 1.001–1.035)
Urine pH: 5 (ref 5.0–8.0)
Urobilinogen, UA: NORMAL mg/dL

## 2022-03-14 LAB — HEMOGLOBIN A1C
Average Estimated Glucose: 157.1 mg/dL
Hemoglobin A1C: 7.1 % — ABNORMAL HIGH (ref 4.6–5.6)

## 2022-03-14 LAB — HEMOLYSIS INDEX: Hemolysis Index: 13 Index (ref 0–24)

## 2022-03-14 NOTE — PSS Phone Screening (Addendum)
Subjective A 71 y/o female scheduled for EUS with Dr. Linde Gillis on 6/28.    Background Patient presented to [facility] on 6/12 for pre-operative evaluation.    Assessment Labs done 3/13 and 5/10 in Epic.    Recommendation Pr [facility] NP, patient is an elevated but acceptable risk to proceed with planned surgery.

## 2022-03-14 NOTE — Discharge Instructions (Signed)
[place]  PRE-PROCEDURAL EVALUATION CLINIC AFTER VISIT SUMMARY     Katelyn Caldwell, Katelyn Caldwell  DOB:  1950-12-10  MRN:  [id number]     You were seen 03/14/22 by Hoy Finlay, NP, an [facility] Pre-Procedural Evaluation [facility] Advanced Practice Provider, regarding your EUS (ENDOSCOPIC ULTRASOUND).       Your case is currently scheduled for 03/30/2022 with Arlice Colt, MD. [name] will get a phone call by our PSS nurses the day before surgery to provide the most up to date scheduled surgery time. You will need to arrive two hours prior to the scheduled start time.    Fasting Instructions for Procedures Requiring Anesthesia or Sedation     Solids Clear Liquids   or Ice Chips   No solids OR dairy after 11pm the night before surgery You may have "clear" liquids or ice chips up to 2 hours prior to your specified arrival time (4 hours prior to your scheduled procedure time).    Examples of clear liquids include water, apple juice, sports drinks such as Gatorade, coffee or tea without cream or milk. Sugar or sweetener may be added.       If there is any change to this time, your surgeon's office will contact you directly.     If you need to cancel or postpone your procedure, please call your surgeon's office immediately.      MEDICATION INSTRUCTIONS  Below are the instructions reviewed with you today regarding your medications, along with any specific instructions or restrictions prior to your procedure.  In addition to these instructions, remember that you should avoid vitamins, supplements, herbal and green teas and over-the-counter pain medications (ibuprofen, naproxen, etc.) for at least 7 days prior to your surgery.  If you take aspirin, please consult with the ordering prescriber and your surgeon regarding if and when you should stop aspirin.      QUESTIONS?  If you have any questions prior to your procedure, please call [phone] and leave a message.  This voice mail is monitored between 7am and 3pm Monday through  Friday.    Our goal is to ensure you are safe for your upcoming procedure and to give you easy to understand instructions about getting ready for your procedure.

## 2022-03-14 NOTE — [facility] In-Person Visit (H&P) (Signed)
Pre-Anesthesia Evaluation     Pre-op Interview visit requested by:   Reason for pre-op interview visit: Patient anticipating EUS (ENDOSCOPIC ULTRASOUND) procedure.    History of Present Illness/Summary:  Patient presents to the [facility] clinic for a pre-operative evaluation.    Assessment/Plan:    1.  Preop examination [Z01.818]    Orders Placed This Encounter   Procedures    Urine culture    Comprehensive metabolic panel    Urinalysis Reflex to Microscopic Exam- Reflex to Culture    CBC and differential    Hemoglobin A1C    Hemolysis index    ECG 12 lead       2.  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  E Coli UTI/ urinary urgency   -UA: large leukocytes, 26-50 WBC. Urine culture shows >100k E coli, Keflex BID x 7 days sent to pharmacy.    4.  PAN/ CKD Stage 3a   -Dx with PAN in 2003. Creat: 1.2, GFR: 48 on 03/14/22. Followed by Dr. Zella Ball, [place].    5. Mild concentric LVH/ hypokinesis/ trace tricuspid regurgitation    -See ECHO, EF 55%. Denies CP or SOB. Euvolemic. Followed by Cardiology, Dr. Aneta Mins.    6. DMII   -A1c: 7.1 on 03/14/22. On Janumet-instructed to hold morning of surgery and  Metformin-continue in evenings.Fasting blood glucose: 110-130s.  Followed by Endocrine, Dr. Macon Large.    7. HTN   -Stable. On metoprolol-continue in evenings and losartan-instructed to hold morning of surgery. BP at home 110-120s/70s.     8. HLD   -On atorvastatin-continue as prescribed in evenings.    9. Breast cancer/ hx radiation therapy   -S/p left lumpectomy with radiation therapy.    10. Thrombocytopenia   -2/2 PAN. Plt: 137 on 03/14/22, prior 141 on 12/13/21.     11. Hx blood transfusion   -x 1 unit RBC when first diagnosed with PAN. No transfusion reaction.    12. OSA on CPAP   -Uses CPAP, setting: 7. Discussed increased risk for perioperative cardiopulmonary complications r/t OSA, specifically within the first 72 hours post-op.  Recommend monitoring per hospital OSA protocol  perioperatively.       Pre-Operative Evaluation:  -03/14/22 ECG:SR, 66  -09/09/16 ECHO:LV normal, mild concentric LVH. Normal LV systolic function. EF 55%. Basal inferoseptal and inferior segments appear hypokinetic. RV normal, normal systolic function. LA/RA normal. Trace tricuspid regurgitation.  -Exercise tolerance >4 METS (golf, elliptical 5x weekly)  -Patient is at elevated risk for perioperative CV complications 2/2  BMI, fibromyalgia, mild concentric LVH, hypokinesis, trace tricuspid regurgitation, DMII, HTN, HLD, OSA on CPAP,  hx left breast cancer s/p lumpectomy with radiation, PAN, thrombocytopenia, hx blood transfusion reaction, and CKD Stage 3a;  however, patient is at acceptable risk to proceed with the planned surgery.        Patient voiced understanding of all instructions.  All questions and concerns addressed at this time. This assessment will be conveyed to the surgery and anesthesiology teams & the patient will be evaluated the morning of surgery.    Problem List:  Medical Problems       Hospital Problem List  Date Reviewed: 03/16/2022   None        Non-Hospital Problem List  Date Reviewed: 03/16/2022            ICD-10-CM Priority Class Noted    Exocrine pancreatic insufficiency K86.81 High  10/07/2021    Overview Addendum 03/15/2022  6:05 PM by  Hoy Finlay, NP     Scheduled for procedure on 03/30/22    1/23:  Dx in 2/22.  Had been following weith Dr. Arneta Cliche who is nowe retiring.  Will need new referral.  3/23:  Patient has requested a new prescription from GI about 1 month prior and has yet to have the medication prescribed.  Despite multiple attempts to communicate with them she is going to run out of her medication.             Chronic kidney disease, stage III (moderate) N18.30 Medium  03/31/2005    Overview Addendum 10/07/2021 10:39 AM by Raj Janus, MD     relatively stable CKD stage 3 w/ sCr ranging from 1.0 to 1.3 since 2003.  eGFR= 48.  likley etiology secondary to above PAN as  prior to Aug03 (renopulmonary syndrome) sCr 0.8 (2001).    11/2018:  Being followed by nephrology.  Potassium continues to run higher.  On losartan.  Eats a diet rich in potassium.  Hydrates ok but could do better.  1 caffeine daily and 3-5 glasses of wine weekly.    05/2019:  Stays well hydrated.  Has noted some foam in her urine. Due for renal check.  Exercise can be complicated by recovery and fatigue.  Eating well.    1/23:  Following nephrology.  Fairly stable levels.               Type 2 diabetes mellitus without complication, without long-term current use of insulin E11.9 Medium  07/27/2006    Overview Addendum 10/07/2021 10:01 AM by Raj Janus, MD     Hx gestational diabetes.  Last eye exam about 2015.  On cozaar.  On statin.  Avoids ASA due to PAN.  Last HgBA1c in 05/2015.  05/2018:  No medication issues.  Due for A1c.  12/22:  Due for A1c.  Last noted about 1 month prior and elevated and has been elevated past year.  Losing weight 10-12 pounds over about 1 year without intention.             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  S/p chemotx.  Stopped celcept in 2008.  CKD level 3 is due to the PAN.               Thrombocytopenia D69.6 Medium  06/30/2011    Overview Signed 04/25/2016 10:43 AM by Raj Janus, MD     Secondary to PAN           Essential hypertension I10 Medium  04/25/2016    Overview Addendum 10/07/2021 10:40 AM by Raj Janus, MD     Recently with elevation in her evening BP to ~160-170 SBP despite good hydration and using cozaar regularly.  No smoking.  No NSAIDs.  No excessive EtOH.  Has reduced sodium but to uncertain amount.  Exercising (walking) seems to help.  No decongestants.  Has used cozaar with additional dose with some benefit and we are looking to make that a routine.  1/23:  Reports normal levels at home.  A bit elevated here today.           Ductal carcinoma in situ (DCIS) of left  breast D05.12 Medium  11/13/2018    Overview Addendum 01/22/2021 11:02 AM by Raj Janus, MD     11/2018:  Pt here  with husband to discuss recently diagnosed cancer.  Years prior with calcification in same area of same breast.  No fhx breast CA.  Not a smoker.  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    4/22:  2 years NED.  coitinues to follow with breast specialist annually.             Fibromyalgia M79.7 Medium  05/21/2019    Overview Signed 05/21/2019  2:00 PM by Raj Janus, MD     Pt. with poor sleep in last 2 months since starting back to work; will try 10 days of ambien to regulate sleep, which will help fibromyalgia symptoms.  Pt. with poor sleep in last 2 months since starting back to work; will try 10 days of ambien to regulate sleep, which will help fibromyalgia symptoms.           E. 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 Normal LV systolic function. EF 55%. Basal inferoseptal and inferior segments appear hypokinetic. RV normal, normal systolic function. LA/RA normal. Trace tricuspid regurgitation.         Hypokinesis R68.89 Medium  03/15/2022    Overview Signed 03/15/2022  6:10 PM by Hoy Finlay, NP     09/09/16 ECHO:LV normal, mild concentric LVH. Normal LV systolic function. EF 55%. Basal inferoseptal and inferior segments appear hypokinetic. RV normal, normal systolic function. LA/RA normal. Trace tricuspid regurgitation.         Trace tricuspid regurgitation by prior echocardiogram I07.1 Medium  03/15/2022    Overview Signed 03/15/2022  6:11 PM by Hoy Finlay, NP     09/09/16 ECHO:LV normal, mild concentric LVH. Normal LV systolic function. EF 55%. Basal inferoseptal and inferior segments appear hypokinetic. RV normal, normal systolic  function. LA/RA normal. Trace tricuspid regurgitation.         BMI 25.0-25.9,adult Z68.25 Medium  03/15/2022    History of radiation therapy Z92.3 Medium  03/15/2022    History of blood transfusion Z92.89 Medium  03/15/2022    Overview Signed 03/15/2022  6:22 PM by Hoy Finlay, NP     2003, NO TRANSFUSION REACTION           OSA on CPAP G47.33, Z99.89 Medium  03/15/2022    Allergic rhinitis J30.9 Low  12/18/2006    Overview Deleted 03/15/2022  6:27 PM by Hoy Finlay, NP              Adhesive capsulitis of shoulder M75.00   10/19/2011    Overview Addendum 09/18/2017 10:55 AM by Raj Janus, MD     Left shoulder with some limited ROM.  Stable with slight limits in ROM.           Gastroesophageal reflux disease without esophagitis K21.9   12/06/2006    Overview Addendum 09/18/2017 10:56 AM by Raj Janus, MD     Pt uses protonix 40 mg daily.  Coffee 2 cups daily.  Not a smoker.  Red wine 2 times weekly.  Stable.             Trochanteric bursitis of left hip M70.62   07/27/2016    Overview Addendum 10/09/2018  2:57 PM by Raj Janus, MD     Pt notes  recurrent issue of pain over left hip.  She has tried warm compresses without relief.  Notes worse with lawn work.  No fevers, rashes, tic bites.  Recently with some decreased renal function and must avoid NSAID tx.  No limitations in ROM.           Trigger finger of right hand, unspecified finger M65.30   05/29/2018    Overview Signed 05/29/2018  3:11 PM by Raj Janus, MD     Noted on right ring finger.  No pain.  Tried massage without benefit.  Avoids NSAIDs due to hx of renal disease.             Breast calcifications R92.1   01/01/2019    Arthralgia of hip M25.559   05/21/2019    Astigmatism H52.209   05/21/2019    Pes anserinus bursitis M70.50   05/21/2019    Overview Signed 05/21/2019  2:00 PM by Raj Janus, MD     Left anserine bursa injected with kenalog with no complications and improvement of pain.  Left anserine bursa injected with  kenalog with no complications and improvement of pain.           Other psoriasis L40.8   05/21/2019    H/O bilateral cataract extraction Z98.41, Z98.42   01/08/2020    Overview Signed 03/01/2022  4:23 PM by Raj Janus, MD     5/23:  Hx cataract surgery last year.  Now returning scar tissue.  Due for laser treatment and would like to follow up with ophthalmologist.             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  2/23:  On vit d supplement.  Has hx of hypercalcemia and has reduced to 500 IU daily.  No kidney stones.  Has been on cellcept and cytoxin in the past.  Femur density is actually better than 10 years prior.             History of diverticulitis Z87.19   03/15/2022        Medical History   Diagnosis Date    Abnormal vision     readers    Anxiety     no meds    Arrhythmia     PVCs    Bilateral cataracts     bil eye surgery    Chronic kidney disease     Stage 3 CKD - d/t PAN    CKD (chronic kidney disease), stage III     Diabetes mellitus, type 2 01/22/2021    A1C = 7, FBS = 125    Encounter for blood transfusion 2003    when dx with PAN    Exocrine pancreatic insufficiency     EPI     Fracture     right big toe, right hand, left humerus fx    Gastroesophageal reflux disease     Hemorrhagic diathesis 06/2002    PAN diagnosed at this time    Hypercalcemia 05/29/2018    Noted recently with renal labs given hx of renal disease.  Stopped vitamin d and calcium supplements being taken at that time about  1 month ago.  No hx renal stones, no psychosis, no abdominal pain.  Has occurred prior with normal ionized calcium on review.   1/23:  Last evaluation including vitamin D normal about 1 month ago.  Has hx of renal disease and follows this issue with nephrology  as well    Hyperlipidemia     Hypertension     130's over 80's per pt    Irritable bowel syndrome     Low back pain     Malignant neoplasm of breast 2020    L side, radiation June 2020 - lumpectomies    Palpitations 05/21/2019    PAN (polyarteritis nodosa) 2003    cause of CKD    Premature ventricular contractions (PVCs) (VPCs) 09/09/2016    PT recently evaluated by cardiology and underwent stress test and echocardiogram only noted for mild septal thickening and rare PVC.  Had palpitations which have reduced with initiation of toprol.  Pt notes at [facility] for chest pain and URI and underwent EKG. 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  She feels fine. Denies chest pain.  Sx resolved sponta    Psoriasis     Renal insufficiency     stage 3 2/2 pan    Sleep apnea     CPAP nightly.     Past Surgical History:   Procedure Laterality Date    ABDOMINAL SURGERY  1983    C- section    BIOPSY, BREAST, TUMOR EXCISION, ULTRASOUND NEEDLE LOCALIZATION Left 12/25/2018    Procedure: BIOPSY, BREAST, TUMOR EXCISION, ULTRASOUND NEEDLE LOCALIZATION;  Surgeon: Boykin Peek, MD;  Location: [facility];  Service: General;  Laterality: Left;    BLADDER SUSPENSION  cystoscopy    benign    BREAST, LUMPECTOMY Left 12/25/2018    Procedure: BREAST, LUMPECTOMY;  Surgeon: Boykin Peek, MD;  Location: [facility] OR;  Service: General;  Laterality: Left;  LEFT BREAST LUMPECTOMY WITH ULTRASOUND GUIDED WIRE PLACEMENT IN OR BY MD    BREAST, LUMPECTOMY Left 01/15/2019    Procedure: BREAST, LUMPECTOMY;  Surgeon: Boykin Peek, MD;  Location: [facility];  Service: General;  Laterality: Left;  LEFT BREAST LUMPECTOMY ( RE-EXCISION)    CATARACT EXTRACTION Bilateral     CESAREAN SECTION      1983    CYSTOSCOPY, BLADDER BIOPSY      EGD, COLONOSCOPY N/A 04/07/2021    Procedure: EGD, COLONOSCOPY;  Surgeon: Nyoka Cowden, MD;  Location: [facility];  Service: Gastroenterology;   Laterality: N/A;  EGD, COLONOSCOPY  Asst=N    EXCISION BIOPSY WITH NEEDLE LOCALIZATION  2012    left breast, benign    LIFT, ARM (MEDICAL)  2012    Reconstractiv surgery from [facility] accident.    RADIOFREQUENCY SPECTROSCOPY INTRAOPERATIVE MARGIN ASSESSMENT Left 12/25/2018    Procedure: RADIOFREQUENCY SPECTROSCOPY INTRAOPERATIVE MARGIN ASSESSMENT;  Surgeon: Boykin Peek, MD;  Location: [facility] OR;  Service: General;  Laterality: Left;    RENAL BIOPSY  2003    SINUS SURGERY  06/2017        Medication List            Accurate as of March 14, 2022 11:59 PM. Always use your most recent med list.                atorvastatin 40 MG tablet  Take 2 tablets (80 mg) by mouth nightly  Commonly known as: LIPITOR  Medication Adjustments for Surgery: Take as prescribed  Notes to patient: NIGHT     cephALEXin 500 MG capsule  Take 1 capsule (500 mg) by mouth 2 (two) times daily for 5 days  Commonly known as: KEFLEX     Creon 36000-114000 units Cpep  Take 1 capsule by mouth 4 (four) times daily  Generic drug: Pancrelipase (Lip-Prot-Amyl)  Medication Adjustments for Surgery: Hold day of surgery  Notes to patient: DO NOT TAKE MORNING OF SURGERY     [employer] VITAMIN B12 1000 MCG tablet  Take 0.5 tablets (500 mcg total) by mouth daily  Generic drug: cyanocobalamin  Medication Adjustments for Surgery: Stop 7 days before surgery  Notes to patient: LAST DAY: 03/22/22, RESUME AFTER SURGERY     fluticasone 50 MCG/ACT nasal spray  2 sprays by Nasal route daily  Commonly known as: FLONASE  Medication Adjustments for Surgery: Take as prescribed  Notes to patient: NIGHT     freestyle lancets  Check blood sugar once daily     FREESTYLE LITE test strip  Check blood sugar once daily  Generic drug: glucose blood     Janumet XR 50-500 MG Tb24  Take 1 tablet by mouth 2 (two) times daily with meals  Generic drug: SITagliptin-MetFORMIN HCl  Medication Adjustments for Surgery: Hold day of surgery  Notes to patient: DO NOT TAKE MORNING OF SURGERY      lidocaine 5 %  Place 1 patch onto affected skin every twelve hours as needed for discomfort.  Remove each patch after 12 hours of use  Commonly known as: Lidoderm  Medication Adjustments for Surgery: Take as needed  Notes to patient: AS NEEDED, REMOVE ON MORNING OF SURGERY     losartan 25 MG tablet  Take 1 tablet (25 mg total) by mouth daily  Commonly known as: COZAAR  Medication Adjustments for Surgery: Hold day of surgery  Notes to patient: DO NOT TAKE MORNING OF SURGERY     Magnesium 400 MG Tabs  Take 0.5 tablets (200 mg) by mouth 2 (two) times daily slowmag  Medication Adjustments for Surgery: Hold day of surgery  Notes to patient: DO NOT TAKE MORNING OF SURGERY     metFORMIN 500 MG 24 hr tablet  Take 1 tablet (500 mg) by mouth nightly  Commonly known as: GLUCOPHAGE-XR  Medication Adjustments for Surgery: Take as prescribed  Notes to patient: NIGHT     metoprolol tartrate 25 MG tablet  Take 1 tablet (25 mg) by mouth daily  Commonly known as: LOPRESSOR  Medication Adjustments for Surgery: Take as prescribed  Notes to patient: NIGHT     pantoprazole 40 MG tablet  Take 1 tablet (40 mg) by mouth every evening  Commonly known as: PROTONIX  Medication Adjustments for Surgery: Take as prescribed  Notes to patient: NIGHT     PROBIOTIC-10 PO  Take by mouth daily  Medication Adjustments for Surgery: Stop 7 days before surgery  Notes to patient: LAST DAY: 03/22/22, RESUME AFTER SURGERY     vitamin D 25 MCG (1000 UT) tablet  Take 1 tablet (1,000 Units total) by mouth daily  Commonly known as: CHOLECALCIFEROL  Medication Adjustments for Surgery: Hold day of surgery  Notes to patient: DO NOT TAKE MORNING OF SURGERY            Allergies   Allergen Reactions    Ciprofloxacin Other (See Comments)     Severe Connective Tissue (joint) pain and stiffness    Nsaids Other (See Comments)  Pt states this causes her bleeding d/t PAN     Social History     Occupational History    Not on file   Tobacco Use    Smoking status: Never     Smokeless tobacco: Never   Vaping Use    Vaping status: Never Used   Substance and Sexual Activity    Alcohol use: Yes     Alcohol/week: 2.0 - 3.0 standard drinks of alcohol     Types: 2 - 3 Glasses of wine per week    Drug use: No    Sexual activity: Not Currently     Partners: Male     Birth control/protection: Post-menopausal       Menstrual History:   LMP / Status  Postmenopausal     No LMP recorded. Patient is postmenopausal.    Tubal Ligation?  No valid surgical or medical questions entered.             Exam Scores:   SDB score  Risk Category: OSA Diagnosed With PAP    PONV score  Nausea Risk: MODERATE RISK    MST score  MST Score: 0    Allergy score  Risk Category: Low Risk    Frailty score  CFS Score: 2    MICA       RCRI score  RCRI Score: 0    DASI  DASI Score: 29.45  METs Level: 6.36       Visit Vitals  BP 128/73   Pulse 69   Temp 97.9 F (36.6 C)   Resp 17   Ht 1.626 m (5\' 4" )   Wt 66.2 kg (146 lb)   SpO2 97%   BMI 25.06 kg/m       Review of Systems   Constitutional: Negative.  Diaphoresis: psoriasis at hairline.   HENT:  Positive for congestion and sore throat.    Eyes: Negative.    Respiratory: Negative.     Cardiovascular: Negative.    Gastrointestinal:  Positive for constipation and diarrhea.        IBS   Genitourinary:  Positive for urgency.   Musculoskeletal:  Positive for back pain and neck pain. Negative for falls.   Skin:         Psoriasis at hair line   Neurological: Negative.    Endo/Heme/Allergies:  Positive for environmental allergies.   Psychiatric/Behavioral:  The patient is nervous/anxious.        Physical Exam:  Mallampati: II  TM distance: 3 FB (4-6 cm)  Mouth opening: 3 FB (4-6 cm)  Neck extension: full  (-) enlarged neck circumference    Normal dentition    Normal neurological exam  Mental status: alert and oriented x3  Speech: normal    Normal cardiovascular exam  Heart rhythm: regular  no murmur:    (-) a murmur and peripheral edema    Normal pulmonary exam  Breath sounds clear to  auscultation bilaterally        Normal abdominal exam.  Abdomen: soft      Recent Labs   CBC (last 180 days) 12/13/21  1002 03/14/22  1608   WBC 3.93 4.72   RBC 4.43 4.26   Hgb 12.5 12.0   Hematocrit 39.4 37.8   MCV 88.9 88.7   MCH 28.2 28.2   MCHC 31.7 31.7   RDW 14 14   Platelets 141* 137*   MPV 12.0 12.2   Nucleated RBC 0.0 0.0   Absolute NRBC 0.00 0.00  Recent Labs   BMP (last 180 days) 12/13/21  1002 03/14/22  1608   Glucose 166* 228*   BUN 24.0* 28.0*   Creatinine 1.2* 1.2*   Sodium 139 137   Potassium 4.1 5.4*   Chloride 106 107   CO2 28 19   Calcium 9.6 8.8   Anion Gap 5.0 11.0   EGFR 44.3  --    eGFR  --  48.0*         Recent Labs   Other (last 180 days) 12/13/21  0938 12/13/21  1002 03/14/22  1608   ALT  --  9 11   AST (SGOT)  --  16 16   Protein, Total  --  6.3 6.4   Hemoglobin A1C 7.2*  --  7.1*             Anesthesia Plan:  ASA 3   Anesthetic Options Discussed: General  Potential anesthesia/Perioperative problems: Coagulopathy    .  Discussed DNR status with: Patient      A discussion with regarding next steps to prepare for the procedure and the planned anesthesia care took place during today's visit.  I explained that the patient will meet with their anesthesiology providers on the DOS.  Discussed with Patient

## 2022-03-14 NOTE — PSS Phone Screening (Signed)
Subjective A 71 y/o female scheduled for EUS with Dr. Linde Gillis on 6/28 @ [facility].   Background Patient presented to [facility] for pre-operative evaluation.    Assessment EKG done 6/12 NSR, no changes from 03/2019 (Epic EKG).   Labs done 6/12 pending.   Recommendation Per [facility] NP,

## 2022-03-14 NOTE — Progress Notes (Signed)
Preoperative Incentive Spirometer Evaluation at [facility]:     Education: Patient was provided a copy of the Incentive Spirometer instructions as well as watched an educational video for usage. Patient verbalized understanding of reasons for use and importance of technique.     Administration: Patient was sitting upright on side of bed/chair to complete. [facility] Tech ensured proper technique was followed.     Baseline Spirometry Level (mL): 1750 mL  March 14, 2022     Patient took copy of instructions and Incentive Spirometer with them upon exiting the clinic.

## 2022-03-15 DIAGNOSIS — Z6825 Body mass index (BMI) 25.0-25.9, adult: Secondary | ICD-10-CM | POA: Insufficient documentation

## 2022-03-15 DIAGNOSIS — I071 Rheumatic tricuspid insufficiency: Secondary | ICD-10-CM | POA: Insufficient documentation

## 2022-03-15 DIAGNOSIS — R8290 Unspecified abnormal findings in urine: Secondary | ICD-10-CM | POA: Insufficient documentation

## 2022-03-15 DIAGNOSIS — I517 Cardiomegaly: Secondary | ICD-10-CM | POA: Insufficient documentation

## 2022-03-15 DIAGNOSIS — Z8719 Personal history of other diseases of the digestive system: Secondary | ICD-10-CM | POA: Insufficient documentation

## 2022-03-15 DIAGNOSIS — Z923 Personal history of irradiation: Secondary | ICD-10-CM | POA: Insufficient documentation

## 2022-03-15 DIAGNOSIS — G4733 Obstructive sleep apnea (adult) (pediatric): Secondary | ICD-10-CM | POA: Insufficient documentation

## 2022-03-15 DIAGNOSIS — N390 Urinary tract infection, site not specified: Secondary | ICD-10-CM | POA: Insufficient documentation

## 2022-03-15 DIAGNOSIS — Z9289 Personal history of other medical treatment: Secondary | ICD-10-CM | POA: Insufficient documentation

## 2022-03-15 DIAGNOSIS — R6889 Other general symptoms and signs: Secondary | ICD-10-CM | POA: Insufficient documentation

## 2022-03-15 LAB — ECG 12-LEAD
IHS MUSE NARRATIVE AND IMPRESSION: NORMAL
P Axis: 59 degrees
R Axis: 86 degrees
Ventricular Rate: 66 {beats}/min

## 2022-03-16 MED ORDER — CEPHALEXIN 500 MG PO CAPS
500.0000 mg | ORAL_CAPSULE | Freq: Two times a day (BID) | ORAL | 0 refills | Status: AC
Start: 2022-03-16 — End: 2022-03-21

## 2022-03-16 NOTE — Addendum Note (Signed)
Addended by: Leta Speller on: 03/16/2022 04:27 PM     Modules accepted: Orders

## 2022-03-17 ENCOUNTER — Ambulatory Visit (INDEPENDENT_AMBULATORY_CARE_PROVIDER_SITE_OTHER): Payer: Medicare Other | Admitting: Endocrinology, Diabetes and Metabolism

## 2022-03-17 ENCOUNTER — Encounter (INDEPENDENT_AMBULATORY_CARE_PROVIDER_SITE_OTHER): Payer: Self-pay | Admitting: Endocrinology, Diabetes and Metabolism

## 2022-03-17 VITALS — BP 139/88 | HR 69 | Resp 16 | Ht 64.0 in | Wt 145.8 lb

## 2022-03-17 DIAGNOSIS — E1122 Type 2 diabetes mellitus with diabetic chronic kidney disease: Secondary | ICD-10-CM | POA: Insufficient documentation

## 2022-03-17 DIAGNOSIS — E119 Type 2 diabetes mellitus without complications: Secondary | ICD-10-CM

## 2022-03-17 NOTE — Patient Instructions (Addendum)
Please note that in the future if you need prescription refills, plan ahead and call our clinic during normal clinic hours only to ensure these get done in a timely fashion.  If you are overdue for your follow up appointment, we may ask that you come in for a visit before refilling the medications.  Thanks for your understanding.    Be sure you arrive to your appointments on time in the future.  If you are late by 10 minutes or more, we may ask you reschedule because it is discourteous to patients on our schedule who do come in on time to ask them to wait because you were late.    Recommended follow-up from today's visit:  pending the results of your upcoming tests.       Be sure to bring your glucose meter or blood sugar log next visit for review of sugars.    Diabetes General Instructions:  Take A+ care of yourself!  Failure to "get the results" can lead to the devastating complications of diabetes.  You can help to avoid this misery by following our advice, and by controlling all aspects of your disease to the best of your ability.  I look forward to helping you improve your control.  1.  Meal Plan:   a.  Control your calorie intake to avoid gaining weight.   b.  Please eat 3 regular meals, don't skip. Eat very healthy foods, including vegetables, fruits, whole grains, nuts (in limitation), and fish.  Use olive or canola oil as your oil source.  No fruit juices or regular sodas.   c.  Sodium restriction <1500 mg daily.  2.  Exercise:   Activity:  Consider an activity you can be comfortable with such as walking, jogging, running, swimming, or weights.  Try to walk at least 5 days a week for 30 minutes each day.  Start slowly and gradually build up your stamina.  You can do this.  Find activities that you enjoy!  Be persistent and enthusiastic. Don't injure yourself and do not exercise to the point of exhaustion.  Be very careful.  Consult your MD.  3.  Daily careful shoe and foot exam.  If you cannot see your feet  clearly and entirely please either use a mirror and/or ask a relative to do the exam for you (see below)  4.  Blood Sugar Monitoring:       a. Please check your sugars 1 times each day.  Record your glucose values in a log.  Please bring your glucose readings or meter to each and every visit with each of your diabetes care-providers.  If you are checking your sugars twice daily you can vary the times: before breakfast and dinner one day, and before lunch and 2 hours after dinner the next day.  It is important to check your sugars 2 hours after meals to see how high the glucose readings are at this time.  If you are on short acting and long acting insulins, you need to check sugars before each meal and bedtime at the very least.       b.  Ideal blood sugar levels 90 to 140 before and 90 to 180 2hr after meals.  5.  Yearly ophthalmology exam- be sure to have your ophthalmologist forward [place] a copy of his exam report.  See your dentist every 6 months.  Get flu shot yearly.  6.  Labs: typically get comprehensive metabolic panel and fasting lipid panel once  per year (if controlled); A1c tests are done 2-4 times per year (depending on control)  7.  Continued follow-up: every 3 months (or every 6 months if excellent control).  8.  Insulin can be injected anywhere where you can pinch up fat (typically belly, inner thighs or outside of arms).  9.  Most diabetes medications (other than metformin and Actos) can cause low blood sugars.  If you experience symptoms such as shakiness, weakness, confusion, lightheadedness, or sweats, this may indicate your blood sugar is low.  In this situation, please check your blood sugar.  If it is low (<70), have a small snack such as 4 oz of orange juice, 1 cup of milk, 4-5 pieces of hard candy or 3-4 glucose tablets.  Recheck sugar in 15 minutes.  If not improved, can repeat as needed.  10.  Blood pressure surveillance and cholesterol surveillance are also important parts of minimizing the  risk for heart disease in the future.  Typically the goal blood pressure is < 130/80 and goal LDL (bad cholesterol) is <100 (for patient already with heart disease the LDL should be <70).  11.  Regarding medication for your diabetes: continue Janumet 50-500 mg twice daily with meals; metformin 500 mg with dinner.  Consider trying a bedtime snack that is protein/fiber based (i.e. Nuts, cheese, apple, peanut butter) to see if AM fasting sugars improve    12.  Diabetes foot care DO's and DON'TS:  DO's:  Wash your feet daily with warm water & soap.  Never soak your feet without first checking the temperature of the water by hand  Dry your feet well, especially between the toes  If the skin on your feet is dry, apply lotion to them everyday after bathing.  If your feet sweat a large amount, apply powder to them everyday  Check your feet daily for blisters, cuts, sores, redness or swelling.  Check carefully between your toes.  Use a mirror to check the bottom of your feet.  Notify your doctor right away if you find something wrong  Use an emery board gently to shape toenails even with the ends of the toes  File calluses or rough skin with an emery board to remove dead skin.  This should be done after washing the feet to help soften the skin.  Cut your toenails straight across (to prevent ingrown toenails)  Wear socks or stockings without seams or bumps that are clean and soft and well fitting  Keep your feet warm and dry  Wear shoes that fit well and do not rub toes or heels  Examine your shoes daily for cracks, pebbles, nails or anything that could hurt your feet  See your podiatrist (foot doctor) if you have a wound that is not healing    DON'Ts:  Never walk barefoot indoors or outdoors  Never use a hot water bottle or heating pad on your feet  Don't put lotion between your toes  Never use a knife or razor blade to cut your toenails or feet  Don't use chemicals or corn and callous removers yourself  Never rip off a  hangnail  Never wear garters, tight socks or other clothing which cuts off circulation to your feet  Don't sit with your legs crossed at the knees as this slows blood flow to your feet  Don't smoke!

## 2022-03-17 NOTE — Progress Notes (Signed)
Pt did not bring glucose log.  Pt stated only checking once daily.

## 2022-03-17 NOTE — Progress Notes (Signed)
Pt is concerned about her elevated A1C about a yr.

## 2022-03-22 ENCOUNTER — Telehealth (INDEPENDENT_AMBULATORY_CARE_PROVIDER_SITE_OTHER): Payer: Self-pay | Admitting: Surgery

## 2022-03-22 NOTE — Telephone Encounter (Signed)
Procedure on June 28,2023 at [facility] at 315 pm with Dr. Linde Gillis. Please arrive 90 minutes prior to procedure time. Call [phone] or send a message on mychart if you have any questions. Thank you!

## 2022-03-24 ENCOUNTER — Other Ambulatory Visit (FREE_STANDING_LABORATORY_FACILITY): Payer: Medicare Other

## 2022-03-24 DIAGNOSIS — E119 Type 2 diabetes mellitus without complications: Secondary | ICD-10-CM

## 2022-03-24 LAB — GLUCOSE, FASTING: Glucose Fasting: 151 mg/dL — ABNORMAL HIGH (ref 70–100)

## 2022-03-25 ENCOUNTER — Encounter (INDEPENDENT_AMBULATORY_CARE_PROVIDER_SITE_OTHER): Payer: Self-pay

## 2022-03-25 ENCOUNTER — Telehealth (INDEPENDENT_AMBULATORY_CARE_PROVIDER_SITE_OTHER): Payer: Self-pay | Admitting: Surgery

## 2022-03-25 NOTE — Telephone Encounter (Signed)
Spoke to patient about change in procedure to [place].

## 2022-03-26 LAB — C-PEPTIDE: C-Peptide: 2.03 ng/mL (ref 0.80–3.85)

## 2022-03-30 ENCOUNTER — Ambulatory Visit: Payer: Medicare Other | Admitting: Certified Registered Nurse Anesthetist

## 2022-03-30 ENCOUNTER — Encounter: Payer: Self-pay | Admitting: Surgery

## 2022-03-30 ENCOUNTER — Ambulatory Visit
Admission: RE | Admit: 2022-03-30 | Discharge: 2022-03-30 | Disposition: A | Discharge status: Home / Self Care | Payer: Medicare Other | Source: Ambulatory Visit | Attending: Surgery | Admitting: Surgery

## 2022-03-30 ENCOUNTER — Encounter
Admission: RE | Disposition: A | Discharge status: Home / Self Care | Payer: Self-pay | Source: Ambulatory Visit | Attending: Surgery

## 2022-03-30 DIAGNOSIS — K8681 Exocrine pancreatic insufficiency: Secondary | ICD-10-CM | POA: Insufficient documentation

## 2022-03-30 DIAGNOSIS — Z79899 Other long term (current) drug therapy: Secondary | ICD-10-CM | POA: Insufficient documentation

## 2022-03-30 DIAGNOSIS — Z7984 Long term (current) use of oral hypoglycemic drugs: Secondary | ICD-10-CM | POA: Insufficient documentation

## 2022-03-30 DIAGNOSIS — K862 Cyst of pancreas: Secondary | ICD-10-CM | POA: Insufficient documentation

## 2022-03-30 HISTORY — PX: EGD, EUS (ESOPHAGEAL ULTRASOUND): SHX3809

## 2022-03-30 SURGERY — ESOPHAGOGASTRODUODENOSCOPY (EGD), ESOPHAGEAL ULTRASOUND (EUS) LIMITED TO THE ESOPHAGUS, STOMACH OR DUODENUM
Anesthesia: Anesthesia General

## 2022-03-30 MED ORDER — GLYCOPYRROLATE 1 MG/5ML IJ SOLN
INTRAMUSCULAR | Status: AC
Start: 2022-03-30 — End: ?
  Filled 2022-03-30: qty 5

## 2022-03-30 MED ORDER — PROPOFOL INFUSION 10 MG/ML
INTRAVENOUS | Status: DC | PRN
Start: 2022-03-30 — End: 2022-03-30
  Administered 2022-03-30: 150 ug/kg/min via INTRAVENOUS

## 2022-03-30 MED ORDER — LACTATED RINGERS IV SOLN
INTRAVENOUS | Status: DC
Start: 2022-03-30 — End: 2022-03-30

## 2022-03-30 MED ORDER — LIDOCAINE HCL 2 % IJ SOLN
INTRAMUSCULAR | Status: DC | PRN
Start: 2022-03-30 — End: 2022-03-30
  Administered 2022-03-30: 50 mg via INTRAVENOUS

## 2022-03-30 MED ORDER — PROPOFOL 10 MG/ML IV EMUL (WRAP)
INTRAVENOUS | Status: DC | PRN
Start: 2022-03-30 — End: 2022-03-30
  Administered 2022-03-30: 60 mg via INTRAVENOUS
  Administered 2022-03-30 (×2): 20 mg via INTRAVENOUS

## 2022-03-30 MED ORDER — LIDOCAINE HCL 2 % IJ SOLN
INTRAMUSCULAR | Status: AC
Start: 2022-03-30 — End: ?
  Filled 2022-03-30: qty 20

## 2022-03-30 MED ORDER — GLYCOPYRROLATE 0.2 MG/ML IJ SOLN (WRAP)
INTRAMUSCULAR | Status: DC | PRN
Start: 2022-03-30 — End: 2022-03-30
  Administered 2022-03-30: .2 mg via INTRAVENOUS

## 2022-03-30 MED ORDER — PROPOFOL 10 MG/ML IV EMUL (WRAP)
INTRAVENOUS | Status: AC
Start: 2022-03-30 — End: ?
  Filled 2022-03-30: qty 40

## 2022-03-30 SURGICAL SUPPLY — 24 items
BLOCK BITE MAXI 60FR LF STRD STRAP SDPRT (Procedure Accessories) ×1
BLOCK BITE OD60 FR STURDY STRAP SIDEPORT (Procedure Accessories) ×1
BLOCK BITE OD60 FR STURDY STRAP SIDEPORT DENTAL RETENTION RIM MAXI (Procedure Accessories) ×1 IMPLANT
FORCEPS BIOPSY L240 CM LARGE CAPACITY (Procedure Accessories) ×1
FORCEPS BIOPSY L240 CM LARGE CAPACITY MICROMESH TEETH STREAMLINE (Procedure Accessories) ×1 IMPLANT
FORCEPS BX SS LG CPC RJ 4 2.4MM 240CM (Procedure Accessories) ×1
GOWN SRG LG SMARTGOWN LF STRL LVL 4 (Gown) ×4
GOWN SRGCL LG LEVEL 4 BRTHBL STRL LF DSPSBL SMARTGOWN (Gown) ×2 IMPLANT
JELLY LUB EZ LF STRL H2O SOL NGRS TRNLU (Irrigation Solutions) ×2 IMPLANT
KIT COMPLIANCE ENDOKIT OP4 CA 1.1+ 6FT (Procedure Accessories) ×1
KIT ENDOSCOPIC L6 FT CLEAN ADAPTER (Procedure Accessories) ×1
KIT ENDOSCOPIC L6 FT CLEAN ADAPTER COMPLIANCE ENDOKIT ORCAPOD 4 1.1 OZ (Procedure Accessories) ×1 IMPLANT
MASK FACE FM FLDSHLD LF LVL 3 TIE EYSHLD (Personal Protection) ×4 IMPLANT
SNARE ESCP MIC CPTVTR 13MM 240IN STRL (GE Lab Supplies)
SNARE SMALL HEXAGON CAPTIVATOR STIFF ENDOSCOPIC POLYPECTOMY (GE Lab Supplies) IMPLANT
SPONGE GAUZE L4 IN X W4 IN 16 PLY (Dressing) ×1
SPONGE GAUZE L4 IN X W4 IN 16 PLY MAXIMUM ABSORBENT USP TYPE VII (Dressing) ×1 IMPLANT
SPONGE GZE CTTN CRTY 4X4IN LF NS 16 PLY (Dressing) ×1
SYRINGE 50 ML GRADUATE NONPYROGENIC DEHP (Syringes, Needles) ×1
SYRINGE 50 ML GRADUATE NONPYROGENIC DEHP FREE PVC FREE BD MEDICAL (Syringes, Needles) ×1 IMPLANT
SYRINGE MED 50ML LF STRL GRAD N-PYRG (Syringes, Needles) ×1
WATER STERILE PLASTIC POUR BOTTLE 1000 (Irrigation Solutions) ×1
WATER STERILE PLASTIC POUR BOTTLE 1000 ML (Irrigation Solutions) ×1 IMPLANT
WATER STRL 1000ML LF PLS PR BTL (Irrigation Solutions) ×1

## 2022-03-30 NOTE — Anesthesia Postprocedure Evaluation (Addendum)
Anesthesia Post Evaluation    Patient: Katelyn Caldwell    Procedure(s):  EGD, EUS (ESOPHAGEAL ULTRASOUND)    Anesthesia type: general    Last Vitals:   Vitals Value Taken Time   BP 149/80 03/30/22 1550   Temp 36.2 C (97.2 F) 03/30/22 1520   Pulse 68 03/30/22 1550   Resp 18 03/30/22 1530   SpO2 97 % 03/30/22 1550                 Anesthesia Post Evaluation:     Patient Evaluated: PACU  Patient Participation: complete - patient participated  Level of Consciousness: awake  Pain Score: 0  Pain Management: adequate    Airway Patency: patent    Anesthetic complications: No      PONV Status: none    Cardiovascular status: stable  Respiratory status: room air  Hydration status: stable        Signed by: Juanell Fairly, MD, 03/30/2022 4:01 PM

## 2022-03-30 NOTE — H&P (Signed)
GI PRE PROCEDURE NOTE      Proceduralist Comments:   Review of Systems and Past Medical / Surgical History performed: Yes     Indications: ?STEATORRHEA    Outpatient Medications Marked as Taking for the 03/30/22 encounter [facility] Encounter)   Medication Sig Dispense Refill    atorvastatin (LIPITOR) 40 MG tablet Take 2 tablets (80 mg) by mouth nightly      Creon 36000-114000 units Cap DR Particles Take 1 capsule by mouth 4 (four) times daily 120 capsule 12    cyanocobalamin ([employer] VITAMIN B12) 1000 MCG tablet Take 0.5 tablets (500 mcg total) by mouth daily 45 tablet 3    fluticasone (FLONASE) 50 MCG/ACT nasal spray 2 sprays by Nasal route daily (Patient taking differently: 2 sprays by Nasal route nightly) 16 g 3    lidocaine (Lidoderm) 5 % Place 1 patch onto affected skin every twelve hours as needed for discomfort.  Remove each patch after 12 hours of use 30 patch 3    losartan (COZAAR) 25 MG tablet Take 1 tablet (25 mg total) by mouth daily (Patient taking differently: Take 1 tablet (25 mg) by mouth every morning) 90 tablet 3    Magnesium 400 MG Tab Take 0.5 tablets (200 mg) by mouth 2 (two) times daily slowmag      metFORMIN (GLUCOPHAGE-XR) 500 MG 24 hr tablet Take 1 tablet (500 mg) by mouth nightly 90 tablet 3    metoprolol tartrate (LOPRESSOR) 25 MG tablet Take 1 tablet (25 mg) by mouth daily (Patient taking differently: Take 1 tablet (25 mg) by mouth nightly) 90 tablet 3    pantoprazole (PROTONIX) 40 MG tablet Take 1 tablet (40 mg) by mouth every evening 90 tablet 3    Probiotic Product (PROBIOTIC-10 PO) Take by mouth daily      SITagliptin-MetFORMIN HCl (Janumet XR) 50-500 MG Tablet SR 24 hr Take 1 tablet by mouth 2 (two) times daily with meals 180 tablet 3    vitamin D (CHOLECALCIFEROL) 25 MCG (1000 UT) tablet Take 1 tablet (1,000 Units total) by mouth daily (Patient taking differently: Take 0.5 tablets (500 Units) by mouth daily)         Allergies   Allergen Reactions    Ciprofloxacin Other (See Comments)      Severe Connective Tissue (joint) pain and stiffness    Nsaids Other (See Comments)     Pt states this causes her bleeding d/t PAN       Social History - non-contributory  Family History - non-contributory    Review of Systems - General ROS: negative for - chills, fever or malaise  Respiratory ROS: no cough, shortness of breath, or wheezing  Cardiovascular ROS: no chest pain or dyspnea on exertion  Gastrointestinal ROS: no abdominal pain, change in bowel habits, or black or bloody stools      Physical Exam / Laboratory Data (If applicable)       General: Alert and cooperative  Lungs: Lungs clear to auscultation  Cardiac: RRR, normal S1S2.    Abdomen: Soft, non tender. 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 Risks, benefits and alternatives to the planned procedure and sedation  have been explained to the patient or guardian:  yes        Signed by: Arlice Colt, MD

## 2022-03-30 NOTE — Transfer of Care (Signed)
Anesthesia Transfer of Care Note    Patient: Katelyn Caldwell    Procedures performed: Procedure(s):  EGD, EUS (ESOPHAGEAL ULTRASOUND)    Anesthesia type: General TIVA    Patient location:Phase II PACU    Last vitals:   Vitals:    03/30/22 1340   BP: 165/90   Pulse: 65   Resp: 16   Temp: 36.3 C (97.3 F)   SpO2: 98%       Post pain: Patient not complaining of pain, continue current therapy      Mental Status:awake    Respiratory Function: tolerating face mask    Cardiovascular: stable    Nausea/Vomiting: patient not complaining of nausea or vomiting    Hydration Status: adequate    Post assessment: no apparent anesthetic complications, no reportable events and no evidence of recall    Signed by: Fuller Mandril, CRNA  03/30/22 3:19 PM

## 2022-03-30 NOTE — Anesthesia Preprocedure Evaluation (Addendum)
Anesthesia Evaluation    AIRWAY    Mallampati: II    TM distance: >3 FB  Neck ROM: full  Mouth Opening:full  Planned to use difficult airway equipment: No CARDIOVASCULAR    cardiovascular exam normal       DENTAL    no notable dental hx               PULMONARY    pulmonary exam normal     OTHER FINDINGS                                      Relevant Problems   ANESTHESIA   (+) OSA on CPAP      PULMONARY   (+) OSA on CPAP      CARDIO   (+) Essential hypertension   (+) Hemorrhoids   (+) PAN (polyarteritis nodosa)      GI   (+) Gastroesophageal reflux disease without esophagitis      GU/RENAL   (+) Chronic kidney disease, stage III (moderate)      ENDO   (+) Type 2 diabetes mellitus with diabetic chronic kidney disease   (+) Type 2 diabetes mellitus without complication, without long-term current use of insulin      OTHER   (+) Pes anserinus bursitis       PSS Anesthesia Comments: OSA, HTN, . Hx of breast cancer . Stage 3 kidney failure, . DM, Anxiety, . PVC's,         Anesthesia Plan    ASA 3     general                     intravenous induction   Detailed anesthesia plan: general IV        Post op pain management: per surgeon    informed consent obtained    Plan discussed with CRNA.                   Signed by: Lucile Shutters, MD 03/30/22 1:58 PM

## 2022-03-30 NOTE — Discharge Instr - AVS First Page (Addendum)
EGD Discharge Instructions  General Instructions:  1. Following sedation, your judgement, perception, and coordination are considered impaired. Even though you may feel awake and alert, you are considered legally intoxicated. Therefore, until the next morning;  Do not drive  Do not operate appliances or equipment that requires reaction time (e.g. Stove, electrical tools, machinery)  Do not sign legal documents or be involved in important decisions.  Do not smoke if alone  Do not drink alcoholic beverages  Go directly home and rest for several hours before resuming your routine activities.  It is highly recommended to have a responsible adult stay with you for the next 24 hours    2. Tenderness, swelling or pain may occur at the IV site where you received sedation. If you experience this, apply warm soaks to the area. Notify your physician if this persists.      Instructions Specific To Procedures - Report To Physician Any Of The Following:    Upper Endoscopy, ERCP, Dilations, EUS   1. Pain in chest   2. Nausea/vomitting   3. Fevers/Chills within 24 hours after procedure. Temp>101deg F   4. Severe and persistent abdominal pain and bloating     In Addition:   Mild throat soreness may follow this procedure. Warm salt water gargling or    lozenges of your choice will most likely relieve your discomfort or cold drinks and   popsicles.        Additional Discharge Instructions  Your diet after the procedure: Nothing red to eat or drink for 24 hours.       If you have questions or problems contact your MD immediately. If you need immediate attention, call your MD, 911 and/or go to nearest emergency room.

## 2022-03-31 ENCOUNTER — Ambulatory Visit (INDEPENDENT_AMBULATORY_CARE_PROVIDER_SITE_OTHER): Payer: Medicare Other | Admitting: Family Medicine

## 2022-03-31 ENCOUNTER — Encounter: Payer: Self-pay | Admitting: Surgery

## 2022-03-31 VITALS — BP 126/70 | HR 76 | Temp 96.5°F | Ht 63.5 in | Wt 146.4 lb

## 2022-03-31 DIAGNOSIS — K219 Gastro-esophageal reflux disease without esophagitis: Secondary | ICD-10-CM

## 2022-03-31 DIAGNOSIS — J0100 Acute maxillary sinusitis, unspecified: Secondary | ICD-10-CM

## 2022-03-31 DIAGNOSIS — D0512 Intraductal carcinoma in situ of left breast: Secondary | ICD-10-CM

## 2022-03-31 DIAGNOSIS — J301 Allergic rhinitis due to pollen: Secondary | ICD-10-CM

## 2022-03-31 DIAGNOSIS — E1122 Type 2 diabetes mellitus with diabetic chronic kidney disease: Secondary | ICD-10-CM

## 2022-03-31 DIAGNOSIS — Z8719 Personal history of other diseases of the digestive system: Secondary | ICD-10-CM

## 2022-03-31 DIAGNOSIS — M300 Polyarteritis nodosa: Secondary | ICD-10-CM

## 2022-03-31 DIAGNOSIS — I10 Essential (primary) hypertension: Secondary | ICD-10-CM

## 2022-03-31 DIAGNOSIS — D696 Thrombocytopenia, unspecified: Secondary | ICD-10-CM

## 2022-03-31 DIAGNOSIS — N1831 Chronic kidney disease, stage 3a: Secondary | ICD-10-CM

## 2022-03-31 DIAGNOSIS — K862 Cyst of pancreas: Secondary | ICD-10-CM | POA: Insufficient documentation

## 2022-03-31 DIAGNOSIS — Z0000 Encounter for general adult medical examination without abnormal findings: Secondary | ICD-10-CM

## 2022-03-31 MED ORDER — AZITHROMYCIN 250 MG PO TABS
ORAL_TABLET | ORAL | 0 refills | Status: AC
Start: 2022-03-31 — End: 2022-04-06
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
## 2022-03-31 NOTE — Progress Notes (Signed)
Have you seen any specialists/other providers since your last visit with [place]?    No      The patient was informed that the following HM items are still outstanding:   Health Maintenance Due   Topic Date Due    Advance Directive on File  Never done    URINE MICROALBUMIN  01/22/2022

## 2022-04-04 ENCOUNTER — Other Ambulatory Visit: Payer: Self-pay | Admitting: Surgery

## 2022-04-04 DIAGNOSIS — K909 Intestinal malabsorption, unspecified: Secondary | ICD-10-CM

## 2022-04-07 ENCOUNTER — Telehealth (INDEPENDENT_AMBULATORY_CARE_PROVIDER_SITE_OTHER): Payer: Self-pay

## 2022-04-07 NOTE — Telephone Encounter (Signed)
Patient verbally acknowledged understanding.       Please call patient tomorrow to schedule appt with Dr. Kingsley Plan

## 2022-04-07 NOTE — Telephone Encounter (Signed)
-----   Message from Arlice Colt, MD sent at 04/04/2022  2:57 PM EDT -----  Regarding: Follow-up appointment  Please make sure you give the patient an appointment with Dr. Wilfrid Lund [facility] in August (after she returns from her travel).  She has a pancreatic cystic lesion and I already ordered an MRI.

## 2022-04-11 ENCOUNTER — Encounter: Payer: Self-pay | Admitting: Surgery

## 2022-04-11 DIAGNOSIS — K909 Intestinal malabsorption, unspecified: Secondary | ICD-10-CM
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
## 2022-04-26 NOTE — Telephone Encounter (Signed)
Patient called to book an appointment with Dr. Toy Care per Dr. Linde Gillis. Writer offered first available appointment for 06/29/22 patient declined and stated Dr. Linde Gillis wanted her to come in August. Please advice. Patient states she has been waiting for a call back and just wanted to let someone know that she wont be available 8/21.    Cb is [phone]

## 2022-04-28 ENCOUNTER — Ambulatory Visit (INDEPENDENT_AMBULATORY_CARE_PROVIDER_SITE_OTHER): Payer: Medicare Other | Admitting: Endocrinology, Diabetes and Metabolism

## 2022-05-03 ENCOUNTER — Ambulatory Visit
Admission: RE | Admit: 2022-05-03 | Discharge: 2022-05-03 | Disposition: A | Discharge status: Home / Self Care | Payer: Medicare Other | Source: Ambulatory Visit | Attending: Surgery | Admitting: Surgery

## 2022-05-03 ENCOUNTER — Other Ambulatory Visit: Payer: Self-pay | Admitting: Surgery

## 2022-05-03 DIAGNOSIS — K909 Intestinal malabsorption, unspecified: Secondary | ICD-10-CM

## 2022-05-04 ENCOUNTER — Other Ambulatory Visit (INDEPENDENT_AMBULATORY_CARE_PROVIDER_SITE_OTHER): Payer: Self-pay | Admitting: Family Medicine

## 2022-05-04 DIAGNOSIS — I10 Essential (primary) hypertension: Secondary | ICD-10-CM

## 2022-05-04 MED ORDER — METFORMIN HCL ER 500 MG PO TB24
500.0000 mg | ORAL_TABLET | Freq: Every evening | ORAL | 3 refills | Status: DC
Start: 2022-05-04 — End: 2023-05-24

## 2022-05-04 MED ORDER — LOSARTAN POTASSIUM 25 MG PO TABS
25.0000 mg | ORAL_TABLET | Freq: Every day | ORAL | 3 refills | Status: DC
Start: 2022-05-04 — End: 2023-05-24

## 2022-06-10 ENCOUNTER — Encounter (INDEPENDENT_AMBULATORY_CARE_PROVIDER_SITE_OTHER): Payer: Self-pay

## 2022-06-15 ENCOUNTER — Encounter (INDEPENDENT_AMBULATORY_CARE_PROVIDER_SITE_OTHER): Payer: Self-pay | Admitting: Gastroenterology

## 2022-06-15 ENCOUNTER — Ambulatory Visit (INDEPENDENT_AMBULATORY_CARE_PROVIDER_SITE_OTHER): Payer: Medicare Other | Admitting: Gastroenterology

## 2022-06-15 VITALS — BP 134/79 | Resp 18 | Ht 63.62 in | Wt 143.8 lb

## 2022-06-15 DIAGNOSIS — K862 Cyst of pancreas: Secondary | ICD-10-CM

## 2022-06-15 DIAGNOSIS — R160 Hepatomegaly, not elsewhere classified: Secondary | ICD-10-CM | POA: Insufficient documentation

## 2022-06-15 NOTE — Patient Instructions (Addendum)
Please schedule patient for an EGD with EUS with fine-needle aspiration at [facility] in October  Please see instructions below  Please schedule MRI with MRCP with IV gadolinium if approved by her nephrologist  To schedule or reschedule a procedure, please call my procedure coordinator Robyn Free at [phone].     Endoscopic Ultrasound (EUS)     1. No solid food from midnight until the exam. You may have clear liquids only (Broth, clear sodas, clear apple juice, white grape juice, white cranberry juice, water) until 4 hours prior to your procedure.        TAKE THE FOLLOWING MEDICATIONS WITH A SIP OF WATER ON THE MORNING OF THE TEST:      You may take all of your morning medicines (except for oral diabetes medicine - pills) as usual with a sip of water up to 4 hours before your procedure.  If you take oral diabetes medicine (pills) for your diabetes: do not take the medicine the morning of your procedure.         *Important Note: This examination carries with it the risk of perforation a serious complication which could require surgery.      PLEASE NOTE:  YOU MUST HAVE SOMEONE DRIVE YOU HOME FROM YOUR PROCEDURE, OTHER THAN A TAXI OR YOUR PROCEDURE WILL BE CANCELLED!        [x]  [facility]     If your procedure is to be done at [facility], please report to the Surgical Services Registation Desk on the second floor 1 HOUR prior to your scheduled procedure.     [address]  [place], [place] [address]     Get Directions Phone: [phone]    Endoscopic Ultrasound (EUS)  An endoscopic ultrasound (EUS) is a test to look at the inside of your gastrointestinal (GI) tract. It's commonly used to look for cancers or growths in the esophagus, stomach, pancreas, liver, and rectum. It can help to stage cancer (to see how advanced a cancer is). EUS may also be used to help diagnose certain diseases or to drain cysts or abscesses.     What is EUS?  EUS shows both ultrasound images and  live video of the GI tract. During the test, a flexible tube called an endoscope (scope) is used. At the end of the scope is a tiny video camera and light. The video camera sends live images to a monitor. The scope also contains a very small ultrasound device. This uses sound waves to create images and send them to a monitor.   A needle is passed through the scope. The needle can be used take a small sample of tissue for testing. This is called a biopsy. The needle can be used to take a sample of fluid. This is called fine-needle aspiration (FNA).   Risks and possible complications  Risks and possible complications include the following:   Bleeding  Infection  A hole (perforation) in the digestive tract  Risks of sedation or anesthesia  Before the test  Be prepared before the test:  Tell your healthcare provider what medicine you take. This includes vitamins, herbs, and over-the-counter medicine. It also includes all prescribed medicines, such as any blood thinners like warfarin, clopidogrel, ibuprofen, or daily aspirin. Ask your healthcare provider if you need to stop taking some or all of them before the test.  You may be prescribed antibiotics to take before or after the test. This depends on the area being studied and what  is done during the test. These medicines help prevent infection.  Carefully follow the instructions for preparing for the test to make sure results are accurate. Instructions may include:  If you're having an EUS of the upper GI tract (esophagus, stomach, duodenum, pancreas, liver):  Don't eat or drink for 6 hours before the test.  If you're having an EUS of the lower GI tract (rectum):  Before the test, do bowel prep as instructed to clean your rectum of stool. This may involve a clear liquid diet and using a laxative (liquid or pills) the night before the test. Or it may mean doing one or more enemas the morning of the test.  Don't eat or drink for 6 hours before the test.  Be sure to arrive  on time at the facility. Bring your identification and health insurance card. Leave valuables at home. If you have them, bring X-rays or other test results with you.  Tell your healthcare provider   For your safety, tell the healthcare provider if you:   Take insulin. Your dose may need to be changed on the day of your test.  Are allergic to latex.  Have any other allergies.  Are taking blood thinners.  During the test  An endoscopic ultrasound usually takes place in a hospital. The procedure itself may take 1 to 2 hours. You will likely go home soon afterward. During the test:   You lie on your left side on an exam table.  An intravenous (IV) line will be put into a vein in your arm or hand. This line supplies fluids and medicines. To keep you comfortable during the test, you will be given a sedative medicine. This medicine prevents discomfort and will make you sleepy.  If you are having an EUS of the upper GI tract, local anesthetic may be sprayed in your throat. This will help you be more comfortable as the healthcare provider inserts the scope. The healthcare provider then gently puts the flexible scope into your mouth or nose and down your throat.  If you're having an EUS of the lower GI tract, the healthcare provider gently puts the flexible scope into your anus.  During the test, the scope sends live video and ultrasound images from inside your body to nearby monitors. These are used to examine your GI tract. Specialized procedures, such as drainage, are done as needed.  The healthcare provider may discuss the results with you soon after the test. Biopsy results take  several days.  In most cases, you can go home within a few hours of the test. When you leave the facility, have an adult family member or friend drive you, even if you don't feel that sleepy.  After the test  Here is what to expect after the test:   You may feel tired from the sedative. This should wear off by the end of the day.  If you had an  upper digestive endoscopy, your throat may feel sore for a day or two. Over-the-counter sore throat lozenges and spray should help.  You can eat and drink normally as soon as the test is done.  When to call your healthcare provider  Call your healthcare provider if you notice any of the following:   Fever of 100.4 F ( 38.0 C) or higher, or as advised by your healthcare provider  Shortness of breath  Vomiting blood, blood in stool, or black stools  Coughing or hoarse voice that won't go away  Worsening abdominal  pain  [employer] last reviewed this educational content on 03/03/2018      2000-2021 [employer], [place]. All rights reserved. This information is not intended as a substitute for professional medical care. Always follow your healthcare professional's instructions.

## 2022-06-15 NOTE — Progress Notes (Signed)
Ozzie Knobel M.Markel Kurtenbach,MD,FACG  [address]  [place], [place] [address]  Appointments: Phone [phone], Fax [phone]    FOLLOW-UP VISIT    Date of Visit: 06/15/2022 10:10 AM  Patient Name: Katelyn Caldwell   Referring Physician: Raj Janus, MD      Reason for Consultation:   Pancreas cyst    History:   Katelyn Caldwell is a 71 y.o. female here for follow-up.  She is a former patient of Dr.Soweid.  He was seeing her for suspected chronic pancreatitis.  That was essentially ruled out based on a negative fecal fat and negative endoscopic ultrasound.  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  She sees Dr. Karis Juba who is her primary gastroenterologist for IBS.  She had an EUS done by Dr. Linde Gillis at which time a small cyst was found in the head of the pancreas.  An MRI with MRCP done in August 2023 had describes a large cyst in the head of the pancreas.  This study was done without contrast.  They also describe a indeterminate lesion in the left lobe of the liver not fully evaluated due to lack of contrast.      Past Medical History:     Past Medical History:   Diagnosis Date    Abnormal vision     readers    Anxiety     no meds    Arrhythmia     PVCs    Bilateral cataracts     bil eye surgery    Bursitis     Chronic kidney disease     Stage 3 CKD - d/t PAN    CKD (chronic kidney disease), stage III     Diabetes mellitus, type 2 01/22/2021    A1C = 7, FBS = 125    Encounter for blood transfusion 2003    when dx with PAN    Exocrine pancreatic insufficiency     EPI     Fracture     right big toe, right hand, left humerus fx    Gastroesophageal reflux disease     Hemorrhagic diathesis 06/2002    PAN diagnosed at this time    Hypercalcemia 05/29/2018    Noted recently with renal labs given hx of renal disease.  Stopped vitamin d and calcium supplements being taken at that time about 1 month ago.  No hx  renal stones, no psychosis, no abdominal pain.  Has occurred prior with normal ionized calcium on review.   1/23:  Last evaluation including vitamin D normal about 1 month ago.  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  Had palpitations which have reduced with initiation of toprol.  Pt notes at [facility] for chest pain and URI and underwent EKG. She  was told abnormal and that she has had inferior wall MI given Q waves in III and AVf.  She feels fine. Denies chest pain.  Sx resolved sponta    Psoriasis     Renal insufficiency     stage 3 2/2 pan    Sleep apnea     CPAP nightly.       Current Medications:     Outpatient Medications Marked as Taking for the 06/15/22 encounter (Office Visit) with Alwyn Ren, MD   Medication Sig Dispense Refill    atorvastatin (LIPITOR) 40 MG tablet Take 2 tablets (80 mg) by mouth nightly      Creon 36000-114000 units Cap DR Particles Take 1 capsule by mouth 4 (four) times daily 120 capsule 12    cyanocobalamin ([employer] VITAMIN B12) 1000 MCG tablet Take 0.5 tablets (500 mcg total) by mouth daily 45 tablet 3    fluticasone (FLONASE) 50 MCG/ACT nasal spray 2 sprays by Nasal route daily (Patient taking differently: 2 sprays by Nasal route nightly) 16 g 3    glucose blood (FREESTYLE LITE) test strip Check blood sugar once daily 100 strip 3    Lancets (freestyle) lancets Check blood sugar once daily 100 each 3    lidocaine (Lidoderm) 5 % Place 1 patch onto affected skin every  twelve hours as needed for discomfort.  Remove each patch after 12 hours of use 30 patch 3    losartan (COZAAR) 25 MG tablet Take 1 tablet (25 mg) by mouth daily 90 tablet 3    Magnesium 400 MG Tab Take 0.5 tablets (200 mg) by mouth 2 (two) times daily slowmag      metFORMIN (GLUCOPHAGE-XR) 500 MG 24 hr tablet Take 1 tablet (500 mg) by mouth nightly 90 tablet 3    metoprolol tartrate (LOPRESSOR) 25 MG tablet Take 1 tablet (25 mg) by mouth daily (Patient taking differently: Take 1 tablet (25 mg) by mouth nightly) 90 tablet 3    pantoprazole (PROTONIX) 40 MG tablet Take 1 tablet (40 mg) by mouth every evening 90 tablet 3    Probiotic Product (PROBIOTIC-10 PO) Take by mouth daily      SITagliptin-MetFORMIN HCl (Janumet XR) 50-500 MG Tablet SR 24 hr Take 1 tablet by mouth 2 (two) times daily with meals 180 tablet 3    vitamin D (CHOLECALCIFEROL) 25 MCG (1000 UT) tablet Take 1 tablet (1,000 Units total) by mouth daily (Patient taking differently: Take 0.5 tablets (500 Units) by mouth daily)         Review of Systems:   Review of Systems   Constitutional: Denies fever, chills or weight loss.  Eyes: Denies Blurred vision, eye discharge or eye pain.   Respiratory: Denies wheezing or cough.  Cardiovascular: Denies chest pain or palpitations.  Gastrointestinal: See HPI.  Neurologic: Denies slurred speech, hemiplegia or focal deficit.  Psych: Denies depression or anxiety  Musculoskeletal: Denies joint or back pain   Skin: Denies rashes  All other review of systems reviewed and negative     Vital Signs:   BP 134/79 (BP Site: Right arm, Patient Position: Sitting)   Resp 18   Ht 1.616 m (5' 3.62")   Wt 65.2 kg (143 lb 12.8 oz)   BMI 24.98 kg/m      Physical Exam:   Physical Exam   General appearance - Well developed and well nourished, no acute distress.  Eyes - Sclera anicteric, no pallor.  Gastrointestinal - Soft, non-tender, non-distended, no masses, normal bowel sounds,  no hernias    Labs Reviewed:     Recent Labs      03/14/22  1608 12/13/21  1002 01/22/21  1119   WBC 4.72 3.93 4.21   Hgb 12.0 12.5 12.2   Hematocrit 37.8 39.4 39.2   Platelets 137* 141* 139*       Recent Labs     03/26/19  1650 12/18/18  1227   PT 12.8 12.9   PT INR 1.0 1.0       Recent Labs     03/14/22  1608 12/13/21  1002 01/22/21  1119 01/10/20  0912   Sodium 137 139 137 138   Potassium 5.4* 4.1 4.4 5.2*   Chloride 107 106 105 102   CO2 19 28 24 25    BUN 28.0* 24.0* 23.0* 25.0*   Creatinine 1.2* 1.2* 1.0 1.2   Calcium 8.8 9.6 8.9 9.6   Albumin 3.8 3.9 4.0 4.2   Protein, Total 6.4 6.3 6.4 6.8   Bilirubin, Total 0.4 0.5 0.6 0.6   Alkaline Phosphatase 88 70 72 81   ALT 11 9 12 11    AST (SGOT) 16 16 16 20    Glucose 228* 166* 136* 136*   TSH, Abn Reflex to Free T4, Serum  --   --  0.83 1.68        Lab Results   Component Value Date    STLPE 163 (L) 12/13/2021        Rads:   No results found.    MRI Abdomen WO Contrast MRCP [[id number]] (Order [id number])  Status: Final result     Study Result    Narrative & Impression   MRI ABDOMEN  AND MRCP      CLINICAL STATEMENT:     COMPARISON:     TECHNIQUE: MRI of the upper region of the abdomen was performed the  sagittal, axial, and coronal planes utilizing T1, breath-hold single shot  fast spin-echo T2 weighted techniques. Diffusion-weighted sequences were  also performed with associated ADC sequence. In and out-of-phase gradient  echo images were obtained.     Contrast was not administered.     The biliary tree was studied in multiple planes with thin section,  breath-hold single shot fast spin-echo T2 weighted technique.  Thick-slab  techniques were also utilized.  The individual partitions were displayed  and also reconstructed in multiple obliquities.     FINDINGS: The study is compared without intravenous contrast therefore  evaluation of the solid parenchymal organs and vascular structures is  limited.     HEPATOBILIARY: A 3.0 x 2.5 cm circumscribed lobular elevated T2 signal  lesion is identified within hepatic segment  2 which is not adequately  evaluated on this noncontrast examination.        MRCP: There is no intrahepatic or extrahepatic biliary ductal dilatation.  No ductal mural irregularity, or areas of stricturing are identified. The  common bile duct measures 5  mm in diameter and contains no filling  defects. The distal common bile duct tapers smoothly to the level of the  ampulla. The pancreatic duct is normal in diameter.      Gallbladder: The gallbladder is unremarkable in appearance.        Pancreas: A 2.6 x 1.6 cm multiloculated cystic lesion is identified of the  head and uncinate process of the pancreas. The cystic lesion is inseparable  from the main pancreatic duct. The pancreas is otherwise unremarkable in  appearance. The pancreatic duct is nondilated. No large solid-appearing  pancreatic lesions can be identified. There are no extrahepatic  collections. No peripancreatic inflammatory changes can be identified.     Spleen: Unremarkable.      Adrenal glands: Unremarkable.      Kidneys: No significant abnormality can be visualized of the unenhanced  kidneys. No hydronephrosis. No perinephric stranding.      Lymph Nodes: No abdominal or pelvic lymphadenopathy.     Abdominal vasculature: Difficult to adequately evaluate.     Hollow viscus: The large and small bowel are normal in caliber.     Peritoneal cavity: No ascites is present.      Musculoskeletal: The osseous structures are intact.     IMPRESSION:      1. Multiloculated cystic lesion of the pancreatic head which may represent  a side branch IPMN and or serous cystic neoplasm. Clinical and biochemical  assessment is recommended as well as 6-12 month follow-up or endoscopic  sonogram.  2. Indeterminate elevated T2 signal lesion of the left hepatic lobe.  Further evaluation is recommended with a follow-up MRI with contrast or CT  liver protocol.        Fonnie Mu, DO  05/08/2022 11:02 AM       GI Procedures:     EGD and colonoscopy done in July 2022 by Dr.  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 Karis Juba who is her primary gastroenterologist for her chronic IBS symptoms  Disposition with regards to management will depend on the results of the above studies      Thank you for allowing me to participate in the care of Katelyn Caldwell. If you have any questions or concerns, please do not hesitate to contact us.    Stanly Si M.England Greb,MD,FACG    All recent labs and radiologic studies were reviewed at the time of the visit.   Thank you for allowing me to participate in the care of your patient.     Follow-up date to be determined as needed after the diagnostic test/procedure results are back.    Continue follow up with primary physician but return earlier if symptoms worsen or fail to improve.

## 2022-06-21 ENCOUNTER — Telehealth (INDEPENDENT_AMBULATORY_CARE_PROVIDER_SITE_OTHER): Payer: Self-pay | Admitting: Family Medicine

## 2022-06-21 NOTE — Telephone Encounter (Signed)
Lvm to schedule     Appointment Request  (Newest Message First)  Katelyn Caldwell  P Pp [facility] Desk17 hours ago (3:42 PM)       Appointment Request From: Katelyn Caldwell     With Provider: Raj Janus, MD [facility]]     Preferred Date Range: 07/14/2022 - 07/19/2022     Preferred Times: Monday Afternoon, Tuesday Afternoon, Wednesday Afternoon, Thursday Afternoon, Friday Afternoon     Reason for visit: diabetes 3 month check     Comments:  A1C

## 2022-07-10 ENCOUNTER — Encounter (INDEPENDENT_AMBULATORY_CARE_PROVIDER_SITE_OTHER): Payer: Self-pay

## 2022-07-13 ENCOUNTER — Other Ambulatory Visit (INDEPENDENT_AMBULATORY_CARE_PROVIDER_SITE_OTHER): Payer: Self-pay | Admitting: Family Medicine

## 2022-07-13 DIAGNOSIS — E119 Type 2 diabetes mellitus without complications: Secondary | ICD-10-CM

## 2022-07-14 MED ORDER — VITAMIN D (CHOLECALCIFEROL) 25 MCG (1000 UT) PO TABS
1000.0000 [IU] | ORAL_TABLET | Freq: Every day | ORAL | 3 refills | Status: AC
Start: 2022-07-14 — End: ?

## 2022-07-14 MED ORDER — METOPROLOL TARTRATE 25 MG PO TABS
25.0000 mg | ORAL_TABLET | Freq: Every day | ORAL | 3 refills | Status: DC
Start: 2022-07-14 — End: 2023-08-09

## 2022-07-14 MED ORDER — FLUTICASONE PROPIONATE 50 MCG/ACT NA SUSP
2.0000 | Freq: Every day | NASAL | 3 refills | Status: DC
Start: 2022-07-14 — End: 2022-12-08

## 2022-07-14 MED ORDER — CVS VITAMIN B-12 1000 MCG PO TABS
500.0000 ug | ORAL_TABLET | Freq: Every day | ORAL | 3 refills | Status: DC
Start: 2022-07-14 — End: 2023-11-24

## 2022-07-14 MED ORDER — PANTOPRAZOLE SODIUM 40 MG PO TBEC
40.0000 mg | DELAYED_RELEASE_TABLET | Freq: Every evening | ORAL | 3 refills | Status: DC
Start: 2022-07-14 — End: 2023-11-24

## 2022-07-14 MED ORDER — JANUMET XR 50-500 MG PO TB24
1.0000 | ORAL_TABLET | Freq: Two times a day (BID) | ORAL | 3 refills | Status: DC
Start: 2022-07-14 — End: 2023-08-20

## 2022-07-14 MED ORDER — FREESTYLE LITE TEST VI STRP
ORAL_STRIP | 3 refills | Status: DC
Start: 2022-07-14 — End: 2023-08-20

## 2022-07-18 ENCOUNTER — Ambulatory Visit (INDEPENDENT_AMBULATORY_CARE_PROVIDER_SITE_OTHER): Payer: Medicare Other | Admitting: Family Medicine

## 2022-07-22 ENCOUNTER — Other Ambulatory Visit (INDEPENDENT_AMBULATORY_CARE_PROVIDER_SITE_OTHER): Payer: Self-pay | Admitting: Gastroenterology

## 2022-07-22 ENCOUNTER — Encounter (INDEPENDENT_AMBULATORY_CARE_PROVIDER_SITE_OTHER): Payer: Self-pay

## 2022-07-22 DIAGNOSIS — R160 Hepatomegaly, not elsewhere classified: Secondary | ICD-10-CM

## 2022-07-22 DIAGNOSIS — K862 Cyst of pancreas: Secondary | ICD-10-CM

## 2022-07-27 ENCOUNTER — Ambulatory Visit
Admission: RE | Admit: 2022-07-27 | Discharge: 2022-07-27 | Disposition: A | Discharge status: Home / Self Care | Payer: Medicare Other | Source: Ambulatory Visit | Attending: Gastroenterology | Admitting: Gastroenterology

## 2022-07-27 DIAGNOSIS — K862 Cyst of pancreas: Secondary | ICD-10-CM | POA: Insufficient documentation

## 2022-07-27 DIAGNOSIS — R160 Hepatomegaly, not elsewhere classified: Secondary | ICD-10-CM | POA: Insufficient documentation

## 2022-07-27 MED ORDER — GADOTERATE MEGLUMINE 7.5 MMOL/15ML IV SOLN (CLARISCAN)
12.0000 mL | Freq: Once | INTRAVENOUS | Status: AC | PRN
Start: 2022-07-27 — End: 2022-07-27
  Administered 2022-07-27: 12 mL via INTRAVENOUS

## 2022-08-04 ENCOUNTER — Ambulatory Visit (INDEPENDENT_AMBULATORY_CARE_PROVIDER_SITE_OTHER): Payer: Medicare Other | Admitting: Family Medicine

## 2022-08-04 ENCOUNTER — Encounter (INDEPENDENT_AMBULATORY_CARE_PROVIDER_SITE_OTHER): Payer: Self-pay | Admitting: Family Medicine

## 2022-08-04 VITALS — BP 132/71 | HR 71 | Temp 97.3°F | Ht 63.62 in | Wt 146.0 lb

## 2022-08-04 DIAGNOSIS — E1122 Type 2 diabetes mellitus with diabetic chronic kidney disease: Secondary | ICD-10-CM

## 2022-08-04 DIAGNOSIS — N1831 Chronic kidney disease, stage 3a: Secondary | ICD-10-CM

## 2022-08-04 DIAGNOSIS — K8681 Exocrine pancreatic insufficiency: Secondary | ICD-10-CM

## 2022-08-04 DIAGNOSIS — R350 Frequency of micturition: Secondary | ICD-10-CM

## 2022-08-04 DIAGNOSIS — D1803 Hemangioma of intra-abdominal structures: Secondary | ICD-10-CM

## 2022-08-04 HISTORY — DX: Hemangioma of intra-abdominal structures: D18.03

## 2022-08-04 LAB — URINALYSIS REFLEX TO MICROSCOPIC EXAM - REFLEX TO CULTURE
Bilirubin, UA: NEGATIVE
Blood, UA: NEGATIVE
Glucose, UA: NEGATIVE
Ketones UA: NEGATIVE
Nitrite, UA: POSITIVE — AB
Specific Gravity UA: 1.014 (ref 1.001–1.035)
Urine pH: 6 (ref 5.0–8.0)
Urobilinogen, UA: NORMAL mg/dL

## 2022-08-04 MED ORDER — EMPAGLIFLOZIN 10 MG PO TABS
10.0000 mg | ORAL_TABLET | Freq: Every morning | ORAL | 3 refills | Status: DC
Start: 2022-08-04 — End: 2023-08-09

## 2022-08-04 NOTE — Progress Notes (Signed)
Chief Complaint   Patient presents with    Diabetes Follow-up         S:      Problem   Hemangioma of Liver    11/23:  confirmed with [place] with contrast.      Type 2 Diabetes Mellitus With Diabetic Chronic Kidney Disease    11/23:  will be seeing dietician.       Exocrine Pancreatic Insufficiency    Scheduled for procedure on 03/30/22    1/23:  Dx in 2/22.  Had been following weith Dr. Arneta Cliche who is nowe retiring.  Will need new referral.  3/23:  Patient has requested a new prescription from GI about 1 month prior and has yet to have the medication prescribed.  Despite multiple attempts to communicate with them she is going to run out of her medication.    11/23:  completed US without contrast.  Lesion in liver noted to be a benign hemangioma.  Pancreatic bx forthcoming.  11/13 for bx.  Now off creon and feeling slightly improved.       Liver Mass (Resolved)   E. Coli Uti (Resolved)       Allergies   Allergen Reactions    Ciprofloxacin Other (See Comments)     Severe Connective Tissue (joint) pain and stiffness    Nsaids Other (See Comments)     Pt states this causes her bleeding d/t PAN       Current Outpatient Medications   Medication Sig Dispense Refill    atorvastatin (LIPITOR) 40 MG tablet Take 2 tablets (80 mg) by mouth nightly      cyanocobalamin ([employer] VITAMIN B12) 1000 MCG tablet Take 0.5 tablets (500 mcg) by mouth daily 45 tablet 3    fluticasone (FLONASE) 50 MCG/ACT nasal spray 2 sprays by Nasal route every evening 16 g 3    glucose blood (FREESTYLE LITE) test strip Check blood sugar once daily 100 strip 3    Lancets (freestyle) lancets Check blood sugar once daily 100 each 3    lidocaine (Lidoderm) 5 % Place 1 patch onto affected skin every twelve hours as needed for discomfort.  Remove each patch after 12 hours of use 30 patch 3    losartan (COZAAR) 25 MG tablet Take 1 tablet (25 mg) by mouth daily 90 tablet 3    Magnesium 400 MG Tab Take 0.5 tablets (200 mg) by mouth 2 (two) times daily slowmag       metFORMIN (GLUCOPHAGE-XR) 500 MG 24 hr tablet Take 1 tablet (500 mg) by mouth nightly 90 tablet 3    metoprolol tartrate (LOPRESSOR) 25 MG tablet Take 1 tablet (25 mg) by mouth daily 100 tablet 3    pantoprazole (PROTONIX) 40 MG tablet Take 1 tablet (40 mg) by mouth every evening 100 tablet 3    Probiotic Product (PROBIOTIC-10 PO) Take by mouth daily      SITagliptin-MetFORMIN HCl (Janumet XR) 50-500 MG Tablet SR 24 hr Take 1 tablet by mouth 2 (two) times daily with meals 180 tablet 3    vitamin D (CHOLECALCIFEROL) 25 MCG (1000 UT) tablet Take 1 tablet (1,000 Units) by mouth daily 100 tablet 3    Creon 36000-114000 units Cap DR Particles Take 1 capsule by mouth 4 (four) times daily (Patient not taking: Reported on 08/04/2022) 120 capsule 12    empagliflozin (Jardiance) 10 MG tablet Take 1 tablet (10 mg) by mouth every morning 90 tablet 3    fluticasone (FLONASE) 50 MCG/ACT nasal spray  2 sprays by Nasal route daily (Patient not taking: Reported on 08/04/2022) 16 g 3     No current facility-administered medications for this visit.       ROS    All other systems were reviewed and are negative    O:  BP 132/71 (BP Site: Right arm, Patient Position: Sitting, Cuff Size: Medium)   Pulse 71   Temp 97.3 F (36.3 C) (Temporal)   Ht 1.616 m (5' 3.62")   Wt 66.2 kg (146 lb)   SpO2 96%   BMI 25.36 kg/m , Body mass index is 25.36 kg/m.  Vital signs reviewed  GEN:  NAD, nonobese  NEURO:  CN2-12 without focal deficit  HEENT:  normal eyes  PULM:  unlabored respirations  SKIN:  dry, no visible rash      A/P:     1. Type 2 diabetes mellitus with stage 3a chronic kidney disease, without long-term current use of insulin  Start empagliflozin (Jardiance) 10 MG tablet      2. Exocrine pancreatic insufficiency        3. Hemangioma of liver        4. Urinary frequency  Urinalysis Reflex to Microscopic Exam- Reflex to Culture          Risk & Benefits of the new medication(s) were explained to the patient (and family) who verbalized  understanding & agreed to the treatment plan. Patient (family) encouraged to contact me/clinical staff with any questions/concerns    Raj Janus, MD

## 2022-08-05 ENCOUNTER — Encounter (INDEPENDENT_AMBULATORY_CARE_PROVIDER_SITE_OTHER): Payer: Self-pay | Admitting: Family Medicine

## 2022-08-09 ENCOUNTER — Other Ambulatory Visit (INDEPENDENT_AMBULATORY_CARE_PROVIDER_SITE_OTHER): Payer: Self-pay | Admitting: Family Medicine

## 2022-08-09 ENCOUNTER — Encounter (INDEPENDENT_AMBULATORY_CARE_PROVIDER_SITE_OTHER): Payer: Self-pay | Admitting: Family Medicine

## 2022-08-09 ENCOUNTER — Encounter (INDEPENDENT_AMBULATORY_CARE_PROVIDER_SITE_OTHER): Payer: Self-pay | Admitting: Registered"

## 2022-08-09 ENCOUNTER — Telehealth (INDEPENDENT_AMBULATORY_CARE_PROVIDER_SITE_OTHER): Payer: Medicare Other | Admitting: Registered"

## 2022-08-09 DIAGNOSIS — E119 Type 2 diabetes mellitus without complications: Secondary | ICD-10-CM

## 2022-08-09 DIAGNOSIS — N390 Urinary tract infection, site not specified: Secondary | ICD-10-CM

## 2022-08-09 DIAGNOSIS — N3000 Acute cystitis without hematuria: Secondary | ICD-10-CM

## 2022-08-09 HISTORY — DX: Urinary tract infection, site not specified: N39.0

## 2022-08-09 MED ORDER — SULFAMETHOXAZOLE-TRIMETHOPRIM 800-160 MG PO TABS
1.0000 | ORAL_TABLET | Freq: Two times a day (BID) | ORAL | 0 refills | Status: AC
Start: 2022-08-09 — End: 2022-08-19

## 2022-08-09 NOTE — Progress Notes (Signed)
[facility]  Telemedicine Visit  via HIPAA compliant secure video link   Date 08/09/22 (Start time 1255- End [id number])    Originating site (Patient location): Home   Distant site (Provider location): [facility]  Provider and Title: Lysbeth Penner, RD, CDCES  Consent obtained: Yes (verbal)   Language: [facility]  MEDICAL NUTRITION THERAPY ASSESSMENT:    Referral: Katelyn Caldwell is 71 y.o.female was referred by Dr. Macon Large for Medical Nutrition Therapy for Type 2 Diabetes.     Client history: Patient's medical history includes Type 2 diabetes (dx 2005), CKD3, pancreatic cyst (pending biopsy), h/o GDM, PAN (prolonged steroid use, no longer on steroids). She has had previous education for this problem. She is concerned because her A1c has crossed 7.0% and is at 7.1% and wants to refresh on things to adjust to lower A1c. Pt is very compliant with diabetes care and management. The patient's main concern/goal today is to reduce A1c.    S/he reports the following:  Sleep: good  Stress: low  Activity: walks 20 min after dinner, elliptical/class 3-5x/week (20-30 min), gardening  Emotional issues: denies anxiety and depression (notes situational anxiety if she is driving on the highway)  Annual foot exam? No- will schedule   Annual eye exam? yes    Pertinent Meds:   Metformin 500 mg at night  Janumet  Jardiance (pending start date of 8/15 after pancreatic cyst biopsy)  Lipitor 80 mg    Pertinent Labs:   A1c:  08/04/22: 7.2%  03/14/22: 7.1%  12/13/21: 7.2%    SMBG Readings Past Two Weeks (logbook/patient report): checks fasting daily  FBG:  137 mg/dl (  usually 540J)- currently a little higher than normal as she is treating UTI.     Anthropometrics:  Estimated body mass index is 24.55 kg/m as calculated from the following:    Height as of this encounter: 1.626 m (5\' 4" ).    Weight as of this encounter: 64.9 kg (143 lb).    Food allergies: nkfa    Dietary recall/Food log info: 3 meals, 1  snack  Breakfast: 8:30am-  steel cut oatmeal (1/3 cup pre cooked) with frozen raspberries. 10 fl oz coffee with 2% milk  Lunch: 12:30/1pm-  green salad with avocado, 1 hard boiled egg. 1 slice wheat toast. Water  Pm snack: 1 piece of fruit with cheese or yogurt  Dinner: 6/7pm- roasted vegetables in olive oil, 1/2 potato/ 1/2 cup rice or corn, steak/chicken. Water (sometimes 1 glass of wine)    Intervention:                           Education provided:   A1c target goals  Education on risk factors for diabetes  Meal and snack timing recommendations  Exercise timing recommendations  How her anti-diabetic medications work    Education Plan:   Individual support as needed    Goals:   Reduce A1c to 7.0% or below    Barriers:   Age  Genetic predisposition  H/o prolonged steroid use    Understanding and Motivation to follow recommendations: good     Monitoring/Evaluation:     Follow up: as needed    Progress: monitor A1c, blood sugars

## 2022-08-10 ENCOUNTER — Ambulatory Visit (INDEPENDENT_AMBULATORY_CARE_PROVIDER_SITE_OTHER): Payer: Medicare Other

## 2022-08-10 ENCOUNTER — Encounter (INDEPENDENT_AMBULATORY_CARE_PROVIDER_SITE_OTHER): Payer: Self-pay
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
## 2022-08-14 NOTE — H&P (Signed)
GI PRE PROCEDURE NOTE    Proceduralist Comments:   Review of Systems and Past Medical / Surgical History performed: Yes     Indications:Abnormal imaging    Previous Adverse Reaction to Anesthesia or Sedation (if yes, describe): No    Physical Exam / Laboratory Data (If applicable)   General: Alert and cooperative  Lungs: Lungs clear to auscultation  Cardiac: RRR, normal S1S2.    Abdomen: Soft, non tender. Normal active bowel sounds  Other:     Outside labs reviewed    American Society of Anesthesiologists (ASA) Physical Status Classification:   Anesthesia ASA Score:           Planned Sedation:   Deep sedation with anesthesia    Attestation:   Katelyn Caldwell has been reassessed immediately prior to the procedure and is an appropriate candidate for the planned sedation and procedure. Risks, benefits and alternatives to the planned procedure and sedation have been explained to the patient or guardian:  yes        Signed by: Alwyn Ren, MD

## 2022-08-15 ENCOUNTER — Ambulatory Visit: Payer: Medicare Other | Admitting: Registered Nurse

## 2022-08-15 ENCOUNTER — Ambulatory Visit
Admission: RE | Admit: 2022-08-15 | Discharge: 2022-08-15 | Disposition: A | Discharge status: Home / Self Care | Payer: Medicare Other | Source: Ambulatory Visit | Attending: Gastroenterology | Admitting: Gastroenterology

## 2022-08-15 ENCOUNTER — Encounter
Admission: RE | Disposition: A | Discharge status: Home / Self Care | Payer: Self-pay | Source: Ambulatory Visit | Attending: Gastroenterology

## 2022-08-15 ENCOUNTER — Ambulatory Visit: Payer: Self-pay

## 2022-08-15 DIAGNOSIS — R933 Abnormal findings on diagnostic imaging of other parts of digestive tract: Secondary | ICD-10-CM | POA: Insufficient documentation

## 2022-08-15 DIAGNOSIS — K862 Cyst of pancreas: Secondary | ICD-10-CM | POA: Insufficient documentation

## 2022-08-15 DIAGNOSIS — K219 Gastro-esophageal reflux disease without esophagitis: Secondary | ICD-10-CM

## 2022-08-15 DIAGNOSIS — R160 Hepatomegaly, not elsewhere classified: Secondary | ICD-10-CM | POA: Insufficient documentation

## 2022-08-15 HISTORY — PX: ESOPHAGOGASTRODUODENOSCOPY (EGD), ESOPHAGEAL ULTRASOUND (EUS), FINE N): SHX50002

## 2022-08-15 LAB — GLUCOSE WHOLE BLOOD - POCT: Whole Blood Glucose POCT: 118 mg/dL — ABNORMAL HIGH (ref 70–100)

## 2022-08-15 SURGERY — ESOPHAGOGASTRODUODENOSCOPY (EGD), ESOPHAGEAL ULTRASOUND (EUS), FINE NEEDLE ASPIRATION (FNA)
Anesthesia: Anesthesia General | Site: Esophagus

## 2022-08-15 MED ORDER — PROPOFOL 10 MG/ML IV EMUL (WRAP)
INTRAVENOUS | Status: AC
Start: 2022-08-15 — End: ?
  Filled 2022-08-15: qty 20

## 2022-08-15 MED ORDER — GLYCOPYRROLATE 0.2 MG/ML IJ SOLN (WRAP)
INTRAMUSCULAR | Status: AC
Start: 2022-08-15 — End: ?
  Filled 2022-08-15: qty 1

## 2022-08-15 MED ORDER — LIDOCAINE HCL 2 % IJ SOLN
INTRAMUSCULAR | Status: DC | PRN
Start: 2022-08-15 — End: 2022-08-15
  Administered 2022-08-15: 50 mg

## 2022-08-15 MED ORDER — EPHEDRINE SULFATE 50 MG/ML IJ/IV SOLN (WRAP)
Status: AC
Start: 2022-08-15 — End: ?
  Filled 2022-08-15: qty 1

## 2022-08-15 MED ORDER — PROPOFOL INFUSION 10 MG/ML
INTRAVENOUS | Status: DC | PRN
Start: 2022-08-15 — End: 2022-08-15
  Administered 2022-08-15: 200 ug/kg/min via INTRAVENOUS

## 2022-08-15 MED ORDER — PROPOFOL 10 MG/ML IV EMUL (WRAP)
INTRAVENOUS | Status: AC
Start: 2022-08-15 — End: ?
  Filled 2022-08-15: qty 50

## 2022-08-15 MED ORDER — GLYCOPYRROLATE 0.2 MG/ML IJ SOLN (WRAP)
INTRAMUSCULAR | Status: DC | PRN
Start: 2022-08-15 — End: 2022-08-15
  Administered 2022-08-15: .2 mg via INTRAVENOUS

## 2022-08-15 MED ORDER — LIDOCAINE HCL (PF) 1 % IJ SOLN
INTRAMUSCULAR | Status: AC
Start: 2022-08-15 — End: ?
  Filled 2022-08-15: qty 5

## 2022-08-15 MED ORDER — LACTATED RINGERS IV SOLN
INTRAVENOUS | Status: DC | PRN
Start: 2022-08-15 — End: 2022-08-15

## 2022-08-15 MED ORDER — SODIUM CHLORIDE (PF) 0.9 % IJ SOLN
INTRAMUSCULAR | Status: AC
Start: 2022-08-15 — End: ?
  Filled 2022-08-15: qty 10

## 2022-08-15 MED ORDER — PROPOFOL 10 MG/ML IV EMUL (WRAP)
INTRAVENOUS | Status: DC | PRN
Start: 2022-08-15 — End: 2022-08-15
  Administered 2022-08-15 (×3): 50 mg via INTRAVENOUS

## 2022-08-15 MED ORDER — STERILE WATER FOR INJECTION IJ/IV SOLN (WRAP)
1.0000 g | INTRAMUSCULAR | Status: DC
Start: 2022-08-15 — End: 2022-08-15
  Administered 2022-08-15: 1 g via INTRAVENOUS
  Filled 2022-08-15: qty 1000

## 2022-08-15 SURGICAL SUPPLY — 49 items
BLOCK BITE MAXI 60FR LF STRD STRAP SDPRT (Procedure Accessories) ×1
BLOCK BITE OD60 FR STURDY STRAP SIDEPORT (Procedure Accessories) ×1
BLOCK BITE OD60 FR STURDY STRAP SIDEPORT DENTAL RETENTION RIM MAXI (Procedure Accessories) ×1 IMPLANT
CANISTER WND DRN 1000ML INFOVAC LF STRL (Procedure Accessories) ×1
CANISTER WOUND DRAINAGE GEL INFOVAC 1000 (Procedure Accessories) ×1
CANISTER WOUND DRAINAGE GEL INFOVAC 1000 ML VAC ULTA THERAPY SYSTEM (Procedure Accessories) ×1 IMPLANT
CONTAINER CYTO CELL FX SLF CNTN MOD (Lab Supplies)
CONTAINER CYTOLOGY CELL FIXATION SELF (Lab Supplies)
CONTAINER CYTOLOGY CELL FIXATION SELF CONTAINED MODIFIED SACCOMANNO (Lab Supplies) IMPLANT
CONTAINER HISTOLOGY 60 ML 30 ML GRADUATE LEAK RESISTANT O RING PREFILL (Procedure Accessories) IMPLANT
CUFF ENDOSCOPIC ULTRASOUND BALLOON (Balloon) ×1
CUFF ENDOSCOPIC ULTRASOUND BALLOON BALLOON3 NATURAL RUBBER (Balloon) ×1 IMPLANT
CUFF ESCP NTR RBR LTX STRL US BLN (Balloon) ×1
FORCEPS BIOPSY L240 CM STANDARD CAPACITY (Disposable Instruments) ×1
FORCEPS BIOPSY L240 CM STANDARD CAPACITY NEEDLE OD2.2 MM RADIAL JAW (Disposable Instruments) IMPLANT
FORCEPS BX STD CPC RJ 4 2.2MM 240CM STRL (Disposable Instruments) ×1
GLOVE EXAM LARGE NITRILE CHEMOTHERAPY POWDER FREE SENSE OATMEAL (Glove) ×1 IMPLANT
GLOVE EXAM LARGE NITRILE POWDER FREE SENSE OATMEAL (Glove) ×1 IMPLANT
GLOVE EXAM NITRILE RESTORE LG (Glove) ×1
GLV EXAM NITRILE RESTORE LG (Glove) ×1
GOWN ISL PP PE REG LG LF FULL BCK NK TIE (Gown) ×1
GOWN ISOLATION REGULAR LARGE FULL BACK NECK TIE ELASTIC CUFF (Gown) ×1 IMPLANT
KIT ENDO W/ ORCA AND SEAL ONLY (Kits) ×1
KIT ENDOSCOPIC COMPLIANCE ENDOKIT (Kits) ×1
KIT ENDOSCOPIC COMPLIANCE ENDOKIT ORCAPOD 3 1.1 OZ (Kits) ×1 IMPLANT
NEEDLE ASP COCR 20CC EXP SLMLN 19GA 1.73 (Needles)
NEEDLE ASP COCR 20CC EXP SLMLN 22GA 1.83 (Needles)
NEEDLE ASP COCR 20CC EXP SLMLN 25GA 1.83 (Needles)
NEEDLE ASPIRATION EXPECT OD19 GA (Needles)
NEEDLE ASPIRATION EXPECT OD19 GA ODSEC1.73 MM L8- CM ECHOGENIC (Needles) IMPLANT
NEEDLE ASPIRATION EXPECT OD22 GA (Needles)
NEEDLE ASPIRATION EXPECT OD22 GA ODSEC1.83 MM L8- CM ECHOGENIC (Needles) IMPLANT
NEEDLE ASPIRATION EXPECT OD25 GA (Needles)
NEEDLE ASPIRATION EXPECT OD25 GA ODSEC1.83 MM L8- CM ECHOGENIC (Needles) IMPLANT
NEEDLE ASPIRATION EXPECTâ„¢ OD19 GA ODSEC1.73 MM L8- CM ECHOGENIC (Needles) IMPLANT
NEEDLE ASPIRATION EXPECTâ„¢ OD22 GA ODSEC1.83 MM L8- CM ECHOGENIC (Needles) IMPLANT
NEEDLE ASPIRATION EXPECTâ„¢ OD25 GA ODSEC1.83 MM L8- CM ECHOGENIC (Needles) IMPLANT
NEEDLE ENDOSCOPIC L8- CM OD22 GA (Needles) ×1
NEEDLE ENDOSCOPIC L8- CM OD22 GA ODSEC1.65 MM NITINOL L2.4 MM BEVEL 1 (Needles) IMPLANT
NEEDLE ESCP NTNL 20ML EXP SLMLN 22GA (Needles) ×1
SOL FORMALIN 10% PREFILL 30ML (Procedure Accessories)
SYRINGE 50 ML GRADUATE NONPYROGENIC DEHP (Syringes, Needles) ×1
SYRINGE 50 ML GRADUATE NONPYROGENIC DEHP FREE PVC FREE BD MEDICAL (Syringes, Needles) ×1 IMPLANT
SYRINGE MED 50ML LF STRL GRAD N-PYRG (Syringes, Needles) ×1
TUBING SCT UNV 3/16IN 12FT LF STRL F (Tubing) ×1
TUBING SUCTION OD3/16 IN L12 FT FEMALE (Tubing) ×1
TUBING SUCTION OD3/16 IN L12 FT FEMALE CONNECTOR RIBBED UNIVERSAL (Tubing) ×1 IMPLANT
WATER STERILE PVC FREE DEHP FREE 1000 ML (Solution) ×1 IMPLANT
WATER STRL 1000ML PIC LF PVC FR DEHP-FR (Solution) ×1

## 2022-08-15 NOTE — Discharge Instructions (Signed)
Gastritis (Adult)  Gastritis is inflammation and irritation of the stomach lining. You can have it for a short time (acute) or it can be long lasting (chronic). Infection with bacteria called H. pylori most often causes gastritis. More than 1 out of 3 people in the [place] have these bacteria in their bodies. In many cases, H. pylori causes no problems or symptoms. But in some people, the infection irritates the stomach lining and causes gastritis. H. pylori may be diagnosed through blood, stool, or breath tests, or by a biopsy during an endoscopy. Other causes of stomach irritation include drinking alcohol, smoking or chewing tobacco, or taking pain-relieving medicines called nonsteroidal anti-inflammatory drugs (NSAIDs), such as aspirin or ibuprofen. Some illegal drugs (such as cocaine) and immune conditions can also cause gastritis.     Symptoms of gastritis can include:  Belly pain or bloating  Feeling full quickly  Loss of appetite  Weight loss  Nausea or vomiting  Vomiting blood or having black stools  Feeling more tired than normal  An inflamed and irritated stomach lining is more likely to develop a sore called an ulcer. To help prevent this, gastritis should be evaluated and treated as soon as symptoms occur.   Home care  If needed, your healthcare provider may prescribe medicines. If you have H. pylori infection, treating it will likely ease your symptoms. Other changes can help reduce stomach irritation and help it heal.   Take prescription medicines as directed. If you have been prescribed medicines for H. pylori infection, take them as directed. Take all of the medicine until it's finished or until your provider tells you to stop taking it, even if you start to feel better.  Follow your healthcare provider's advice on NSAIDs. Your provider may advise you not to take NSAIDs such as ibuprofen. If you take daily aspirin for your heart or other health reasons, don't stop without talking with your provider  first.  Don't drink alcohol. If you need help stopping your use of alcohol, ask your provider for treatment resources.  Stop smoking. Smoking can irritate the stomach and delay healing. As much as possible, stay away from secondhand smoke. If you smoke and have trouble stopping, ask your provider for help.  Follow-up care  Follow up with your healthcare provider, or as advised. You may need testing to check for inflammation or an ulcer.   When to get medical advice  Call your healthcare provider if any of the following occur:   Stomach pain that gets worse or moves to the lower right belly (appendix area)  Chest pain that suddenly appears or gets worse, or spreads to the back, neck, shoulder, or arm  Frequent vomiting (can't keep down liquids)  Blood in the stool or vomit (red or black in color)  Feeling weak or dizzy  Shortness of breath  Unexplained weight loss  Fever of 100.4F (38C) or higher, or as directed by your healthcare provider  Symptoms that get worse, or new symptoms  [employer] last reviewed this educational content on 10/03/2021   2000-2023 [employer]. All rights reserved. This information is not intended as a substitute for professional medical care. Always follow your healthcare professional's instructions.

## 2022-08-15 NOTE — Nursing Progress Note (Signed)
Patient discharge criteria met. Patient tolerating clear liquids. RN Reviewed discharge instructions with patient and family members. RN answered all questions and assessed patient. Patient and family verbalized understandings. Patient vital signs stable with no apparent distress. See charting for detailed documentation. Patient voided. Patient discharged via wheelchair to home.

## 2022-08-15 NOTE — Transfer of Care (Signed)
Anesthesia Transfer of Care Note    Patient: Katelyn Caldwell    Procedures performed: Procedure(s):  ESOPHAGOGASTRODUODENOSCOPY (EGD), ESOPHAGEAL ULTRASOUND (EUS), FINE NEEDLE ASPIRATION (FNA)    Anesthesia type: General TIVA    Patient location:[facility]    Last vitals:   Vitals:    08/15/22 1520   BP: 134/63   Pulse: 76   Resp: (!) 23   Temp: 36.1 C (97 F)   SpO2: 97%       Post pain: Patient not complaining of pain, continue current therapy      Mental Status:awake    Respiratory Function: tolerating room air    Cardiovascular: stable    Nausea/Vomiting: patient not complaining of nausea or vomiting    Hydration Status: adequate    Post assessment: no apparent anesthetic complications, no reportable events, and no evidence of recall    Signed by: Denna Haggard, CRNA  08/15/22 3:20 PM

## 2022-08-15 NOTE — Anesthesia Preprocedure Evaluation (Signed)
Anesthesia Evaluation    AIRWAY    Mallampati: III    TM distance: >3 FB  Neck ROM: full  Mouth Opening:full   CARDIOVASCULAR    cardiovascular exam normal, regular and normal       DENTAL    no notable dental hx               PULMONARY    pulmonary exam normal and clear to auscultation     OTHER FINDINGS                                        Relevant Problems   No relevant active problems               Anesthesia Plan    ASA 3     general               (Appropriately NPO.      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  Stopped vitamin d and calcium supplements being               taken at that time about 1 month ago.  No hx renal                stones, no psychosis, no abdominal pain.  Has occurred                prior with normal ionized calcium on review.   1/23:                 Last evaluation including vitamin D normal about 1 month                ago.  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  Had palpitations which have                reduced with initiation of toprol.  Pt notes at [facility] for                chest pain and URI and underwent EKG. She was told                abnormal and that she has had inferior wall MI given Q                waves in III and AVf.  She feels fine. Denies chest pain.               Sx resolved sponta  No date: Psoriasis      Comment:  scalp  No date: Renal insufficiency      Comment:  stage 3 2/2 pan  No date: Sleep apnea      Comment:  CPAP nightly.  No date: Tricuspid regurgitation      Comment:  trace  08/09/2022: UTI (urinary tract infection)      Comment:  pt started on Bactrim PO for 10 days    Lab Results       Component                Value               Date                       WBC                      4.72                03/14/2022                 HGB                      12.0                03/14/2022                 HCT                      37.8                03/14/2022                 MCV                      88.7                03/14/2022                 PLT                      137 (  L)             03/14/2022              No results found for: "COVID"    Discussed with patient IVGA/deep sedation as indicated with risk to include but not limited to N/V, H/A, sore throat.  Questions answered. )      intravenous induction   Detailed anesthesia plan: general IV  Monitors/Adjuncts: other (Blood pressure cuff, Pulse Oximeter, EKG, ETCO2)    Post Op: other (Endo PACU)    Post op pain  management: per surgeon    informed consent obtained    Plan discussed with CRNA.      pertinent labs reviewed               Signed by: Karie Kirks, MD 08/15/22 2:32 PM

## 2022-08-15 NOTE — Discharge Instr - AVS First Page (Addendum)
Instructions for after your discharge:  General Instructions:    Following sedation, your judgment, perception, and coordination are considered impaired. Even though you may feel awake and alert, you are considered legally intoxicated. Therefore, until the next morning:  Do not drive  Do not operate appliances or equipment that requires reaction time (e.g. stove, electrical tools, machinery)  Do not sign legal documents or be involved in important decisions  Do not smoke if alone  Do not drink alcoholic beverages    Go directly home and rest for several hours before resuming your routine activities  It is highly recommended to have a responsible adult stay with you for the next 24 hours    Tenderness, swelling, or pain may occur at the IV site where you received sedation. If you experience this, apply warm soaks to the area. Notify your physician if this persists.   If you have questions or problems, contact your MD immediately. If you need immediate attention, call your MD, 911 and/or go to the nearest emergency room.       Report to the Physician any of the following: Dr. Abuhamda [phone].    For Upper Endoscopy, or Dilations:  Pain in chest  Nausea/vomiting  Fever/chills within 24 hours after procedure. Temp > 101 degrees Farenheit  Severe and persistent abdominal pain or bloating    * Mild throat soreness may follow this procedure. Warm salt water gargling, lozenges, or cold drink and popscicles of your choice will most likely relieve your discomfort    *Special instructions: Follow MD instructions on procedural report.          During endoscopy, a long, flexible tube is used to view the inside of your upper GI tract.

## 2022-08-16 ENCOUNTER — Encounter: Payer: Self-pay | Admitting: Gastroenterology

## 2022-08-16 NOTE — Anesthesia Postprocedure Evaluation (Signed)
Anesthesia Post Evaluation    Patient: Katelyn Caldwell    Procedure(s):  ESOPHAGOGASTRODUODENOSCOPY (EGD), ESOPHAGEAL ULTRASOUND (EUS), FINE NEEDLE ASPIRATION (FNA)    Anesthesia type: general    Last Vitals:   Vitals Value Taken Time   BP 135/72 08/15/22 1550   Temp 36.1 C (97 F) 08/15/22 1520   Pulse 60 08/15/22 1550   Resp 17 08/15/22 1550   SpO2 98 % 08/15/22 1550                 Anesthesia Post Evaluation:     Patient Evaluated: PACU  Patient Participation: complete - patient participated  Level of Consciousness: sleepy but conscious    Pain Management: adequate    Airway Patency: patent        Anesthetic complications: No      PONV Status: none    Cardiovascular status: acceptable and hemodynamically stable  Respiratory status: acceptable and room air  Hydration status: acceptable  Comments: No apparent anesthetic complications or reportable events          Signed by: Karie Kirks, MD, 08/16/2022 7:53 AM

## 2022-08-19 LAB — LAB USE ONLY - HISTORICAL SURGICAL PATHOLOGY

## 2022-08-23 ENCOUNTER — Encounter (INDEPENDENT_AMBULATORY_CARE_PROVIDER_SITE_OTHER): Payer: Self-pay

## 2022-08-30 ENCOUNTER — Encounter (INDEPENDENT_AMBULATORY_CARE_PROVIDER_SITE_OTHER): Payer: Self-pay | Admitting: Family Medicine

## 2022-08-30 MED ORDER — ATORVASTATIN CALCIUM 80 MG PO TABS
80.0000 mg | ORAL_TABLET | Freq: Every day | ORAL | 3 refills | Status: DC
Start: 2022-08-30 — End: 2023-08-20

## 2022-09-01 ENCOUNTER — Encounter (INDEPENDENT_AMBULATORY_CARE_PROVIDER_SITE_OTHER): Payer: Self-pay | Admitting: Gastroenterology

## 2022-09-06 ENCOUNTER — Encounter (INDEPENDENT_AMBULATORY_CARE_PROVIDER_SITE_OTHER): Payer: Self-pay

## 2022-09-09 ENCOUNTER — Encounter (INDEPENDENT_AMBULATORY_CARE_PROVIDER_SITE_OTHER): Payer: Self-pay

## 2022-09-09 ENCOUNTER — Telehealth: Payer: Self-pay | Admitting: Gastroenterology

## 2022-09-09 NOTE — Telephone Encounter (Addendum)
PT is calling to get results from biopsy.  Please call patient to discuss.

## 2022-09-12 NOTE — Telephone Encounter (Signed)
STP- per pt - she is looking for the results of pancreatic cyst fluid analysis. Advised pt a message will be sent to the provider and we will be back in contact with her. Pt verbalized understanding.

## 2022-09-14 NOTE — Telephone Encounter (Signed)
STP advising what the provider said. Pt verbalized understanding. Stated she understood what the provider had discussed during the phone call. Pt will call a few months prior to when she needs MRI done to schedule.

## 2022-10-10 ENCOUNTER — Encounter (INDEPENDENT_AMBULATORY_CARE_PROVIDER_SITE_OTHER): Payer: Self-pay

## 2022-10-27 ENCOUNTER — Encounter (INDEPENDENT_AMBULATORY_CARE_PROVIDER_SITE_OTHER): Payer: Self-pay

## 2022-10-29 ENCOUNTER — Encounter (INDEPENDENT_AMBULATORY_CARE_PROVIDER_SITE_OTHER): Payer: Self-pay | Admitting: Family Medicine

## 2022-11-10 ENCOUNTER — Encounter (INDEPENDENT_AMBULATORY_CARE_PROVIDER_SITE_OTHER): Payer: Self-pay

## 2022-11-30 ENCOUNTER — Encounter (INDEPENDENT_AMBULATORY_CARE_PROVIDER_SITE_OTHER): Payer: Self-pay | Admitting: Family Medicine

## 2022-12-08 ENCOUNTER — Ambulatory Visit (INDEPENDENT_AMBULATORY_CARE_PROVIDER_SITE_OTHER): Payer: Medicare Other | Admitting: Family Medicine

## 2022-12-08 ENCOUNTER — Encounter (INDEPENDENT_AMBULATORY_CARE_PROVIDER_SITE_OTHER): Payer: Self-pay | Admitting: Family Medicine

## 2022-12-08 VITALS — BP 117/70 | HR 64 | Temp 97.7°F | Ht 63.39 in | Wt 143.8 lb

## 2022-12-08 DIAGNOSIS — I10 Essential (primary) hypertension: Secondary | ICD-10-CM

## 2022-12-08 DIAGNOSIS — M300 Polyarteritis nodosa: Secondary | ICD-10-CM

## 2022-12-08 DIAGNOSIS — E1122 Type 2 diabetes mellitus with diabetic chronic kidney disease: Secondary | ICD-10-CM

## 2022-12-08 DIAGNOSIS — N1831 Chronic kidney disease, stage 3a: Secondary | ICD-10-CM

## 2022-12-08 DIAGNOSIS — D696 Thrombocytopenia, unspecified: Secondary | ICD-10-CM

## 2022-12-08 NOTE — Progress Notes (Signed)
Chief Complaint   Patient presents with    Diabetes Follow-up     3 month A1c check       Depression Screening No data recorded    S:      Problem   Type 2 Diabetes Mellitus With Diabetic Chronic Kidney Disease    11/23:  will be seeing dietician.    3/24:  due for her A1c.       Essential Hypertension    Recently with elevation in her evening BP to ~160-170 SBP despite good hydration and using cozaar regularly.  No smoking.  No NSAIDs.  No excessive EtOH.  Has reduced sodium but to uncertain amount.  Exercising (walking) seems to help.  No decongestants.  Has used cozaar with additional dose with some benefit and we are looking to make that a routine.  1/23:  Reports normal levels at home.  A bit elevated here today.  3/24:  well controlled today.         Allergies   Allergen Reactions    Ciprofloxacin Other (See Comments)     Severe Connective Tissue (joint) pain and stiffness    Nsaids Other (See Comments)     Pt states this causes her bleeding d/t PAN       Current Outpatient Medications   Medication Sig Dispense Refill    atorvastatin (LIPITOR) 80 MG tablet Take 1 tablet (80 mg total) by mouth every evening 90 tablet 3    cyanocobalamin ([employer] VITAMIN B12) 1000 MCG tablet Take 0.5 tablets (500 mcg) by mouth daily 45 tablet 3    empagliflozin (Jardiance) 10 MG tablet Take 1 tablet (10 mg) by mouth every morning 90 tablet 3    fluticasone (FLONASE) 50 MCG/ACT nasal spray 2 sprays by Nasal route every evening 16 g 3    glucose blood (FREESTYLE LITE) test strip Check blood sugar once daily 100 strip 3    Lancets (freestyle) lancets Check blood sugar once daily 100 each 3    lidocaine (Lidoderm) 5 % Place 1 patch onto affected skin every twelve hours as needed for discomfort.  Remove each patch after 12 hours of use 30 patch 3    losartan (COZAAR) 25 MG tablet Take 1 tablet (25 mg) by mouth daily 90 tablet 3    Magnesium 400 MG Tab Take 0.5 tablets (200 mg) by mouth 2 (two) times daily slowmag      metFORMIN  (GLUCOPHAGE-XR) 500 MG 24 hr tablet Take 1 tablet (500 mg) by mouth nightly 90 tablet 3    metoprolol tartrate (LOPRESSOR) 25 MG tablet Take 1 tablet (25 mg) by mouth daily (Patient taking differently: Take 2 tablets (50 mg) by mouth daily) 100 tablet 3    pantoprazole (PROTONIX) 40 MG tablet Take 1 tablet (40 mg) by mouth every evening 100 tablet 3    Probiotic Product (PROBIOTIC-10 PO) Take by mouth daily      SITagliptin-MetFORMIN HCl (Janumet XR) 50-500 MG Tablet SR 24 hr Take 1 tablet by mouth 2 (two) times daily with meals 180 tablet 3    vitamin D (CHOLECALCIFEROL) 25 MCG (1000 UT) tablet Take 1 tablet (1,000 Units) by mouth daily 100 tablet 3    atorvastatin (LIPITOR) 80 MG tablet Take 1 tablet (80 mg) by mouth daily 90 tablet 3     No current facility-administered medications for this visit.       ROS    All other systems were reviewed and are negative    O:  BP 117/70 (BP Site: Left arm, Patient Position: Sitting, Cuff Size: Medium)   Pulse 64   Temp 97.7 F (36.5 C)   Ht 1.61 m (5' 3.39")   Wt 65.2 kg (143 lb 12.8 oz)   SpO2 97%   BMI 25.16 kg/m , Body mass index is 25.16 kg/m.  Vital signs reviewed  GEN:  NAD, nonobese  NEURO:  CN2-12 without focal deficit  HEENT:  normal eyes  PULM:  unlabored respirations  SKIN:  dry, no visible rash      A/P:     1. Essential hypertension  stable      2. PAN (polyarteritis nodosa)  stable      3. Type 2 diabetes mellitus with stage 3a chronic kidney disease, without long-term current use of insulin  Ok  May consider SGLT2i titration in the future      4. Thrombocytopenia              Risk & Benefits of the new medication(s) were explained to the patient (and family) who verbalized understanding & agreed to the treatment plan. Patient (family) encouraged to contact me/clinical staff with any questions/concerns    Midge Minium, MD

## 2022-12-08 NOTE — Progress Notes (Signed)
Have you seen any specialists/other providers since your last visit with [place]?    Yes. Cardio and nephrology      The patient was informed that the following HM items are still outstanding:   Health Maintenance Due   Topic Date Due    Advance Directive on File  Never done    URINE MICROALBUMIN  01/22/2022

## 2022-12-09 ENCOUNTER — Other Ambulatory Visit (INDEPENDENT_AMBULATORY_CARE_PROVIDER_SITE_OTHER): Payer: Self-pay | Admitting: Family Nurse Practitioner

## 2022-12-09 ENCOUNTER — Encounter (INDEPENDENT_AMBULATORY_CARE_PROVIDER_SITE_OTHER): Payer: Self-pay | Admitting: Family Medicine

## 2022-12-09 ENCOUNTER — Encounter (INDEPENDENT_AMBULATORY_CARE_PROVIDER_SITE_OTHER): Payer: Self-pay

## 2022-12-11 ENCOUNTER — Encounter (INDEPENDENT_AMBULATORY_CARE_PROVIDER_SITE_OTHER): Payer: Self-pay

## 2022-12-11 ENCOUNTER — Ambulatory Visit (FREE_STANDING_LABORATORY_FACILITY): Payer: Medicare Other | Admitting: Family Nurse Practitioner

## 2022-12-11 VITALS — BP 109/67 | HR 70 | Temp 98.2°F | Resp 18 | Ht 64.0 in | Wt 145.4 lb

## 2022-12-11 DIAGNOSIS — R399 Unspecified symptoms and signs involving the genitourinary system: Secondary | ICD-10-CM

## 2022-12-11 LAB — MCKESSON POCT UA 120
Bilirubin UA: NEGATIVE
Blood UA: NEGATIVE
Ketone UA: NEGATIVE
Nitrite UA: NEGATIVE
Protein UA: NEGATIVE — AB
Specific Gravity UA: >=1.03
Urobilinogen UA: NEGATIVE
pH UA: 6

## 2022-12-11 NOTE — Progress Notes (Signed)
Urgent Care Provider Note    Patient: Katelyn Caldwell   Date: 12/11/2022   MRN: [id number]       Subjective     Chief Complaint   Patient presents with    Urinary Tract Infection Symptoms     Since yesterday       History of Present illness:  HPI    Deasiah Muramoto is a 72 y.o. female who presents with mild which started yesterday and has been stable. Patient admits to dysuria, urinary frequency, and urinary urgency. Patient denies hematuria, pelvic pain, back pain, fever , and chills.     No LMP recorded. Patient is postmenopausal.    Patient reports that she began Jardiance 4 months ago    No medications taken for symptoms    Pertinent Past Medical, Surgical, Family and Social History were reviewed.      Current Outpatient Medications:     atorvastatin (LIPITOR) 80 MG tablet, Take 1 tablet (80 mg total) by mouth every evening, Disp: 90 tablet, Rfl: 3    cyanocobalamin ([employer] VITAMIN B12) 1000 MCG tablet, Take 0.5 tablets (500 mcg) by mouth daily, Disp: 45 tablet, Rfl: 3    empagliflozin (Jardiance) 10 MG tablet, Take 1 tablet (10 mg) by mouth every morning, Disp: 90 tablet, Rfl: 3    fluticasone (FLONASE) 50 MCG/ACT nasal spray, 2 sprays by Nasal route every evening, Disp: 16 g, Rfl: 3    glucose blood (FREESTYLE LITE) test strip, Check blood sugar once daily, Disp: 100 strip, Rfl: 3    Lancets (freestyle) lancets, Check blood sugar once daily, Disp: 100 each, Rfl: 3    lidocaine (Lidoderm) 5 %, Place 1 patch onto affected skin every twelve hours as needed for discomfort.  Remove each patch after 12 hours of use, Disp: 30 patch, Rfl: 3    losartan (COZAAR) 25 MG tablet, Take 1 tablet (25 mg) by mouth daily, Disp: 90 tablet, Rfl: 3    Magnesium 400 MG Tab, Take 0.5 tablets (200 mg) by mouth 2 (two) times daily slowmag, Disp: , Rfl:     metFORMIN (GLUCOPHAGE-XR) 500 MG 24 hr tablet, Take 1 tablet (500 mg) by mouth nightly, Disp: 90 tablet, Rfl: 3    metoprolol tartrate (LOPRESSOR) 25 MG tablet, Take 1 tablet (25 mg)  by mouth daily (Patient taking differently: Take 2 tablets (50 mg) by mouth daily), Disp: 100 tablet, Rfl: 3    pantoprazole (PROTONIX) 40 MG tablet, Take 1 tablet (40 mg) by mouth every evening, Disp: 100 tablet, Rfl: 3    Probiotic Product (PROBIOTIC-10 PO), Take by mouth daily, Disp: , Rfl:     SITagliptin-MetFORMIN HCl (Janumet XR) 50-500 MG Tablet SR 24 hr, Take 1 tablet by mouth 2 (two) times daily with meals, Disp: 180 tablet, Rfl: 3    vitamin D (CHOLECALCIFEROL) 25 MCG (1000 UT) tablet, Take 1 tablet (1,000 Units) by mouth daily, Disp: 100 tablet, Rfl: 3    atorvastatin (LIPITOR) 80 MG tablet, Take 1 tablet (80 mg) by mouth daily, Disp: 90 tablet, Rfl: 3    Allergies   Allergen Reactions    Ciprofloxacin Other (See Comments)     Severe Connective Tissue (joint) pain and stiffness    Nsaids Other (See Comments)     Pt states this causes her bleeding d/t PAN       Medications and Allergies reviewed.         Objective     Vitals:    12/11/22 [id number]  BP: 109/67   Pulse: 70   Resp: 18   Temp: 98.2 F (36.8 C)   SpO2: 98%       Physical Exam    General: no acute distress, well developed, well nourished.    Heart: regular rate    Lungs: normal work of breathing    Abdomen: no CVA tenderness     Back: No CVA tenderness    UCC COURSE  LABS  The following POCT tests were ordered, reviewed and discussed with the patient/family.     Results       Procedure Component Value Units Date/Time    UA [id number]  (Abnormal) Collected: 12/11/22 1030    Specimen: Clean Catch Updated: 12/11/22 1033     Color, UA Light Yellow     Clarity, UA Slightly Cloudy     Leukocytes UA Trace (15 Leu/ul)     Nitrite UA Negative     Urobilinogen UA Negative (0.2 mg/dl)     Protein UA Negative     pH UA 6.0     Blood UA Negative     Specific Gravity UA >=1.030     Ketone UA Negative     Bilirubin UA Negative     Glucose UA 2+ ('500mg'$ /dl)            There were no x-rays reviewed with this patient during the visit.    No current  facility-administered medications for this visit.             PROCEDURES:  Procedures    MDM:  Risks and benefits of therapy discussed,  Patient/family agrees and understands.  Well appearing, no red flags,  stable for home discharge  Follow up with primary care provider within 3 days, or follow up in clinic if symptoms worsening or not resolving as expected   ED precautions reviewed, If new, persistent or worsening signs or symptoms, call 911 or go to nearest Emergency Room  Understanding of plan of care  was verbalized  Discharge instructions discussed,and  no further questions/concerns at time of discharge.           Assessment         Taycee was seen today for urinary tract infection symptoms.    Diagnoses and all orders for this visit:    UTI symptoms  -     UA  -     Urine culture    Patient is UA does not indicate urinary tract infection at this time.  Urine culture has been sent to the lab.  Discussed with patient the the option to empirically treat for UTI with antibiotics while urine culture is pending or wait until culture returns for treatment.  Patient reports that she would like to wait until culture returns for antibiotic treatment if needed at that time.  Advised that she increase hydration.  Return for reevaluation if symptoms worsen and do not improve. At time of discharge patient is awake, alert and ambulatory         Plan and follow-up discussed with patient. See AVS for further documentation.

## 2022-12-13 ENCOUNTER — Other Ambulatory Visit (INDEPENDENT_AMBULATORY_CARE_PROVIDER_SITE_OTHER): Payer: Self-pay | Admitting: Family Nurse Practitioner

## 2022-12-13 MED ORDER — CEFUROXIME AXETIL 500 MG PO TABS
500.0000 mg | ORAL_TABLET | Freq: Two times a day (BID) | ORAL | 0 refills | Status: AC
Start: 2022-12-13 — End: 2022-12-23

## 2022-12-17 ENCOUNTER — Encounter (INDEPENDENT_AMBULATORY_CARE_PROVIDER_SITE_OTHER): Payer: Self-pay | Admitting: Family Medicine

## 2023-01-09 ENCOUNTER — Encounter (INDEPENDENT_AMBULATORY_CARE_PROVIDER_SITE_OTHER): Payer: Self-pay

## 2023-02-08 ENCOUNTER — Encounter (INDEPENDENT_AMBULATORY_CARE_PROVIDER_SITE_OTHER): Payer: Self-pay

## 2023-02-09 ENCOUNTER — Encounter (INDEPENDENT_AMBULATORY_CARE_PROVIDER_SITE_OTHER): Payer: Self-pay

## 2023-03-08 NOTE — Progress Notes (Unsigned)
[facility] Breast [facility] Breast Cancer Follow Up    Treatment Team  Ref Prov:    Primary Care Physician:   Raj Janus, MD Radiology facility: [facility])--[phone] Breast Surgeon : Henderson Baltimore MD  [phone]   Plastic Surgeon none Radiation Onc:  Miguel Dibble MD:  [phone] [facility] [place]); Medical Onc:  Laney Pastor, MD [phone] [facility] [place])        Breast Cancer Comprehensive Care Plan Summary  Date of diagnosis: 11/03/2018 Event : DCIS ECOG Performance: Grade 0  (Fully active) Genetic Testing :not indicated   Presentation : Abnormal mammogram with microcalcifications Side: left Focality :  unifocal Location: radian 12:00   Biologic Tumor characteristics  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 Ki-67:  DCIS - not indicated Oncotype Dx:  DCIS , not indicated   Estrogen Receptor: positive 19 % Progesterone Receptor: negative Her-2-neu Receptor: DCIS, not indicated Androgen Receptor;  not done   Staging  Tumor Size:   DCIS with <65mm microinvasion Nodes: clinically/US negative Systemic Metastases: clinically negative Stage: Stage0 , Tmic N0 M0   Treatment  Modality Date     Surgery    12/25/18     01/15/2019 left, partial mastectomy with needle localization     Re-excision lumpectomy    Radiation    Whole breast radiation completed on 04/02/2019   Endocrine    not felt to be warranted as the patient was only weakly ER positive   Chemotherapy       Biologic       Clinical trial            HPI   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 female patient here for follow up of her history of left breast cancer originally diagnosed in February 2020.  She is currently on No endocrine therapy    The patient is asymptomatic and denies any persistent breast pain, palpable masses, skin or nipple changes or nipple discharge.     She does reports cysts in her pancreas and is being followed by GI. She also reports a change in her  diabetes medicine.     She reports no change in her family history since her last visit.    Recent Imaging:  LDM 07/18/19: No mammographic evidence for malignancy. BIRADS2    BDM 11/06/19: Left lumpectomy changes with No mammographic evidence for malignancy. BIRADS2    BDM 11/06/20: No mammographic evidence for malignancy. BIRADS2    BDM 11/24/21: No mammographic evidence for malignancy. BIRADS2    BDM 12/09/2022: No mammographic evidence for malignancy.  BIRADS2    The following portions of the patient's history were reviewed and updated as appropriate: allergies, current medications, past family history, past medical history, past social history, past surgical history and problem list.     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    Family History     Family History   Problem Relation Age of  Onset    Diabetes Mother     Thyroid disease Mother     No known problems Father         Review of Systems   See scan.     Physical Exam   BP 120/76   Pulse 63   Temp 97.7 F (36.5 C)   Wt 64 kg (141 lb)   SpO2 97%   BMI 24.20 kg/m   WDWN female in NAD  HEENT:  Clear, no scleral icterus, neck supple  Neck:  No thyromegaly or masses, trachea midline  Chest:  Respiratory effort normal  BJM:   Gait and station normal  Ext:  No CCE  Skin:  Free of significant ulcers or lesions  Neuro:  Grossly intact, no focal findings, alert and oriented, asks appropriate questions  LN:  No axillary, supraclavicular or cervical adenopathy  Breast:  Symmetric .    Right breast: No skin or nipple changes, no nipple retraction or discharge . No palpable masses    Left breast : No skin or nipple changes, no nipple retraction or discharge . No palpable masses. Well healed incision.    Rads   Radiological imaging and reports reviewed as above.     Assessment/Plan   Personal history of breast carcinoma    Patient is doing well. She currently has no evidence of disease .  Her physical and emotional recovery is good. Cosmetic outcome is good .  I have recommended continued treatment with Serial imaging.    Surveillance / Folllow up recommendations:    I reviewed the importance of breast exams and follow up at six month intervals for the first five years after the first treatment; alternating exams at 6 month intervals with medical oncology is a good option between years three and five to limit the number of times needed for follow up ; and every year thereafter.  I have recommended long term follow-up because there is a low but measurable risk of breast cancer recurrence even more than 10 years after treatment. In addition, if  there is remaining breast tissue, there is a higher risk to develop a new, independent, breast cancer at some time in the future.    After lumpectomy, we recommend mammograms once a year. The purpose is to monitor for local recurrence and detection of new primary.    Patient is advised to report these symptoms : new lumps, bone pain, chest pain, shortness of breath or difficulty breathing, abdominal pain, or persistent headaches.     The following tests are not recommended for routine breast cancer follow-up: breast MRI, FDG-PET scans, complete blood cell counts, automated chemistry studies, chest x-rays,  bone scans, liver ultrasound, and tumor markers (CA 15-3, CA 27.29, CEA).     Nutrition  I have also discussed life-style choices to decrease risk of breast cancer such as:   Healthy eating habits, meals high in fruits, vegetables, nuts and complex carbohydrates, low in animal fats.  Limit the amount of alcohol ingested.  Avoid  Hormone Replacement Therapy.    Exercise  Continue to exercise, aerobic 30 - 40 minutes , 3 -4 times / week.    Follow up  Continue healthy lifestyle.   Bilateral mammogram in April 2025.  Follow up visit with me in 12 months, sooner if concerns.   Follow up with PCP for routine health maintenance.  Patient agrees with plan.

## 2023-03-09 ENCOUNTER — Ambulatory Visit (HOSPITAL_BASED_OUTPATIENT_CLINIC_OR_DEPARTMENT_OTHER): Payer: Medicare Other | Admitting: Registered Nurse

## 2023-03-11 ENCOUNTER — Encounter (INDEPENDENT_AMBULATORY_CARE_PROVIDER_SITE_OTHER): Payer: Self-pay

## 2023-03-12 ENCOUNTER — Encounter (INDEPENDENT_AMBULATORY_CARE_PROVIDER_SITE_OTHER): Payer: Self-pay

## 2023-03-15 ENCOUNTER — Encounter (HOSPITAL_BASED_OUTPATIENT_CLINIC_OR_DEPARTMENT_OTHER): Payer: Self-pay | Admitting: Registered Nurse

## 2023-03-16 ENCOUNTER — Ambulatory Visit (INDEPENDENT_AMBULATORY_CARE_PROVIDER_SITE_OTHER): Payer: Medicare Other | Admitting: Registered Nurse

## 2023-03-16 ENCOUNTER — Encounter (HOSPITAL_BASED_OUTPATIENT_CLINIC_OR_DEPARTMENT_OTHER): Payer: Self-pay | Admitting: Registered Nurse

## 2023-03-16 VITALS — BP 120/76 | HR 63 | Temp 97.7°F | Wt 141.0 lb

## 2023-03-16 DIAGNOSIS — Z853 Personal history of malignant neoplasm of breast: Secondary | ICD-10-CM

## 2023-03-28 ENCOUNTER — Encounter (INDEPENDENT_AMBULATORY_CARE_PROVIDER_SITE_OTHER): Payer: Self-pay | Admitting: Family Medicine

## 2023-04-02 ENCOUNTER — Encounter (INDEPENDENT_AMBULATORY_CARE_PROVIDER_SITE_OTHER): Payer: Self-pay | Admitting: Family Nurse Practitioner

## 2023-04-02 ENCOUNTER — Ambulatory Visit (INDEPENDENT_AMBULATORY_CARE_PROVIDER_SITE_OTHER): Payer: Medicare Other | Admitting: Family Nurse Practitioner

## 2023-04-02 VITALS — BP 132/79 | HR 77 | Temp 97.8°F | Resp 18 | Ht 64.0 in | Wt 137.0 lb

## 2023-04-02 DIAGNOSIS — L249 Irritant contact dermatitis, unspecified cause: Secondary | ICD-10-CM

## 2023-04-02 MED ORDER — TRIAMCINOLONE ACETONIDE 0.1 % EX OINT
TOPICAL_OINTMENT | Freq: Two times a day (BID) | CUTANEOUS | 0 refills | Status: AC
Start: 2023-04-02 — End: 2023-04-09

## 2023-04-02 NOTE — Progress Notes (Signed)
[facility]  URGENT  CARE  PROGRESS NOTE     Patient: Katelyn Caldwell   Date: 04/02/2023   MRN: [id number]       Katelyn Caldwell is a 72 y.o. female      HISTORY     History obtained from: Patient    Chief Complaint   Patient presents with    Rash     Pt c/o rash on back. Rash began over a month ago.           Rash  This is a new problem. The current episode started 1 to 4 weeks ago (1 month). The problem has been gradually improving since onset. The affected locations include the back. The rash is characterized by itchiness and redness. It is unknown if there was an exposure to a precipitant. Pertinent negatives include no anorexia, congestion, cough, diarrhea, eye pain, facial edema, fatigue, fever, joint pain, nail changes, rhinorrhea, shortness of breath, sore throat or vomiting. Past treatments include anti-itch cream. The treatment provided mild relief. There is no history of allergies, asthma, eczema or varicella.        Review of Systems   Constitutional:  Negative for fatigue and fever.   HENT:  Negative for congestion, rhinorrhea and sore throat.    Eyes:  Negative for pain.   Respiratory:  Negative for cough and shortness of breath.    Gastrointestinal:  Negative for anorexia, diarrhea and vomiting.   Musculoskeletal:  Negative for joint pain.   Skin:  Positive for rash. Negative for nail changes.       History:    Pertinent Past Medical, Surgical, Family and Social History were reviewed.        Current Outpatient Medications:     empagliflozin (Jardiance) 10 MG tablet, Take 1 tablet (10 mg) by mouth every morning, Disp: 90 tablet, Rfl: 3    losartan (COZAAR) 25 MG tablet, Take 1 tablet (25 mg) by mouth daily, Disp: 90 tablet, Rfl: 3    metFORMIN (GLUCOPHAGE-XR) 500 MG 24 hr tablet, Take 1 tablet (500 mg) by mouth nightly, Disp: 90 tablet, Rfl: 3    SITagliptin-MetFORMIN HCl (Janumet XR) 50-500 MG Tablet SR 24 hr, Take 1 tablet by mouth 2 (two) times daily with meals, Disp: 180 tablet, Rfl: 3     atorvastatin (LIPITOR) 80 MG tablet, Take 1 tablet (80 mg) by mouth daily, Disp: 90 tablet, Rfl: 3    cyanocobalamin ([employer]S VITAMIN B12) 1000 MCG tablet, Take 0.5 tablets (500 mcg) by mouth daily, Disp: 45 tablet, Rfl: 3    fluticasone (FLONASE) 50 MCG/ACT nasal spray, 2 sprays by Nasal route every evening, Disp: 16 g, Rfl: 3    glucose blood (FREESTYLE LITE) test strip, Check blood sugar once daily, Disp: 100 strip, Rfl: 3    Lancets (freestyle) lancets, Check blood sugar once daily, Disp: 100 each, Rfl: 3    lidocaine (Lidoderm) 5 %, Place 1 patch onto affected skin every twelve hours as needed for discomfort.  Remove each patch after 12 hours of use, Disp: 30 patch, Rfl: 3    Magnesium 400 MG Tab, Take 0.5 tablets (200 mg) by mouth 2 (two) times daily slowmag, Disp: , Rfl:     metoprolol tartrate (LOPRESSOR) 25 MG tablet, Take 1 tablet (25 mg) by mouth daily (Patient taking differently: Take 2 tablets (50 mg) by mouth daily), Disp: 100 tablet, Rfl: 3    pantoprazole (PROTONIX) 40 MG tablet, Take 1 tablet (40 mg) by mouth every  evening, Disp: 100 tablet, Rfl: 3    Probiotic Product (PROBIOTIC-10 PO), Take by mouth daily, Disp: , Rfl:     triamcinolone (KENALOG) 0.1 % ointment, Apply topically 2 (two) times daily for 7 days, Disp: 454 g, Rfl: 0    vitamin D (CHOLECALCIFEROL) 25 MCG (1000 UT) tablet, Take 1 tablet (1,000 Units) by mouth daily, Disp: 100 tablet, Rfl: 3    Allergies   Allergen Reactions    Ciprofloxacin Other (See Comments)     Severe Connective Tissue (joint) pain and stiffness    Nsaids Other (See Comments)     Pt states this causes her bleeding d/t PAN       Medications and Allergies reviewed.    PHYSICAL EXAM     Vitals:    04/02/23 0938   BP: 132/79   Pulse: 77   Resp: 18   Temp: 97.8 F (36.6 C)   SpO2: 97%   Weight: 62.1 kg (137 lb)   Height: 1.626 m (5\' 4" )       Physical Exam  Skin:     Findings: Rash present. Rash is urticarial.                UCC COURSE     There were no labs reviewed with  this patient during the visit.    There were no x-rays reviewed with this patient during the visit.    No current facility-administered medications for this visit.       PROCEDURES     Procedures    MEDICAL DECISION MAKING     History, physical, labs/studies most consistent with contact dermatitis as the diagnosis.        Chart Review:  Prior PCP, Specialist and/or ED notes reviewed today: No  Prior labs/images/studies reviewed today: No    Differential Diagnosis: allergic dermatitis, shingles      ASSESSMENT     Encounter Diagnosis   Name Primary?    Irritant contact dermatitis, unspecified trigger Yes                PLAN      PLAN:     Patient symptoms are consistent with contact dermatitis.  Advised the patient use triamcinolone 0.1% topically twice a day on damp skin.  Discouraged hot showers advised the patient use lukewarm water.  Advised the patient take a oral antihistamine however patient declined.  Advised to follow-up with primary care provider. Patient verbalized understanding of plan of care. At time of discharge patient is awake, alert and ambulatory.         No orders of the defined types were placed in this encounter.    Requested Prescriptions     Signed Prescriptions Disp Refills    triamcinolone (KENALOG) 0.1 % ointment 454 g 0     Sig: Apply topically 2 (two) times daily for 7 days       Discussed results and diagnosis with patient/family.  Reviewed warning signs for worsening condition, as well as, indications for follow-up with primary care physician and return to urgent care clinic.   Patient/family expressed understanding of instructions.     An After Visit Summary was provided to the patient.

## 2023-04-10 ENCOUNTER — Encounter (INDEPENDENT_AMBULATORY_CARE_PROVIDER_SITE_OTHER): Payer: Self-pay

## 2023-04-11 ENCOUNTER — Encounter (INDEPENDENT_AMBULATORY_CARE_PROVIDER_SITE_OTHER): Payer: Self-pay

## 2023-04-20 ENCOUNTER — Ambulatory Visit (INDEPENDENT_AMBULATORY_CARE_PROVIDER_SITE_OTHER): Payer: Medicare Other | Admitting: Family Medicine

## 2023-05-09 ENCOUNTER — Other Ambulatory Visit: Payer: Self-pay | Admitting: Podiatry

## 2023-05-09 ENCOUNTER — Ambulatory Visit (INDEPENDENT_AMBULATORY_CARE_PROVIDER_SITE_OTHER): Payer: Medicare Other

## 2023-05-09 ENCOUNTER — Encounter: Payer: Self-pay | Admitting: Podiatry

## 2023-05-09 ENCOUNTER — Ambulatory Visit (INDEPENDENT_AMBULATORY_CARE_PROVIDER_SITE_OTHER): Payer: Medicare Other | Admitting: Podiatry

## 2023-05-09 DIAGNOSIS — J0120 Acute ethmoidal sinusitis, unspecified: Secondary | ICD-10-CM | POA: Insufficient documentation

## 2023-05-09 DIAGNOSIS — S92334A Nondisplaced fracture of third metatarsal bone, right foot, initial encounter for closed fracture: Secondary | ICD-10-CM

## 2023-05-09 DIAGNOSIS — E559 Vitamin D deficiency, unspecified: Secondary | ICD-10-CM | POA: Insufficient documentation

## 2023-05-09 DIAGNOSIS — R0600 Dyspnea, unspecified: Secondary | ICD-10-CM | POA: Insufficient documentation

## 2023-05-09 DIAGNOSIS — J00 Acute nasopharyngitis [common cold]: Secondary | ICD-10-CM | POA: Insufficient documentation

## 2023-05-09 DIAGNOSIS — R630 Anorexia: Secondary | ICD-10-CM | POA: Insufficient documentation

## 2023-05-09 DIAGNOSIS — F419 Anxiety disorder, unspecified: Secondary | ICD-10-CM | POA: Insufficient documentation

## 2023-05-09 DIAGNOSIS — R2689 Other abnormalities of gait and mobility: Secondary | ICD-10-CM | POA: Insufficient documentation

## 2023-05-09 DIAGNOSIS — M2011 Hallux valgus (acquired), right foot: Secondary | ICD-10-CM

## 2023-05-09 DIAGNOSIS — H9311 Tinnitus, right ear: Secondary | ICD-10-CM | POA: Insufficient documentation

## 2023-05-09 DIAGNOSIS — S99911A Unspecified injury of right ankle, initial encounter: Secondary | ICD-10-CM | POA: Insufficient documentation

## 2023-05-09 DIAGNOSIS — G3184 Mild cognitive impairment, so stated: Secondary | ICD-10-CM | POA: Insufficient documentation

## 2023-05-09 DIAGNOSIS — M2010 Hallux valgus (acquired), unspecified foot: Secondary | ICD-10-CM

## 2023-05-09 DIAGNOSIS — J40 Bronchitis, not specified as acute or chronic: Secondary | ICD-10-CM | POA: Insufficient documentation

## 2023-05-09 DIAGNOSIS — J3089 Other allergic rhinitis: Secondary | ICD-10-CM | POA: Insufficient documentation

## 2023-05-09 DIAGNOSIS — J3489 Other specified disorders of nose and nasal sinuses: Secondary | ICD-10-CM | POA: Insufficient documentation

## 2023-05-09 DIAGNOSIS — R42 Dizziness and giddiness: Secondary | ICD-10-CM | POA: Insufficient documentation

## 2023-05-09 DIAGNOSIS — E7800 Pure hypercholesterolemia, unspecified: Secondary | ICD-10-CM | POA: Insufficient documentation

## 2023-05-09 DIAGNOSIS — R531 Weakness: Secondary | ICD-10-CM | POA: Insufficient documentation

## 2023-05-09 DIAGNOSIS — S92324A Nondisplaced fracture of second metatarsal bone, right foot, initial encounter for closed fracture: Secondary | ICD-10-CM

## 2023-05-09 DIAGNOSIS — M810 Age-related osteoporosis without current pathological fracture: Secondary | ICD-10-CM | POA: Insufficient documentation

## 2023-05-09 DIAGNOSIS — I10 Essential (primary) hypertension: Secondary | ICD-10-CM | POA: Insufficient documentation

## 2023-05-09 DIAGNOSIS — K219 Gastro-esophageal reflux disease without esophagitis: Secondary | ICD-10-CM | POA: Insufficient documentation

## 2023-05-09 DIAGNOSIS — H53139 Sudden visual loss, unspecified eye: Secondary | ICD-10-CM | POA: Insufficient documentation

## 2023-05-09 DIAGNOSIS — E079 Disorder of thyroid, unspecified: Secondary | ICD-10-CM | POA: Insufficient documentation

## 2023-05-09 DIAGNOSIS — Z7689 Persons encountering health services in other specified circumstances: Secondary | ICD-10-CM | POA: Insufficient documentation

## 2023-05-09 DIAGNOSIS — F32A Depression, unspecified: Secondary | ICD-10-CM | POA: Insufficient documentation

## 2023-05-09 DIAGNOSIS — G4700 Insomnia, unspecified: Secondary | ICD-10-CM | POA: Insufficient documentation

## 2023-05-09 DIAGNOSIS — J410 Simple chronic bronchitis: Secondary | ICD-10-CM | POA: Insufficient documentation

## 2023-05-09 DIAGNOSIS — H5340 Unspecified visual field defects: Secondary | ICD-10-CM | POA: Insufficient documentation

## 2023-05-09 DIAGNOSIS — Z9889 Other specified postprocedural states: Secondary | ICD-10-CM | POA: Insufficient documentation

## 2023-05-09 DIAGNOSIS — H60399 Other infective otitis externa, unspecified ear: Secondary | ICD-10-CM | POA: Insufficient documentation

## 2023-05-09 DIAGNOSIS — N3941 Urge incontinence: Secondary | ICD-10-CM | POA: Insufficient documentation

## 2023-05-09 DIAGNOSIS — E785 Hyperlipidemia, unspecified: Secondary | ICD-10-CM | POA: Insufficient documentation

## 2023-05-09 DIAGNOSIS — Z7722 Contact with and (suspected) exposure to environmental tobacco smoke (acute) (chronic): Secondary | ICD-10-CM | POA: Insufficient documentation

## 2023-05-09 DIAGNOSIS — E7439 Other disorders of intestinal carbohydrate absorption: Secondary | ICD-10-CM | POA: Insufficient documentation

## 2023-05-09 DIAGNOSIS — F0390 Unspecified dementia without behavioral disturbance: Secondary | ICD-10-CM | POA: Insufficient documentation

## 2023-05-09 DIAGNOSIS — R4182 Altered mental status, unspecified: Secondary | ICD-10-CM | POA: Insufficient documentation

## 2023-05-09 DIAGNOSIS — R062 Wheezing: Secondary | ICD-10-CM | POA: Insufficient documentation

## 2023-05-09 DIAGNOSIS — E538 Deficiency of other specified B group vitamins: Secondary | ICD-10-CM | POA: Insufficient documentation

## 2023-05-09 DIAGNOSIS — R251 Tremor, unspecified: Secondary | ICD-10-CM | POA: Insufficient documentation

## 2023-05-09 DIAGNOSIS — K5900 Constipation, unspecified: Secondary | ICD-10-CM | POA: Insufficient documentation
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
## 2023-05-11 ENCOUNTER — Encounter (INDEPENDENT_AMBULATORY_CARE_PROVIDER_SITE_OTHER): Payer: Self-pay

## 2023-05-12 ENCOUNTER — Encounter (INDEPENDENT_AMBULATORY_CARE_PROVIDER_SITE_OTHER): Payer: Self-pay

## 2023-05-15 ENCOUNTER — Encounter (INDEPENDENT_AMBULATORY_CARE_PROVIDER_SITE_OTHER): Payer: Self-pay | Admitting: Family Medicine

## 2023-05-15 ENCOUNTER — Ambulatory Visit (INDEPENDENT_AMBULATORY_CARE_PROVIDER_SITE_OTHER): Payer: Medicare Other | Admitting: Family Medicine

## 2023-05-15 VITALS — BP 117/69 | HR 65 | Temp 97.6°F | Ht 64.17 in | Wt 140.2 lb

## 2023-05-15 DIAGNOSIS — E1122 Type 2 diabetes mellitus with diabetic chronic kidney disease: Secondary | ICD-10-CM

## 2023-05-15 DIAGNOSIS — H9190 Unspecified hearing loss, unspecified ear: Secondary | ICD-10-CM

## 2023-05-15 DIAGNOSIS — N1831 Chronic kidney disease, stage 3a: Secondary | ICD-10-CM

## 2023-05-15 LAB — POCT HEMOGLOBIN A1C: POCT Hgb A1C: 7.8 % — AB (ref 3.9–5.9)

## 2023-05-15 NOTE — Progress Notes (Signed)
THave you seen any specialists/other providers since your last visit with [place]?    Yes      The patient was informed that the following HM items are still outstanding:   Health Maintenance Due   Topic Date Due    Advance Directive on File  Never done    DEPRESSION SCREENING  05/22/2018    URINE MICROALBUMIN  01/22/2022    HEMOGLOBIN A1C ANNUAL  03/15/2023    FALLS RISK ANNUAL  04/01/2023    Medicare Annual Wellness Visit  04/01/2023    INFLUENZA VACCINE  05/04/2023

## 2023-05-15 NOTE — Progress Notes (Signed)
Chief Complaint   Patient presents with    Diabetes     3 month A1c check       Depression Screening No data recorded    S:      Problem   Type 2 Diabetes Mellitus With Diabetic Chronic Kidney Disease    11/23:  will be seeing dietician.    3/24:  due for her A1c.    8/24:  now on jardiance.  Continues with her metformin and januvia combination.     Notes some hearing loss and would like checked.  Denies pain.  Remote hx of multile infections in ears as child.  Notes hard to hear when ample background noise.    Allergies[1]    Current Medications[2]    ROS    All other systems were reviewed and are negative    O:  BP 117/69 (BP Site: Right arm, Patient Position: Sitting, Cuff Size: Medium)   Pulse 65   Temp 97.6 F (36.4 C)   Ht 1.63 m (5' 4.17")   Wt 63.6 kg (140 lb 3.2 oz)   SpO2 96%   BMI 23.94 kg/m , Body mass index is 23.94 kg/m.  Vital signs reviewed  GEN:  NAD, nonobese  NEURO:  CN2-12 without focal deficit  HEENT:  normal eyes, small amount of b/l ear wax but not occlusive  PULM:  unlabored respirations  SKIN:  dry, no visible rash      A/P:     1. Type 2 diabetes mellitus with stage 3a chronic kidney disease, without long-term current use of insulin  POCT Hemoglobin A1C    Follow Up In Primary Care      2. Hearing loss, unspecified hearing loss type, unspecified laterality  Referral to ENT - EXTERNAL            Risk & Benefits of the new medication(s) were explained to the patient (and family) who verbalized understanding & agreed to the treatment plan. Patient (family) encouraged to contact me/clinical staff with any questions/concerns    Raj Janus, MD       [1]   Allergies  Allergen Reactions    Ciprofloxacin Other (See Comments)     Severe Connective Tissue (joint) pain and stiffness    Nsaids Other (See Comments)     Pt states this causes her bleeding d/t PAN   [2]   Current Outpatient Medications   Medication Sig Dispense Refill    atorvastatin (LIPITOR) 80 MG tablet Take 1 tablet (80 mg)  by mouth daily 90 tablet 3    cyanocobalamin ([employer]S VITAMIN B12) 1000 MCG tablet Take 0.5 tablets (500 mcg) by mouth daily 45 tablet 3    empagliflozin (Jardiance) 10 MG tablet Take 1 tablet (10 mg) by mouth every morning 90 tablet 3    fluticasone (FLONASE) 50 MCG/ACT nasal spray 2 sprays by Nasal route every evening 16 g 3    glucose blood (FREESTYLE LITE) test strip Check blood sugar once daily 100 strip 3    Lancets (freestyle) lancets Check blood sugar once daily 100 each 3    lidocaine (Lidoderm) 5 % Place 1 patch onto affected skin every twelve hours as needed for discomfort.  Remove each patch after 12 hours of use 30 patch 3    losartan (COZAAR) 25 MG tablet Take 1 tablet (25 mg) by mouth daily 90 tablet 3    Magnesium 400 MG Tab Take 0.5 tablets (200 mg) by mouth 2 (two) times daily slowmag  metFORMIN (GLUCOPHAGE-XR) 500 MG 24 hr tablet Take 1 tablet (500 mg) by mouth nightly 90 tablet 3    metoprolol tartrate (LOPRESSOR) 25 MG tablet Take 1 tablet (25 mg) by mouth daily (Patient taking differently: Take 2 tablets (50 mg) by mouth daily) 100 tablet 3    pantoprazole (PROTONIX) 40 MG tablet Take 1 tablet (40 mg) by mouth every evening 100 tablet 3    Probiotic Product (PROBIOTIC-10 PO) Take by mouth daily      SITagliptin-MetFORMIN HCl (Janumet XR) 50-500 MG Tablet SR 24 hr Take 1 tablet by mouth 2 (two) times daily with meals 180 tablet 3    vitamin D (CHOLECALCIFEROL) 25 MCG (1000 UT) tablet Take 1 tablet (1,000 Units) by mouth daily 100 tablet 3     No current facility-administered medications for this visit.

## 2023-05-24 ENCOUNTER — Other Ambulatory Visit (INDEPENDENT_AMBULATORY_CARE_PROVIDER_SITE_OTHER): Payer: Self-pay | Admitting: Family Medicine

## 2023-05-24 DIAGNOSIS — J301 Allergic rhinitis due to pollen: Secondary | ICD-10-CM

## 2023-05-24 DIAGNOSIS — I10 Essential (primary) hypertension: Secondary | ICD-10-CM

## 2023-05-24 DIAGNOSIS — M797 Fibromyalgia: Secondary | ICD-10-CM

## 2023-05-24 MED ORDER — METFORMIN HCL ER 500 MG PO TB24
500.0000 mg | ORAL_TABLET | Freq: Every evening | ORAL | 3 refills | Status: DC
Start: 2023-05-24 — End: 2024-01-23

## 2023-05-24 MED ORDER — FLUTICASONE PROPIONATE 50 MCG/ACT NA SUSP: 2.0000 | Freq: Every evening | NASAL | 3 refills | Status: AC

## 2023-05-24 MED ORDER — LOSARTAN POTASSIUM 25 MG PO TABS
25.0000 mg | ORAL_TABLET | Freq: Every day | ORAL | 3 refills | Status: DC
Start: 2023-05-24 — End: 2024-05-23

## 2023-05-24 MED ORDER — FREESTYLE LANCETS MISC
3 refills | Status: AC
Start: 2023-05-24 — End: ?

## 2023-05-24 MED ORDER — LIDOCAINE 5 % EX PTCH
MEDICATED_PATCH | CUTANEOUS | 3 refills | Status: AC
Start: 2023-05-24 — End: ?

## 2023-05-24 NOTE — Telephone Encounter (Signed)
Last office visit: 05/15/23    Last refill on:   Lancets 01/25/21  Flonase 06/03/21  Lidocaine 01/31/22  Metformin 05/04/22  Losartan 05/04/22    Upcoming appointment: none

## 2023-06-06 ENCOUNTER — Ambulatory Visit (INDEPENDENT_AMBULATORY_CARE_PROVIDER_SITE_OTHER): Payer: Medicare Other | Admitting: Podiatry

## 2023-06-06 ENCOUNTER — Ambulatory Visit (INDEPENDENT_AMBULATORY_CARE_PROVIDER_SITE_OTHER): Payer: Medicare Other

## 2023-06-06 ENCOUNTER — Encounter: Payer: Self-pay | Admitting: Podiatry

## 2023-06-06 DIAGNOSIS — S92324D Nondisplaced fracture of second metatarsal bone, right foot, subsequent encounter for fracture with routine healing: Secondary | ICD-10-CM

## 2023-06-06 NOTE — Progress Notes (Signed)
She presents with her husband today for follow-up of her fracture to the second and third metatarsal necks of the right foot.  She states that is still little sore as she has been wearing her cam boot.  She states that her vitamin D level is normal.  Objective: Vital signs are stable she is alert and oriented x 3.  Pulses are palpable.  There is no erythema just some mild edema and palpable osseous masses dorsal aspect of the distal second and third metatarsals.  Radiographs taken today demonstrate nice bony callus is forming around the necks of the fracture site of those metatarsals in question.  There is some lateral deviation but minimally so.  Lateral view does demonstrate some dorsal bone formation which will more than likely resolve as time goes on.  Assessment: Fracture second and third metatarsals healing.  Plan: Encouraged her to wear her cam boot for another 2 to 3 weeks and to make sure she is getting enough calcium.  Follow-up with her in 3 weeks for another set of x-rays

## 2023-06-07 ENCOUNTER — Encounter (INDEPENDENT_AMBULATORY_CARE_PROVIDER_SITE_OTHER): Payer: Self-pay | Admitting: Family Nurse Practitioner

## 2023-06-07 ENCOUNTER — Ambulatory Visit (INDEPENDENT_AMBULATORY_CARE_PROVIDER_SITE_OTHER): Payer: Medicare Other | Admitting: Family Nurse Practitioner

## 2023-06-07 VITALS — BP 132/82 | HR 69 | Temp 97.2°F | Resp 16 | Ht 64.0 in | Wt 138.0 lb

## 2023-06-07 DIAGNOSIS — J019 Acute sinusitis, unspecified: Secondary | ICD-10-CM

## 2023-06-07 DIAGNOSIS — B9689 Other specified bacterial agents as the cause of diseases classified elsewhere: Secondary | ICD-10-CM

## 2023-06-07 MED ORDER — AMOXICILLIN-POT CLAVULANATE 875-125 MG PO TABS
1.0000 | ORAL_TABLET | Freq: Two times a day (BID) | ORAL | 0 refills | Status: AC
Start: 2023-06-07 — End: 2023-06-14

## 2023-06-07 NOTE — Progress Notes (Signed)
Urgent Care Provider Note    Patient: Katelyn Caldwell   Date: 06/07/2023   MRN: [id number]       Subjective     Chief Complaint   Patient presents with    Facial Pain     X3 days facial pain after recovering from viral infection        HPI:  HPI    Katelyn Caldwell is a 72 y.o. female with c/o upper respiratory symptoms that are moderate which started 1 week ago and has been gradually worsening. Patient admits to  unilateral sinus pain and left upper dental pain . Patient denies cough, congestion, sore throat, ear ache, fever, myalgia, chills, vomiting, diarrhea, SOB, chest pain, and rash.'    Patient reports URI symptoms 1 week ago    Pertinent Past Medical, Surgical, Family and Social History were reviewed.    Current Medications[1]    Allergies[2]    Medications and Allergies reviewed.         Objective     Vitals:    06/07/23 1630   BP: 132/82   Pulse: 69   Resp: 16   Temp: 97.2 F (36.2 C)   SpO2: 99%     Body mass index is 23.69 kg/m.    Physical Exam    General: well developed, well nourished, no acute distress.     Eyes: No conjunctival injection or discharge.    Ear: TM without erythema, bulging or perforation; normal auditory canals and pinnae; no mastoid tenderness.    Nose/sinus: L frontal sinus tenderness.    Throat: no posterior oropharynx erythema, swelling, exudates or peritonsillar bulging; normal, midline uvula; no trismus or drooling.    Neck: No stridor. Normal ROM.    Lung: normal work of breathing, speaking complete sentences, no rales, wheezing or rhonchi.    Heart: Regular rhythm, no murmurs.    UCC COURSE  There were no labs reviewed with this patient during the visit.    There were no x-rays reviewed with this patient during the visit.    Current Inpatient Medications with Last Dose Taken[3]          PROCEDURES:  Procedures    MDM:    Risks and benefits of therapy discussed,  Patient/family agrees and understands.  Well appearing, no red flags,  stable for home discharge  Follow up with  primary care provider within 3 days, or follow up in clinic if symptoms worsening or not resolving as expected   ED precautions reviewed, If new, persistent or worsening signs or symptoms, call 911 or go to nearest Emergency Room  Understanding of plan of care  was verbalized  Discharge instructions discussed,and  no further questions/concerns at time of discharge.             Assessment         Katelyn Caldwell was seen today for facial pain.    Diagnoses and all orders for this visit:    Acute bacterial sinusitis  -     amoxicillin-clavulanate (AUGMENTIN) 875-125 MG per tablet; Take 1 tablet by mouth 2 (two) times daily for 7 days      The patient's physical exam is consistent with acute bacterial sinusitis.  Patient been prescribed a 1 week course of Augmentin, patient to take 1 tablet twice a day.  Recommended patient continue nasal rinse, increase Flonase to 2 sprays each nostril twice a day.  Discussed with patient that she may benefit from pseudoephedrine, 120 mg every 12 hours  advised that this is over-the-counter however behind the counter, patient states that she will inquire with primary care provider for clearance prior to use of decongestant.  Return for reevaluation as needed. Patient verbalized understanding of plan of care. At time of discharge patient is awake, alert and ambulatory.          Plan and follow-up discussed with patient. See AVS for further documentation.           [1]   Current Outpatient Medications:     atorvastatin (LIPITOR) 80 MG tablet, Take 1 tablet (80 mg) by mouth daily, Disp: 90 tablet, Rfl: 3    cyanocobalamin ([employer] VITAMIN B12) 1000 MCG tablet, Take 0.5 tablets (500 mcg) by mouth daily, Disp: 45 tablet, Rfl: 3    empagliflozin (Jardiance) 10 MG tablet, Take 1 tablet (10 mg) by mouth every morning, Disp: 90 tablet, Rfl: 3    fluticasone (FLONASE) 50 MCG/ACT nasal spray, 2 sprays by Nasal route every evening, Disp: 16 g, Rfl: 3    glucose blood (FREESTYLE LITE) test strip, Check blood  sugar once daily, Disp: 100 strip, Rfl: 3    Lancets (freestyle) lancets, Check blood sugar once daily, Disp: 100 each, Rfl: 3    lidocaine (Lidoderm) 5 %, Place 1 patch onto affected skin every twelve hours as needed for discomfort.  Remove each patch after 12 hours of use, Disp: 30 patch, Rfl: 3    losartan (COZAAR) 25 MG tablet, Take 1 tablet (25 mg) by mouth daily, Disp: 90 tablet, Rfl: 3    Magnesium 400 MG Tab, Take 0.5 tablets (200 mg) by mouth 2 (two) times daily slowmag, Disp: , Rfl:     metFORMIN (GLUCOPHAGE-XR) 500 MG 24 hr tablet, Take 1 tablet (500 mg) by mouth nightly, Disp: 90 tablet, Rfl: 3    metoprolol tartrate (LOPRESSOR) 25 MG tablet, Take 1 tablet (25 mg) by mouth daily (Patient taking differently: Take 2 tablets (50 mg) by mouth daily), Disp: 100 tablet, Rfl: 3    pantoprazole (PROTONIX) 40 MG tablet, Take 1 tablet (40 mg) by mouth every evening, Disp: 100 tablet, Rfl: 3    Probiotic Product (PROBIOTIC-10 PO), Take by mouth daily, Disp: , Rfl:     SITagliptin-MetFORMIN HCl (Janumet XR) 50-500 MG Tablet SR 24 hr, Take 1 tablet by mouth 2 (two) times daily with meals, Disp: 180 tablet, Rfl: 3    vitamin D (CHOLECALCIFEROL) 25 MCG (1000 UT) tablet, Take 1 tablet (1,000 Units) by mouth daily, Disp: 100 tablet, Rfl: 3    amoxicillin-clavulanate (AUGMENTIN) 875-125 MG per tablet, Take 1 tablet by mouth 2 (two) times daily for 7 days, Disp: 14 tablet, Rfl: 0  [2]   Allergies  Allergen Reactions    Ciprofloxacin Other (See Comments)     Severe Connective Tissue (joint) pain and stiffness    Nsaids Other (See Comments)     Pt states this causes her bleeding d/t PAN   [3]   No current facility-administered medications for this visit.

## 2023-06-11 ENCOUNTER — Encounter (INDEPENDENT_AMBULATORY_CARE_PROVIDER_SITE_OTHER): Payer: Self-pay

## 2023-06-12 ENCOUNTER — Encounter (INDEPENDENT_AMBULATORY_CARE_PROVIDER_SITE_OTHER): Payer: Self-pay

## 2023-06-27 ENCOUNTER — Ambulatory Visit: Payer: Medicare Other | Admitting: Podiatry

## 2023-06-27 DIAGNOSIS — S92324D Nondisplaced fracture of second metatarsal bone, right foot, subsequent encounter for fracture with routine healing: Secondary | ICD-10-CM

## 2023-07-04 ENCOUNTER — Ambulatory Visit (INDEPENDENT_AMBULATORY_CARE_PROVIDER_SITE_OTHER): Payer: Medicare Other | Admitting: Podiatry

## 2023-07-04 ENCOUNTER — Encounter: Payer: Self-pay | Admitting: Podiatry

## 2023-07-04 ENCOUNTER — Ambulatory Visit (INDEPENDENT_AMBULATORY_CARE_PROVIDER_SITE_OTHER): Payer: Medicare Other

## 2023-07-04 DIAGNOSIS — S92324D Nondisplaced fracture of second metatarsal bone, right foot, subsequent encounter for fracture with routine healing: Secondary | ICD-10-CM

## 2023-07-04 NOTE — Progress Notes (Signed)
She presents today for follow-up of fracture second third metatarsal necks right.  She states that is starting to feel much better.  Objective: Vital signs stable oriented x 3.  Pulses are palpable.  No pain on palpation of the second third metatarsals today that she has severe hallux valgus deformity that is rigid and position.  Considerable rotation and almost a 90 degree abduction angle.  Radiographs taken today demonstrate bone callus overlying the second third metatarsal necks appears to be consolidating very nicely.  Assessment: Well-healing fracture second and third metatarsal necks.  Hallux abductovalgus deformity with osteoarthritic change.  Plan: Referred her to Dr. Lilian Kapur for fusion.  Will allow her to get back into her regular shoe gear.

## 2023-07-11 ENCOUNTER — Encounter (INDEPENDENT_AMBULATORY_CARE_PROVIDER_SITE_OTHER): Payer: Self-pay

## 2023-07-18 ENCOUNTER — Ambulatory Visit (INDEPENDENT_AMBULATORY_CARE_PROVIDER_SITE_OTHER): Payer: Medicare Other | Admitting: Podiatry

## 2023-07-18 ENCOUNTER — Encounter: Payer: Self-pay | Admitting: Podiatry

## 2023-07-18 VITALS — Ht 59.5 in | Wt 129.0 lb

## 2023-07-18 DIAGNOSIS — M2011 Hallux valgus (acquired), right foot: Secondary | ICD-10-CM | POA: Diagnosis not present

## 2023-07-19 NOTE — Progress Notes (Signed)
  Subjective:  Patient ID: Casey Weber, female    DOB: [birth year],  MRN: [id number]  Chief Complaint  Patient presents with   Foot Pain    Patient is here for consult: HX Assessment: Well-healing fracture second and third metatarsal necks.  Hallux abductovalgus deformity with osteoarthritic change.     72 y.o. female presents with the above complaint. History confirmed with patient.  She had bunion surgery in 2021, the bunion came back fairly quickly and the toe has been rotated and is painful and limiting her ability to wear certain shoes and exercise with walking  Objective:  Physical Exam: warm, good capillary refill, no trophic changes or ulcerative lesions, normal DP and PT pulses, normal sensory exam, and recurrent hallux valgus deformity, there is a well-healed surgical scar on the medial first MTP.  Radiographs: Multiple views x-ray of the right foot: Radiographs show a recurrent hallux valgus deformity with significant frontal plane rotation of the great toe there are healed distal metatarsal fractures Assessment:   1. Acquired hallux valgus of right foot      Plan:  Patient was evaluated and treated and all questions answered.  Discussed the treatment including surgical and non surgical treatment for her recurrent painful bunion.  She has exhausted all non surgical treatment prior to this visit including shoe gear changes and padding.  She desires surgical intervention. We discussed all risks including but not limited to: pain, swelling, infection, scar, numbness which may be temporary or permanent, chronic pain, stiffness, nerve pain or damage, wound healing problems, bone healing problems including delayed or non-union and recurrence. Specifically we discussed the following procedures: Right foot first MTP joint fusion with bone graft from the heel. Informed consent was signed today. Surgery will be scheduled at a mutually agreeable date. Information regarding this will be  forwarded to our surgery scheduler. In the interim until surgery I recommended utilizing as wide of shoes as possible, take NSAIDs or tylenol as tolerated for pain, and a bunion padding shield which can be purchased online.   Surgical plan:  Procedure: -Right first MTP fusion with bone graft from heel  Location: -[facility]  Anesthesia plan: -IV sedation with regional block  Postoperative pain plan: - Tylenol 1000 mg every 6 hours, ibuprofen 600 mg every 6 hours, gabapentin 300 mg every 8 hours x5 days, oxycodone 5 mg 1-2 tabs every 6 hours only as needed  DVT prophylaxis: -None required  WB Restrictions / DME needs: -NWB in CAM boot postop which she will bring with her to surgery, utilize a rolling knee scooter and/or rollator [place]  No follow-ups on file.

## 2023-08-09 ENCOUNTER — Other Ambulatory Visit (INDEPENDENT_AMBULATORY_CARE_PROVIDER_SITE_OTHER): Payer: Self-pay | Admitting: Family Medicine

## 2023-08-09 DIAGNOSIS — E1122 Type 2 diabetes mellitus with diabetic chronic kidney disease: Secondary | ICD-10-CM

## 2023-08-09 NOTE — Telephone Encounter (Signed)
Last office visit: 05/15/23  Last refill on:  Metoprolol 07/14/22  Jardiance 08/04/22  Upcoming appointment: none

## 2023-08-10 MED ORDER — EMPAGLIFLOZIN 10 MG PO TABS
10.0000 mg | ORAL_TABLET | Freq: Every morning | ORAL | 3 refills | Status: DC
Start: 2023-08-10 — End: 2024-01-23

## 2023-08-10 MED ORDER — METOPROLOL TARTRATE 25 MG PO TABS
37.5000 mg | ORAL_TABLET | Freq: Every day | ORAL | 3 refills | Status: DC
Start: 2023-08-10 — End: 2024-07-30

## 2023-08-10 NOTE — Telephone Encounter (Signed)
RN called pt. Per pt, she is taking 1.5 tablets, 37.5mg  total daily. This change was given by her cardiologist, Dr. Aneta Mins.     Cardiology consultation report 12/17/22 in [facility], Assessment/Plan:   37.5mg  of Toprol due to hypotension on higher dose

## 2023-08-11 ENCOUNTER — Encounter (INDEPENDENT_AMBULATORY_CARE_PROVIDER_SITE_OTHER): Payer: Self-pay

## 2023-08-18 ENCOUNTER — Other Ambulatory Visit: Payer: Self-pay | Admitting: Podiatry

## 2023-08-18 DIAGNOSIS — M2011 Hallux valgus (acquired), right foot: Secondary | ICD-10-CM | POA: Diagnosis not present

## 2023-08-18 DIAGNOSIS — M89771 Major osseous defect, right ankle and foot: Secondary | ICD-10-CM | POA: Diagnosis not present

## 2023-08-18 MED ORDER — HYDROCODONE-ACETAMINOPHEN 5-325 MG PO TABS
1.0000 | ORAL_TABLET | ORAL | 0 refills | Status: AC | PRN
Start: 2023-08-18 — End: 2023-08-23

## 2023-08-18 MED ORDER — IBUPROFEN 600 MG PO TABS: 600.0000 mg | ORAL_TABLET | Freq: Four times a day (QID) | ORAL | 0 refills | Status: AC | PRN

## 2023-08-18 NOTE — Progress Notes (Signed)
11/15 R foot MPJ fusion, hardware removal and bone graft from heel

## 2023-08-20 ENCOUNTER — Other Ambulatory Visit (INDEPENDENT_AMBULATORY_CARE_PROVIDER_SITE_OTHER): Payer: Self-pay | Admitting: Family Medicine

## 2023-08-20 DIAGNOSIS — E119 Type 2 diabetes mellitus without complications: Secondary | ICD-10-CM

## 2023-08-22 MED ORDER — FREESTYLE LITE TEST VI STRP
ORAL_STRIP | 0 refills | Status: AC
Start: 2023-08-22 — End: ?

## 2023-08-22 MED ORDER — ATORVASTATIN CALCIUM 80 MG PO TABS
80.0000 mg | ORAL_TABLET | Freq: Every day | ORAL | 0 refills | Status: DC
Start: 2023-08-22 — End: 2023-10-16

## 2023-08-22 MED ORDER — JANUMET XR 50-500 MG PO TB24
1.0000 | ORAL_TABLET | Freq: Two times a day (BID) | ORAL | 0 refills | Status: DC
Start: 2023-08-22 — End: 2023-11-24

## 2023-08-22 NOTE — Telephone Encounter (Signed)
[  x] Last Related Office Visit: 05/15/23   -   Last Refill:  Janumet: 07/14/22  Glucose Test Strips 07/14/22  Atorvastatin 08/30/22   -   Upcoming Appointment: none  [x]  Last Labs: 05/15/23 A1C, 03/14/22 CMP    Due for AWV

## 2023-08-24 ENCOUNTER — Ambulatory Visit (INDEPENDENT_AMBULATORY_CARE_PROVIDER_SITE_OTHER): Payer: Medicare Other | Admitting: Podiatry

## 2023-08-24 ENCOUNTER — Encounter: Payer: Self-pay | Admitting: Podiatry

## 2023-08-24 ENCOUNTER — Ambulatory Visit (INDEPENDENT_AMBULATORY_CARE_PROVIDER_SITE_OTHER): Payer: Medicare Other

## 2023-08-24 ENCOUNTER — Telehealth: Payer: Self-pay | Admitting: Podiatry

## 2023-08-24 VITALS — Ht 59.5 in | Wt 129.0 lb

## 2023-08-24 DIAGNOSIS — M2011 Hallux valgus (acquired), right foot: Secondary | ICD-10-CM

## 2023-08-24 DIAGNOSIS — Z9889 Other specified postprocedural states: Secondary | ICD-10-CM

## 2023-08-24 MED ORDER — CEPHALEXIN 500 MG PO CAPS: 500.0000 mg | ORAL_CAPSULE | Freq: Three times a day (TID) | ORAL | 0 refills | Status: AC

## 2023-08-24 MED ORDER — HYDROCODONE-ACETAMINOPHEN 5-325 MG PO TABS: 1.0000 | ORAL_TABLET | Freq: Four times a day (QID) | ORAL | 0 refills | Status: DC | PRN

## 2023-08-24 MED ORDER — HYDROCODONE-ACETAMINOPHEN 5-325 MG PO TABS: 1.0000 | ORAL_TABLET | Freq: Four times a day (QID) | ORAL | 0 refills | Status: AC | PRN

## 2023-08-24 NOTE — Progress Notes (Signed)
  Subjective:  Patient ID: Casey Weber, female    DOB: 13-Jun-1951,  MRN: [id number]  Chief Complaint  Patient presents with   Routine Post Op    RM22: POV # 1 DOS 08/18/23 ---Fusion of right great toe joint with bone graft from heel and screw removal     72 y.o. female returns for post-op check.  She is doing well  Review of Systems: Negative except as noted in the HPI. Denies N/V/F/Ch.   Objective:  There were no vitals filed for this visit. Body mass index is 25.62 kg/m. Constitutional Well developed. Well nourished.  Vascular Foot warm and well perfused. Capillary refill normal to all digits.  Calf is soft and supple, no posterior calf or knee pain, negative Homans' sign  Neurologic Normal speech. Oriented to person, place, and time. Epicritic sensation to light touch grossly present bilaterally.  Dermatologic Skin healing well and incision is well coapted there is some peri-incisional erythema  Orthopedic: Minimal pain to palpation noted about the surgical site.  Moderate edema   Multiple view plain film radiographs: Good correction noted with hardware intact and in good position Assessment:   1. Post-operative state   2. Acquired hallux valgus of right foot    Plan:  Patient was evaluated and treated and all questions answered.  S/p foot surgery right -Progressing as expected post-operatively.  There is some peri-incisional erythema.  I sent an Rx for cephalexin as well as a refill of pain medication.  She should continue nonweightbearing in the boot with her knee scooter.  May remove dressing and shower on Monday.  Return to see me in 2 weeks for suture removal   Return in about 2 weeks (around 09/07/2023) for post op (no x-rays), suture removal.

## 2023-08-24 NOTE — Telephone Encounter (Signed)
Patient called stating the pharmacy will not fill the prescriptions sent in because it was incorrect, please call and advise  Thanks!

## 2023-09-07 ENCOUNTER — Encounter (INDEPENDENT_AMBULATORY_CARE_PROVIDER_SITE_OTHER): Payer: Self-pay | Admitting: Family Nurse Practitioner

## 2023-09-07 ENCOUNTER — Telehealth (INDEPENDENT_AMBULATORY_CARE_PROVIDER_SITE_OTHER): Payer: Medicare Other | Admitting: Family Nurse Practitioner

## 2023-09-07 ENCOUNTER — Ambulatory Visit (INDEPENDENT_AMBULATORY_CARE_PROVIDER_SITE_OTHER): Payer: Medicare Other | Admitting: Podiatry

## 2023-09-07 ENCOUNTER — Encounter: Payer: Medicare Other | Admitting: Podiatry

## 2023-09-07 ENCOUNTER — Encounter: Payer: Self-pay | Admitting: Podiatry

## 2023-09-07 VITALS — Ht 64.0 in | Wt 136.0 lb

## 2023-09-07 DIAGNOSIS — K580 Irritable bowel syndrome with diarrhea: Secondary | ICD-10-CM

## 2023-09-07 DIAGNOSIS — K862 Cyst of pancreas: Secondary | ICD-10-CM

## 2023-09-07 DIAGNOSIS — Z860100 Personal history of colon polyps, unspecified: Secondary | ICD-10-CM

## 2023-09-07 DIAGNOSIS — Z860101 Personal history of adenomatous and serrated colon polyps: Secondary | ICD-10-CM

## 2023-09-07 DIAGNOSIS — K8689 Other specified diseases of pancreas: Secondary | ICD-10-CM

## 2023-09-07 DIAGNOSIS — M2011 Hallux valgus (acquired), right foot: Secondary | ICD-10-CM
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
## 2023-09-07 NOTE — Patient Instructions (Signed)
Hello Ms. Yoakam -    It was a pleasure to meet you.    Please call [phone] to schedule MRI MRCP.    Please call our office with any questions.  [phone]    Take Care.  Pura Spice, FNP  Gastroenterology

## 2023-09-08 ENCOUNTER — Encounter (INDEPENDENT_AMBULATORY_CARE_PROVIDER_SITE_OTHER): Payer: Self-pay | Admitting: Family Nurse Practitioner

## 2023-09-09 NOTE — Progress Notes (Signed)
  Subjective:  Patient ID: Casey Weber, female    DOB: 1951-05-13,  MRN: [id number]  Chief Complaint  Patient presents with   Routine Post Op    Patient states her foot has been hurting a little bit , patient takes medication for pain when needed.     72 y.o. female returns for post-op check.  She is doing well  Review of Systems: Negative except as noted in the HPI. Denies N/V/F/Ch.   Objective:  There were no vitals filed for this visit. There is no height or weight on file to calculate BMI. Constitutional Well developed. Well nourished.  Vascular Foot warm and well perfused. Capillary refill normal to all digits.  Calf is soft and supple, no posterior calf or knee pain, negative Homans' sign  Neurologic Normal speech. Oriented to person, place, and time. Epicritic sensation to light touch grossly present bilaterally.  Dermatologic Incisions well healed and non hypertrophic  Orthopedic: Minimal pain to palpation noted about the surgical site.  Minimal  edema   Multiple view plain film radiographs: Good correction noted with hardware intact and in good position Assessment:   1. Acquired hallux valgus of right foot    Plan:  Patient was evaluated and treated and all questions answered.  S/p foot surgery right -Doing well, may begin gradual progressive WB on boot to heel mainly. Compression sleeve applied, utilize this for edema control. Regular bathing. Pain is well controlled. Expect transition to regular or surgical shoe next visit    Return in about 4 weeks (around 10/05/2023) for post op (new x-rays).

## 2023-09-10 ENCOUNTER — Encounter (INDEPENDENT_AMBULATORY_CARE_PROVIDER_SITE_OTHER): Payer: Self-pay

## 2023-09-28 ENCOUNTER — Encounter: Payer: Medicare Other | Admitting: Podiatry

## 2023-10-05 ENCOUNTER — Ambulatory Visit (INDEPENDENT_AMBULATORY_CARE_PROVIDER_SITE_OTHER): Payer: Medicare Other

## 2023-10-05 ENCOUNTER — Ambulatory Visit (INDEPENDENT_AMBULATORY_CARE_PROVIDER_SITE_OTHER): Payer: Medicare Other | Admitting: Podiatry

## 2023-10-05 ENCOUNTER — Encounter: Payer: Self-pay | Admitting: Podiatry

## 2023-10-05 DIAGNOSIS — Z9889 Other specified postprocedural states: Secondary | ICD-10-CM

## 2023-10-05 DIAGNOSIS — M2011 Hallux valgus (acquired), right foot: Secondary | ICD-10-CM

## 2023-10-06 NOTE — Progress Notes (Signed)
  Subjective:  Patient ID: Casey Weber, female    DOB: Feb 06, 1951,  MRN: [id number]  Chief Complaint  Patient presents with   Routine Post Op    POV # 3 DOS 08/18/23 ---Fusion of right great toe joint with bone graft from heel and screw removal ( Xrays)     73 y.o. female returns for post-op check.  Doing well not having any pain  Review of Systems: Negative except as noted in the HPI. Denies N/V/F/Ch.   Objective:  There were no vitals filed for this visit. There is no height or weight on file to calculate BMI. Constitutional Well developed. Well nourished.  Vascular Foot warm and well perfused. Capillary refill normal to all digits.  Calf is soft and supple, no posterior calf or knee pain, negative Homans' sign  Neurologic Normal speech. Oriented to person, place, and time. Epicritic sensation to light touch grossly present bilaterally.  Dermatologic Incisions well healed and non hypertrophic  Orthopedic: No pain or edema   Multiple view plain film radiographs: Good correction and consolidation across fusion site w/o complication  Assessment:   1. Post-operative state    Plan:  Patient was evaluated and treated and all questions answered.  S/p foot surgery right -Return to regular shoes as tolerated. Follow up in 6 weeks for new xrays. Activity as tolerated. No restrictions   Return in about 6 weeks (around 11/16/2023) for surgery follow up (final xrays right foot).

## 2023-10-09 ENCOUNTER — Ambulatory Visit: Admission: RE | Admit: 2023-10-09 | Payer: Medicare Other | Source: Ambulatory Visit

## 2023-10-11 ENCOUNTER — Encounter (INDEPENDENT_AMBULATORY_CARE_PROVIDER_SITE_OTHER): Payer: Self-pay

## 2023-10-16 ENCOUNTER — Other Ambulatory Visit: Payer: Self-pay | Admitting: Family Medicine

## 2023-10-16 ENCOUNTER — Encounter (INDEPENDENT_AMBULATORY_CARE_PROVIDER_SITE_OTHER): Payer: Self-pay | Admitting: Family Medicine

## 2023-10-16 MED ORDER — ATORVASTATIN CALCIUM 80 MG PO TABS
80.0000 mg | ORAL_TABLET | Freq: Every day | ORAL | 3 refills | Status: DC
Start: 2023-10-16 — End: 2023-10-19

## 2023-10-16 NOTE — Telephone Encounter (Signed)
 Name, strength, directions of requested refill(s):  atorvastatin  (LIPITOR) 80 MG tablet (Order [id number])     How much medication is remaining: 0    Pharmacy to send refill to or patient to pick up rx from office (mark requested pharmacy in BOLD):      [id number] - [place], [place] - [address] AT [facility] [address] [facility] [address]  [address]  [address]  [place] [address]  Phone: [phone] Fax: [phone]    [facility] - [place], [place] - [address]  [address]  [place] [address]  Phone: [phone] Fax: [phone]        Please mark X next to the preferred call back number:    Mobile: [phone] (mobile)    Home: [phone] (home)    Work: @WORKPHONE @        Medication refill request, see above. Thank you   Patient has been informed that medication refill requests should be called in up to one week prior to running out of medication.    Additional Notes:patient Rx is incorrect she always gets 90 tablets and 3 refills on medication, [place] needs to be send today please thank you requesting a call back     Next Visit: MM/DD/YYYY

## 2023-10-18 NOTE — Telephone Encounter (Signed)
 Pt is calling to request change in medication   atorvastatin (LIPITOR) 80 MG tablet    To 90 tablets.. Pt requests to have enough enough medication for one full year.     Please advise [phone]

## 2023-10-19 MED ORDER — ATORVASTATIN CALCIUM 80 MG PO TABS: 80.0000 mg | ORAL_TABLET | Freq: Every day | ORAL | 3 refills | Status: DC

## 2023-10-19 NOTE — Addendum Note (Signed)
 Addended by: Raj Janus on: 10/19/2023 10:28 AM     Modules accepted: Orders

## 2023-10-25 ENCOUNTER — Ambulatory Visit: Payer: Medicare Other

## 2023-11-11 ENCOUNTER — Encounter (INDEPENDENT_AMBULATORY_CARE_PROVIDER_SITE_OTHER): Payer: Self-pay

## 2023-11-16 ENCOUNTER — Ambulatory Visit
Admission: RE | Admit: 2023-11-16 | Discharge: 2023-11-16 | Disposition: A | Discharge status: Home / Self Care | Payer: Medicare Other | Source: Ambulatory Visit | Attending: Family Nurse Practitioner | Admitting: Family Nurse Practitioner

## 2023-11-16 ENCOUNTER — Ambulatory Visit (INDEPENDENT_AMBULATORY_CARE_PROVIDER_SITE_OTHER): Payer: Medicare Other

## 2023-11-16 ENCOUNTER — Other Ambulatory Visit: Payer: Self-pay | Admitting: Podiatry

## 2023-11-16 ENCOUNTER — Ambulatory Visit (INDEPENDENT_AMBULATORY_CARE_PROVIDER_SITE_OTHER): Payer: Medicare Other | Admitting: Podiatry

## 2023-11-16 DIAGNOSIS — K862 Cyst of pancreas: Secondary | ICD-10-CM | POA: Insufficient documentation

## 2023-11-16 DIAGNOSIS — M2011 Hallux valgus (acquired), right foot: Secondary | ICD-10-CM

## 2023-11-16 DIAGNOSIS — Z9889 Other specified postprocedural states: Secondary | ICD-10-CM

## 2023-11-16 MED ORDER — GADOTERATE MEGLUMINE 7.5 MMOL/15ML IV SOLN (CLARISCAN)
14.0000 mL | Freq: Once | INTRAVENOUS | Status: AC | PRN
Start: 2023-11-16 — End: 2023-11-16
  Administered 2023-11-16: 14 mL via INTRAVENOUS

## 2023-11-16 NOTE — Progress Notes (Signed)
  Subjective:  Patient ID: Casey Weber, female    DOB: Feb 04, 1951,  MRN: [id number]  Chief Complaint  Patient presents with   Routine Post Op    POV #3 DOS 08/18/23 ---Fusion of right great toe joint with bone graft from heel and screw removal Pt stated that she is doing great she said everything is good no pain or discomfort no new concerns      73 y.o. female returns for post-op check.  Doing well not having any pain  Review of Systems: Negative except as noted in the HPI. Denies N/V/F/Ch.   Objective:  There were no vitals filed for this visit. There is no height or weight on file to calculate BMI. Constitutional Well developed. Well nourished.  Vascular Foot warm and well perfused. Capillary refill normal to all digits.  Calf is soft and supple, no posterior calf or knee pain, negative Homans' sign  Neurologic Normal speech. Oriented to person, place, and time. Epicritic sensation to light touch grossly present bilaterally.  Dermatologic Incisions well healed and non hypertrophic  Orthopedic: No pain or edema   Multiple view plain film radiographs: Good correction and consolidation across fusion site w/o complication unchanged alignment.  Good correction compared to preop films Assessment:   1. Post-operative state    Plan:  Patient was evaluated and treated and all questions answered.  S/p foot surgery right -Continue activity as tolerated.  Complete consolidation is noted.  We discussed if there is pain over the hardware could be removed.  Follow-up with me as needed.   Return if symptoms worsen or fail to improve.

## 2023-11-24 ENCOUNTER — Other Ambulatory Visit (INDEPENDENT_AMBULATORY_CARE_PROVIDER_SITE_OTHER): Payer: Self-pay | Admitting: Family Medicine

## 2023-11-24 DIAGNOSIS — E119 Type 2 diabetes mellitus without complications: Secondary | ICD-10-CM

## 2023-11-24 MED ORDER — JANUMET XR 50-500 MG PO TB24
1.0000 | ORAL_TABLET | Freq: Two times a day (BID) | ORAL | 0 refills | Status: DC
Start: 2023-11-24 — End: 2024-01-23

## 2023-11-24 MED ORDER — CVS VITAMIN B-12 1000 MCG PO TABS
500.0000 ug | ORAL_TABLET | Freq: Every day | ORAL | 0 refills | Status: AC
Start: 2023-11-24 — End: ?

## 2023-11-24 MED ORDER — PANTOPRAZOLE SODIUM 40 MG PO TBEC
40.0000 mg | DELAYED_RELEASE_TABLET | Freq: Every evening | ORAL | 0 refills | Status: AC
Start: 2023-11-24 — End: ?

## 2023-11-29 ENCOUNTER — Ambulatory Visit (INDEPENDENT_AMBULATORY_CARE_PROVIDER_SITE_OTHER): Payer: Medicare Other | Admitting: Family Medicine

## 2023-11-30 NOTE — Progress Notes (Signed)
 Please let patient know that there are no changes in pancreatic cyst seen on MRI MRCP. Recommend repeat MRI MRCP within one year. Thank you.

## 2023-12-01 ENCOUNTER — Telehealth (INDEPENDENT_AMBULATORY_CARE_PROVIDER_SITE_OTHER): Payer: Self-pay

## 2023-12-01 NOTE — Telephone Encounter (Addendum)
 Spoke with patient and delivered these results. Patient verbalized understanding. She informed me that she has IBS and mild pancreatic insufficiency. She wants to have another appointment to discuss being take off of Creon . As she is still experiencing sxs. Informed patient I would let Olam know to see how she would advise.   ----- Message from Olam JONELLE Faster, FNP sent at 11/30/2023  3:31 PM EST -----  Please let patient know that there are no changes in pancreatic cyst seen on MRI MRCP. Recommend repeat MRI MRCP within one year. Thank you.

## 2023-12-04 NOTE — Telephone Encounter (Signed)
 Patient is schedule 3/27 at 10 am

## 2023-12-21 ENCOUNTER — Ambulatory Visit (INDEPENDENT_AMBULATORY_CARE_PROVIDER_SITE_OTHER): Payer: Medicare Other | Admitting: Family Medicine

## 2023-12-21 ENCOUNTER — Encounter (INDEPENDENT_AMBULATORY_CARE_PROVIDER_SITE_OTHER): Payer: Self-pay | Admitting: Family Medicine

## 2023-12-21 ENCOUNTER — Encounter (HOSPITAL_BASED_OUTPATIENT_CLINIC_OR_DEPARTMENT_OTHER): Payer: Self-pay

## 2023-12-21 VITALS — BP 98/61 | HR 72 | Temp 98.0°F | Ht 62.99 in | Wt 137.8 lb

## 2023-12-21 DIAGNOSIS — M7661 Achilles tendinitis, right leg: Secondary | ICD-10-CM

## 2023-12-21 DIAGNOSIS — K862 Cyst of pancreas: Secondary | ICD-10-CM

## 2023-12-21 DIAGNOSIS — M300 Polyarteritis nodosa: Secondary | ICD-10-CM

## 2023-12-21 DIAGNOSIS — E1122 Type 2 diabetes mellitus with diabetic chronic kidney disease: Secondary | ICD-10-CM

## 2023-12-21 DIAGNOSIS — N1831 Chronic kidney disease, stage 3a: Secondary | ICD-10-CM

## 2023-12-21 LAB — POCT HEMOGLOBIN A1C: POCT Hgb A1C: 7.9 % — AB (ref 3.9–5.9)

## 2023-12-21 NOTE — Progress Notes (Signed)
 Have you seen any specialists/other providers since your last visit with [place]?    Yes. Nephrology, radiologist, audiologist.      The patient was informed that the following HM items are still outstanding:   Health Maintenance Due   Topic Date Due    Advance Directive on File  Never done    URINE MICROALBUMIN  01/22/2022    FALLS RISK ANNUAL  04/01/2023    Medicare Annual Wellness Visit  04/01/2023

## 2023-12-21 NOTE — Progress Notes (Signed)
 [facility] [place]           Subjective     History of Present Illness  The patient, with a history of diabetes, presents with a foot problem. She reports an injury to her peroneal tendon following a breast cancer surgery, which has been exacerbated by carrying her grandchild up and down stairs. The patient describes the pain as originating from the area behind the lateral malleolus and extending to the heel. The pain is worse when walking up steps or going up on her toes.    In addition to the foot problem, the patient reports difficulty controlling her blood sugar levels, with recent readings slightly higher than previous ones. She expresses a desire to lower her blood sugar levels to less than seven. The patient attributes the difficulty in controlling her blood sugar to decreased activity due to the foot problem and dietary restrictions due to irritable bowel syndrome (IBS) and pancreatic insufficiency.    The patient also reports anxiety, particularly when driving at high speeds on the highway. She expresses a desire to address this issue once her other health concerns are under control. The patient also mentions a history of breast cancer and a recent MRI showing stable cysts.    Review of Systems    Objective   BP 98/61 (BP Site: Right arm, Patient Position: Sitting, Cuff Size: Medium)   Pulse 72   Temp 98 F (36.7 C) (Temporal)   Ht 1.6 m (5' 2.99")   Wt 62.5 kg (137 lb 12.8 oz)   SpO2 98%   BMI 24.42 kg/m   Physical Exam  Physical Exam  MUSCULOSKELETAL: No tenderness on twisting of the right foot. Pain anterior to the right Achilles tendon. No pain in the arch of the right foot. Pain in the right heel.     Results  RADIOLOGY  MRI: Cysts are stable      Assessment/Plan     Assessment & Plan  Achilles Bursitis and Tendonitis  Right foot pain due to inflammation of the Achilles bursa and tendon, likely related to previous injury and recent activities. Prefers conservative treatment.  -  Refer to podiatry for evaluation and management, consider boot or steroid injection.  - Refer to physical therapy for exercises and management.  - Advise ice and rest to reduce inflammation.  - Recommend shoes with slight heel elevation.  - Suggest heel cushion for support.  - Instruct on self-stretching exercises for Achilles tendon.    Pancreatic Insufficiency and IBS  Gastrointestinal symptoms, including diarrhea, possibly linked to pancreatic issues. Some relief with probiotic.    Type 2 Diabetes Mellitus  Blood sugar levels slightly elevated, above target. Aware of dietary challenges due to IBS and pancreatic insufficiency.    Hypertension  Blood pressure generally low, occasional dizziness. On metoprolol and losartan for renal protection.    Anxiety  Anxiety related to driving, exacerbated by gastrointestinal symptoms. Considering Zoloft after addressing physical health issues.  - Reassess anxiety management in three months, consider Zoloft if needed.    Goals of Care  No advanced directive in place, encouraged to establish one.  - Encourage discussion and establishment of an advanced directive.    Recording duration: 18 minutes    Time spent in discussion: 35 minutes      Verbal consent obtained to record this visit.

## 2023-12-28 ENCOUNTER — Telehealth (INDEPENDENT_AMBULATORY_CARE_PROVIDER_SITE_OTHER): Payer: Self-pay | Admitting: Family Nurse Practitioner

## 2023-12-28 ENCOUNTER — Telehealth (INDEPENDENT_AMBULATORY_CARE_PROVIDER_SITE_OTHER): Admitting: Family Nurse Practitioner

## 2023-12-28 DIAGNOSIS — Z860101 Personal history of adenomatous and serrated colon polyps: Secondary | ICD-10-CM

## 2023-12-28 DIAGNOSIS — Z860100 Personal history of colon polyps, unspecified: Secondary | ICD-10-CM

## 2023-12-28 DIAGNOSIS — K580 Irritable bowel syndrome with diarrhea: Secondary | ICD-10-CM

## 2023-12-28 DIAGNOSIS — K8689 Other specified diseases of pancreas: Secondary | ICD-10-CM

## 2023-12-28 NOTE — Progress Notes (Signed)
 Gastroenterology    Olam Pandy [place], FNP-BC  [address], [address]  [place], [place].  [address]  Phone: [phone]  Fax: [phone]  Telehealth:  The Patient has given verbal consent for delivery of health care via telehealth.   The patient is located at Home in [place]   The encounter provider is located at their Home in [place]   Epic Video Client was utilized for Real Time/Synchronous Telehealth.       Subjective     History of Present Illness  Katelyn Caldwell is a 73 year old female with pancreatic cysts who presents with severe diarrhea and urgency.    She experiences severe diarrhea with urgency occurring one to three times a week, significantly impacting her ability to leave the house. She has tried antidiarrheals, which provide temporary relief, but is concerned about her use due to her stage three kidney disease and diabetes.    She has been told she might have IBS, although it was never formally diagnosed.  She experiences periodic constipation, gas, and diarrhea, but no cramping. She has tried dietary modifications, including a food diary and the FODMAP diet, but has not been able to pinpoint specific triggers.    She is following up on an MRI conducted in February, which showed no changes in the pancreatic cysts. She was previously diagnosed with pancreatic insufficiency and treated with Creon  for over a year, but the medication was stopped after it was deemed ineffective. Subsequent testing indicated she did not have pancreatic insufficiency.    Denies NSAIDs.  Denies ETOH, cigarettes, marijuana, and illcit drug use.  Denies cardiac history. Not on anticoagulation.  Denies sleep apnea.  Denies fever, chills, and SOB.    Review of Systems   All other systems reviewed and are negative.      Objective   There were no vitals taken for this visit.  Physical Exam  Physical Exam       Results  GI Procedures:  August 15, 2022  EUS  Normal esophagus.  Normal examined duodenum.  Chronic  gastritis.  3 cystic lesions seen in the pancreatic head.  Fine-needle aspiration for fluid performed.  No significant pathology in the entire pancreas.  Pathology:   Stomach, random, biopsy:       - Normal gastric mucosa       - Negative for H. pylori organisms on H&E      March 30, 2022  EUS  Cystic lesion seen in the pancreatic head.  No significant pathology in the entire pancreas.     April 07, 2021  EGD  Normal oropharynx.  Normal esophagus.  Medium size hiatal hernia.  Gastritis.  Normal duodenal bulb and second portion of the duodenum.  Colonoscopy  Diverticulosis in the sigmoid colon.  Two 4 to 5 mm sigmoid colon polyps.  Nonbleeding external and internal hemorrhoids.  Pathology:  A.  GASTRIC ANTRUM AND BODY BIOPSY:   GASTRIC ANTRUM AND BODY MUCOSA SHOWING FEATURES OF PROTON PUMP   INHIBITOR MEDICATION EFFECT, NEGATIVE FOR GASTRITIS, HELICOBACTER,   INTESTINAL METAPLASIA OR DYSPLASIA     B.  COLON RANDOM BIOPSY:       1. NORMAL COLON, NEGATIVE FOR INFLAMMATORY BOWEL DISEASE OR   MICROSCOPIC COLITIS       2. MISPLACED FRAGMENTS OF NORMAL ESOPHAGEAL SQUAMOUS MUCOSA   PRESENT, SEE COMMENT     C.  DESCENDING COLON POLYP:   TUBULAR ADENOMA, NEGATIVE FOR HIGH-GRADE DYSPLASIA     D.  SIGMOID COLON POLYP:  TUBULAR ADENOMA, NEGATIVE FOR HIGH-GRADE DYSPLASIA      Lab Work & Imaging:  March 14, 2022  H/H & MCV WNL.  LFT WNL.    November 16, 2023  MRI MRCP  HEPATOBILIARY: There is a stable hemangioma in the left lobe of the liver.      MRCP: There is no intrahepatic or extrahepatic biliary ductal dilatation or  filling defect. The pancreatic duct is normal in diameter.      Gallbladder: The gallbladder is unremarkable in appearance.        Pancreas: A 2.5 x 1.4 cm and previously 2.4 x 1.7 cm relatively stable  multiloculated cystic lesion (or cluster of smaller cysts) is again in the  head and uncinate process of the pancreas. This appears to communicate with  the pancreatic duct. No large solid-appearing  pancreatic  lesions can be identified. There are no extrahepatic  collections. No peripancreatic inflammatory changes can be identified. A  smaller stable 4 mm cyst is also seen in the pancreatic neck     Spleen: Unremarkable.      Adrenal glands: Unremarkable.      Kidneys: No significant abnormality.      Lymph Nodes: No  adenopathy     Abdominal vasculature: No abdominal aortic aneurysm or vascular thromboses     Hollow viscus: Visualized bowel is normal in caliber.     Peritoneal cavity: No ascites is present.      Musculoskeletal: No bony destructive lesion        IMPRESSION:      Persistent 2.5 cm cystic lesion of the pancreatic head with additional  stable smaller 4 mm pancreatic neck cystic lesion. Consider surveillance  magnetic resonance imaging in one to 2 years confirm long-term stability.     July 27, 2022  MRI MRCP  The biliary tree was studied in multiple planes with thin section,  breath-hold single shot fast spin-echo T2 weighted technique.  Thick-slab  techniques were also utilized.  The individual partitions were displayed  and also reconstructed in multiple obliquities.     FINDINGS:     HEPATOBILIARY: A circumscribed 3.0 x 2.6 cm elevated T2 signal lesion of  hepatic segment 2 is again identified. Following the administration of  contrast there is peripheral nodular enhancement with fills in on more  delayed postcontrast phases consistent with a hemangioma.     MRCP: There is no intrahepatic or extrahepatic biliary ductal dilatation.  No ductal mural irregularity, or areas of stricturing are identified. The  common bile duct measures 5  mm in diameter and contains no filling  defects. The distal common bile duct tapers smoothly to the level of the  ampulla. The pancreatic duct is normal in diameter.      Gallbladder: The gallbladder is unremarkable in appearance.        Pancreas: A 2.4 x 1.7 cm multiloculated cystic lesion is identified of the  head and uncinate process of the pancreas inseparable  from  the main pancreatic duct. The pancreas is otherwise unremarkable in  appearance. The pancreatic duct is nondilated. No large solid-appearing  pancreatic lesions can be identified. There are no extrahepatic  collections. No peripancreatic inflammatory changes can be identified.     Spleen: Unremarkable.      Adrenal glands: Unremarkable.      Kidneys: No significant abnormality. No hydronephrosis. No perinephric  stranding.      Lymph Nodes: No abdominal or pelvic lymphadenopathy.     Abdominal vasculature: Mild atherosclerotic calcification  and tortuosity is  noted of the abdominal aorta and its branches.      Hollow viscus: The large and small bowel are normal in caliber.     Peritoneal cavity: No ascites is present.      Musculoskeletal: The osseous structures are intact.     IMPRESSION:      1. Mass within the left hepatic lobe consistent with a hemangioma.  2. Since 05/03/2022, stable small cystic lesion of the pancreatic head  which may represent a side branch IPMN and or serous cystic neoplasm.  Clinical and biochemical assessment is recommended as well as 6-12 month  follow-up and/or endoscopic sonogram.         Assessment/Plan     Assessment & Plan  Irritable Bowel Syndrome (IBS)  Severe diarrhea with urgency impacts quality of life. Previous tests ruled out other conditions. Dietary changes ineffective. Antidiarrheals provide temporary relief but are not ideal due to chronic kidney disease. Cholestyramine proposed for management.  - Send FODMAP diet information.  - Discuss cholestyramine for diarrhea management.  - Consider reintroducing Creon  if previously effective.    Stage 3 Chronic Kidney Disease  Complicates IBS management, particularly antidiarrheal use.    Diabetes Mellitus  Limits dietary options and complicates IBS management.    Pancreatic Cysts  Previously identified as precancerous, no changes on recent MRI.   - Repeat MRI in one year.    Verbal consent obtained to record this visit.

## 2023-12-28 NOTE — Patient Instructions (Signed)
 Helo Ms. Metheny -    We will call to schedule you for a follow up.  Cholestyramine is the medication we discussed that is a binder that can be effective in some patients with diarrhea.  Please repeat MRI MRCP in February 2026.    Please call our office with any questions.  [phone]    Take Care.  Olam Faster, FNP  Gastroenterology

## 2023-12-28 NOTE — Telephone Encounter (Signed)
 Please schedule patient for follow up with Dr. Jorja Newport. No urgency and no overbooking. Thank you.

## 2024-01-01 ENCOUNTER — Encounter (INDEPENDENT_AMBULATORY_CARE_PROVIDER_SITE_OTHER): Payer: Self-pay

## 2024-01-03 ENCOUNTER — Other Ambulatory Visit (INDEPENDENT_AMBULATORY_CARE_PROVIDER_SITE_OTHER): Payer: Self-pay | Admitting: Registered Nurse

## 2024-01-07 ENCOUNTER — Ambulatory Visit (INDEPENDENT_AMBULATORY_CARE_PROVIDER_SITE_OTHER): Payer: Self-pay | Admitting: Registered Nurse

## 2024-01-09 ENCOUNTER — Ambulatory Visit (INDEPENDENT_AMBULATORY_CARE_PROVIDER_SITE_OTHER): Admitting: Medical

## 2024-01-09 ENCOUNTER — Encounter (INDEPENDENT_AMBULATORY_CARE_PROVIDER_SITE_OTHER): Payer: Self-pay | Admitting: Medical

## 2024-01-09 ENCOUNTER — Ambulatory Visit (INDEPENDENT_AMBULATORY_CARE_PROVIDER_SITE_OTHER)

## 2024-01-09 VITALS — BP 112/74 | HR 69

## 2024-01-09 DIAGNOSIS — M7751 Other enthesopathy of right foot: Secondary | ICD-10-CM

## 2024-01-09 DIAGNOSIS — M722 Plantar fascial fibromatosis: Secondary | ICD-10-CM

## 2024-01-09 DIAGNOSIS — M7671 Peroneal tendinitis, right leg: Secondary | ICD-10-CM

## 2024-01-09 NOTE — Progress Notes (Addendum)
 Subjective:       Patient ID: Katelyn Caldwell is a 73 y.o. female.    History of Present Illness  The patient is a 73 yo F with a history of peroneal tendon injury in 2020, presents with worsening foot pain. The pain is localized to the heel and occasionally radiates to the side of the foot. The pain is exacerbated by carrying her granddaughter and navigating stairs. She has been managing the pain by elevating the foot, light massage, and avoiding NSAIDs due to a contraindication. She has been wearing a shoe insert, which she believed was helping, but the pain persists.    The patient also has a history of injury at the lateral side of her foot, which was injured while undergoing breast cancer surgery and her foot was hit into a wall. She has not had problems with it until recently when she started carrying her granddaughter. She also has a history of a broken toe and arthritis in the toes.         Objective:     Physical Exam  MUSCULOSKELETAL: Negative anterior drawer. No tenderness to palpation of the bones at the forefoot. No pain at lateral and medial malleolus. No ligaments are tender to palpation. Tenderness at chronic old fracture off cuboid bone. Exquisite tenderness to palpation at the peroneal tendon and plantar fascia.     XR:   3 views of the R foot obtained. Os peroneum noted. Cortical disruption at the base of the 5th metatarsal. Moderate-severe osteoarthritis evidenced at the joint spaces of her toes.         Assessment/Plan:      Assessment & Plan  Chronic peroneal tendonitis and plantar fasciitis  Chronic peroneal tendonitis and plantar fasciitis exacerbated by weight-bearing activities.   - Recommend orthotic shoes with cushioning, such as Hoka.  - Provide list of recommended shoe brands and types.  - Advise against high heels and carrying heavy weights.  - Prescribe stretching exercises for tendon and fascia.  - Suggest over-the-counter creams like Penetrex, Arnicare gel, or Biofreeze with  nephrologist's approval.  - Refer to physical therapy for dry needling and other therapies.  - Consider cortisone injection if other treatments ineffective after a couple of months.    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

## 2024-01-17 ENCOUNTER — Encounter (INDEPENDENT_AMBULATORY_CARE_PROVIDER_SITE_OTHER): Payer: Self-pay | Admitting: Gastroenterology

## 2024-01-17 ENCOUNTER — Ambulatory Visit (INDEPENDENT_AMBULATORY_CARE_PROVIDER_SITE_OTHER): Admitting: Gastroenterology

## 2024-01-17 VITALS — BP 118/70 | HR 64 | Resp 18 | Wt 139.2 lb

## 2024-01-17 NOTE — Patient Instructions (Signed)
 Please follow up with your PCP regarding metformin  and other diabetic medications.    Once they are changed / eliminated if you symptoms continue please follow up in the office.

## 2024-01-17 NOTE — Progress Notes (Unsigned)
 Gastroenterology    Rosevelt Constable MD, MS.  [address] [place], [place] [address]  Appointments: [phone]         Reason for Consultation:       Assessment and Plan:       History:   Katelyn Caldwell is a 73 y.o. 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     She experiences severe diarrhea with urgency occurring one to three times a week, significantly impacting her ability to leave the house. She has tried antidiarrheals, which provide temporary relief, but is concerned about her use due to her stage three kidney disease and diabetes.     She has been told she might have IBS, although it was never formally diagnosed.  She experiences periodic constipation, gas, and diarrhea, but no cramping. She has tried dietary modifications, including a food diary and the FODMAP diet, but has not been able to pinpoint specific triggers.     She is following up on an MRI conducted in February, which showed no changes in the pancreatic cysts. She was previously diagnosed with pancreatic insufficiency and treated with Creon  for over a year, but the medication was stopped after it was deemed ineffective. Subsequent testing indicated she did not have pancreatic insufficiency.     Denies NSAIDs.  Denies ETOH, cigarettes, marijuana, and illcit drug use.  Denies cardiac history. Not on anticoagulation.  Denies sleep apnea.  Denies fever, chills, and SOB.     Review of Systems   All other systems reviewed and are negative.    denies any fevers, chills, recent weight change, nausea, vomiting, heartburn, dysphagia, odynophagia, change in appetite, diarrhea, constipation, hematochezia, melena or jaundice.       minutes was spent on this encounter.  This includes the time spent reviewing pathology and imaging reports prior to the encounter as well as time spent discussing the care  with other physicians and also face to face time with the patient.       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  Stopped vitamin d  and calcium  supplements being taken at that time about 1 month ago.  No hx renal stones, no psychosis, no abdominal pain.  Has occurred prior with normal ionized calcium  on review.   1/23:  Last evaluation including vitamin D  normal about 1 month ago.  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  Had palpitations which have reduced with initiation of toprol .  Pt notes at [facility] for chest pain  and URI and underwent EKG. 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  She feels fine. Denies chest pain.  Sx resolved sponta    Psoriasis     scalp    Renal insufficiency     stage 3 2/2 pan    Sleep apnea     CPAP nightly.    Tricuspid regurgitation     trace    UTI (urinary tract infection) 08/09/2022    pt started on Bactrim  PO for 10 days       Past Surgical History:   Past Surgical History[1]    Family History:   Family History[2]    Social History:   Social History[3]     Allergies:   Allergies[4]    Medications:   Current Medications[5]    Review of Systems:   Constitutional:No fever,chills,weight loss or gain.  Integument:No skin rashes or lesions.  Eyes:No changes in vision  ENMT:No changes in hearing,nasal discharge,sore throat,oral lesions.  Respiratory:No shortness of breath or cough.  Cardiovascular:No chest pain or palpitations.  Genitourinary:No nocturia,urinary frequency, or dysuria.  Gastrointestinal:See HPI.  Musculoskeletal:No back or joint pain.  Neurologic:No migraine headaches,numbness, or tingling.  Endocrine:No polydipsia,cold or heat intolerance.  Hematologic:No bleeding or bruising.  Psychiatric:No depression or anxiety.     Physical Exam:   There were no vitals filed for this visit.     General appearance - Well developed and well nourished. Normal gait.  HEENT- Normocephalic. Eomi. Sclera anicteric. Normal hearing. Nares normal.  Oropharynx normal.  Neck - Supple.  No thyromegaly.  No cervical lymphadenopathy.  Chest - clear to percussion and auscultation.  No wheeze, rhonchi, or rales.  Heart - regular rate and rhythm without murmurs, gallops, or rubs.  Abdomen - normal bowel sounds.  No focal tenderness to palpation. No hepatosplenomegaly.  No masses. No ascites.  Rectal - deferred until time of colonoscopy.  Musculoskeletal - normal range of motion of arms and legs.  Extremities - no clubbing, cyanosis, or edema.  Skin - no rashes or  lesions.  Neurologic - Alert and oriented to person, place and time.  No focal motor or sensory deficits.  No asterixis.  Recent and remote memory normal.            [1]   Past Surgical  History:  Procedure Laterality Date    ABDOMINAL SURGERY  1983    C- section    ASSESSMENT, MARGIN, INTRAOPERATIVE, RADIOFREQUENCY SPECTROSCOPY Left 12/25/2018    Procedure: RADIOFREQUENCY SPECTROSCOPY INTRAOPERATIVE MARGIN ASSESSMENT;  Surgeon: Char Common, MD;  Location: [facility];  Service: General;  Laterality: Left;    BIOPSY, BREAST, TUMOR EXCISION, ULTRASOUND NEEDLE LOCALIZATION Left 12/25/2018    Procedure: BIOPSY, BREAST, TUMOR EXCISION, ULTRASOUND NEEDLE LOCALIZATION;  Surgeon: Char Common, MD;  Location: [facility];  Service: General;  Laterality: Left;    BLADDER SUSPENSION  cystoscopy    benign    BREAST, LUMPECTOMY Left 12/25/2018    Procedure: BREAST, LUMPECTOMY;  Surgeon: Char Common, MD;  Location: [facility];  Service: General;  Laterality: Left;  LEFT BREAST LUMPECTOMY WITH ULTRASOUND GUIDED WIRE PLACEMENT IN OR BY MD    BREAST, LUMPECTOMY Left 01/15/2019    Procedure: BREAST, LUMPECTOMY;  Surgeon: Char Common, MD;  Location: [facility];  Service: General;  Laterality: Left;  LEFT BREAST LUMPECTOMY ( RE-EXCISION)    CATARACT EXTRACTION Bilateral     CESAREAN SECTION      1983    CYSTOSCOPY, BLADDER BIOPSY      EGD, COLONOSCOPY N/A 04/07/2021    Procedure: EGD, COLONOSCOPY;  Surgeon: Claudio Culver, MD;  Location: [facility];  Service: Gastroenterology;  Laterality: N/A;  EGD, COLONOSCOPY  Asst=N    ESOPHAGOGASTRODUODENOSCOPY (EGD), ESOPHAGEAL ULTRASOUND (EUS) LIMITED TO THE ESOPHAGUS, STOMACH OR DUODENUM N/A 03/30/2022    Procedure: EGD, EUS (ESOPHAGEAL ULTRASOUND);  Surgeon: Loney Rivet, MD;  Location: [facility];  Service: Gastroenterology;  Laterality: N/A;    ESOPHAGOGASTRODUODENOSCOPY (EGD), ESOPHAGEAL ULTRASOUND (EUS), FINE N) N/A 08/15/2022     Procedure: ESOPHAGOGASTRODUODENOSCOPY (EGD), ESOPHAGEAL ULTRASOUND (EUS), FINE NEEDLE ASPIRATION (FNA);  Surgeon: Rhoda Centers, MD;  Location: [facility];  Service: Gastroenterology;  Laterality: N/A;    EXCISION BIOPSY WITH NEEDLE LOCALIZATION  2012    left breast, benign    FRACTURE SURGERY  February 2012    proximal humerus, left    LIFT, ARM (MEDICAL)  2012    Reconstractiv surgery from [facility] accident.    RENAL BIOPSY  2003    SINUS SURGERY  06/2017    SKIN BIOPSY      unkown   [2]   Family History  Problem Relation Name Age of Onset    Diabetes Mother Mom     Thyroid  disease Mother Mom     Heart disease Mother Mom     Hyperlipidemia Father Dad    [3]   Social History  Socioeconomic History    Marital status: Married   Tobacco Use    Smoking status: Never     Passive exposure: Never    Smokeless tobacco: Never   Vaping Use    Vaping status: Never Used   Substance and Sexual Activity    Alcohol use: Not Currently     Comment: 1-2 glasses of wine a week    Drug use: No    Sexual activity: Not Currently     Partners: Male     Birth control/protection: Post-menopausal     Social Drivers of Health     Financial Resource Strain: Low Risk  (05/05/2023)    Overall Financial Resource Strain (CARDIA)     Difficulty of Paying Living Expenses: Not hard at all   Food Insecurity: No Food Insecurity (05/05/2023)    Hunger Vital Sign  Worried About Programme researcher, broadcasting/film/video in the Last Year: Never true     Ran Out of Food in the Last Year: Never true   Transportation Needs: No Transportation Needs (05/05/2023)    PRAPARE - Therapist, art (Medical): No     Lack of Transportation (Non-Medical): No   Physical Activity: Sufficiently Active (05/05/2023)    Exercise Vital Sign     Days of Exercise per Week: 5 days     Minutes of Exercise per Session: 30 min   Stress: No Stress Concern Present (05/05/2023)    [employer] of Occupational Health - Occupational Stress Questionnaire     Feeling of Stress : Only a  little   Social Connections: Moderately Integrated (05/05/2023)    Social Connection and Isolation Panel [NHANES]     Frequency of Communication with Friends and Family: More than three times a week     Frequency of Social Gatherings with Friends and Family: Three times a week     Attends Religious Services: Never     Active Member of Clubs or Organizations: Yes     Attends Banker Meetings: More than 4 times per year     Marital Status: Married   Catering manager Violence: Not At Risk (05/05/2023)    Humiliation, Afraid, Rape, and Kick questionnaire     Fear of Current or Ex-Partner: No     Emotionally Abused: No     Physically Abused: No     Sexually Abused: No   Housing Stability: Low Risk  (05/05/2023)    Housing Stability Vital Sign     Unable to Pay for Housing in the Last Year: No     Number of Times Moved in the Last Year: 0     Homeless in the Last Year: No   [4]   Allergies  Allergen Reactions    Ciprofloxacin  Other (See Comments)     Severe Connective Tissue (joint) pain and stiffness    Nsaids Other (See Comments)     Pt states this causes her bleeding d/t PAN   [5]   Current Outpatient Medications:     atorvastatin  (LIPITOR) 80 MG tablet, Take 1 tablet (80 mg) by mouth daily, Disp: 90 tablet, Rfl: 3    cyanocobalamin  ([employer] VITAMIN B12) 1000 MCG tablet, Take 0.5 tablets (500 mcg) by mouth daily, Disp: 45 tablet, Rfl: 0    empagliflozin  (Jardiance ) 10 MG tablet, Take 1 tablet (10 mg) by mouth every morning, Disp: 90 tablet, Rfl: 3    fluticasone  (FLONASE ) 50 MCG/ACT nasal spray, 2 sprays by Nasal route every evening, Disp: 16 g, Rfl: 3    glucose blood (FREESTYLE LITE) test strip, Check blood sugar once daily, Disp: 100 strip, Rfl: 0    Lancets (freestyle) lancets, Check blood sugar once daily, Disp: 100 each, Rfl: 3    lidocaine  (Lidoderm ) 5 %, Place 1 patch onto affected skin every twelve hours as needed for discomfort.  Remove each patch after 12 hours of use, Disp: 30 patch, Rfl: 3    losartan   (COZAAR ) 25 MG tablet, Take 1 tablet (25 mg) by mouth daily, Disp: 90 tablet, Rfl: 3    Magnesium 400 MG Tab, Take 0.5 tablets (200 mg) by mouth 2 (two) times daily slowmag, Disp: , Rfl:     metFORMIN  (GLUCOPHAGE -XR) 500 MG 24 hr tablet, Take 1 tablet (500 mg) by mouth nightly, Disp: 90 tablet, Rfl: 3    metoprolol  tartrate (  LOPRESSOR ) 25 MG tablet, Take 1.5 tablets (37.5 mg) by mouth daily, Disp: 135 tablet, Rfl: 3    pantoprazole  (PROTONIX ) 40 MG tablet, Take 1 tablet (40 mg) by mouth every evening, Disp: 100 tablet, Rfl: 0    Probiotic Product (PROBIOTIC-10 PO), Take by mouth daily, Disp: , Rfl:     SITagliptin -MetFORMIN  HCl (Janumet  XR) 50-500 MG Tablet SR 24 hr, Take 1 tablet by mouth 2 (two) times daily with meals, Disp: 180 tablet, Rfl: 0    vitamin D  (CHOLECALCIFEROL ) 25 MCG (1000 UT) tablet, Take 1 tablet (1,000 Units) by mouth daily, Disp: 100 tablet, Rfl: 3

## 2024-01-23 ENCOUNTER — Inpatient Hospital Stay: Attending: Medical

## 2024-01-23 ENCOUNTER — Encounter (INDEPENDENT_AMBULATORY_CARE_PROVIDER_SITE_OTHER): Payer: Self-pay | Admitting: Family Medicine

## 2024-01-23 ENCOUNTER — Telehealth (INDEPENDENT_AMBULATORY_CARE_PROVIDER_SITE_OTHER): Admitting: Family Medicine

## 2024-01-23 VITALS — BP 141/78 | HR 67

## 2024-01-23 DIAGNOSIS — E1122 Type 2 diabetes mellitus with diabetic chronic kidney disease: Secondary | ICD-10-CM

## 2024-01-23 DIAGNOSIS — M722 Plantar fascial fibromatosis: Secondary | ICD-10-CM | POA: Insufficient documentation

## 2024-01-23 DIAGNOSIS — M7671 Peroneal tendinitis, right leg: Secondary | ICD-10-CM | POA: Insufficient documentation

## 2024-01-23 DIAGNOSIS — M25571 Pain in right ankle and joints of right foot: Secondary | ICD-10-CM | POA: Insufficient documentation

## 2024-01-23 DIAGNOSIS — E119 Type 2 diabetes mellitus without complications: Secondary | ICD-10-CM

## 2024-01-23 DIAGNOSIS — K8681 Exocrine pancreatic insufficiency: Secondary | ICD-10-CM

## 2024-01-23 DIAGNOSIS — N1831 Chronic kidney disease, stage 3a: Secondary | ICD-10-CM

## 2024-01-23 MED ORDER — SITAGLIPTIN PHOSPHATE 50 MG PO TABS
50.0000 mg | ORAL_TABLET | Freq: Two times a day (BID) | ORAL | 0 refills | Status: AC
Start: 2024-01-23 — End: ?

## 2024-01-23 MED ORDER — EMPAGLIFLOZIN 25 MG PO TABS
25.0000 mg | ORAL_TABLET | Freq: Every morning | ORAL | 0 refills | Status: AC
Start: 2024-01-23 — End: ?

## 2024-01-23 NOTE — Progress Notes (Signed)
 [facility]       Telehealth:  The Patient has given verbal consent for delivery of health care via telehealth.   The patient is located at Home in [place]   The encounter provider is located at their Medical Office in [place]   Epic Video Client was utilized for Real Time/Synchronous Telehealth.   The time spent in medical discussion during this visit was 22 minutes.        Subjective     Chief Complaint   Patient presents with    Diabetes    Irritable Bowel Syndrome     History of Present Illness  The patient, with a history of diabetes and IBS, presents with worsening gastrointestinal symptoms which she believes have been exacerbated by her diabetes medications. The patient reports that her IBS was manageable until she was diagnosed with diabetes and started on metformin , Janumet , and Jardiance . The patient's symptoms have progressively worsened with the addition of each new medication. The patient also reports a recent foot injury which has limited her physical activity and potentially impacted her diabetes management. The patient has consulted with a gastroenterologist who suggested that the diabetes medications could be causing the gastrointestinal issues. The patient is seeking advice on adjusting her medication to alleviate her gastrointestinal symptoms.  Review of Systems    Objective   There were no vitals taken for this visit.  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 Exocrine pancreatic insufficiency        2. Type 2 diabetes mellitus with stage 3a chronic kidney disease, without long-term current use of insulin  (CMS/HCC)  empagliflozin  (JARDIANCE ) 25 MG tablet    SITagliptin  (JANUVIA ) 50 MG tablet      3. Type 2 diabetes mellitus without complication, without long-term current use of insulin  (CMS/HCC)             Assessment & Plan  Type 2 Diabetes Mellitus with gastrointestinal side effects  Gastrointestinal symptoms likely due to metformin  and Jardiance . Gastroenterologist suspects metformin  as primary cause. Losartan 's role in angioedema of intestinal tract unlikely. Plan to adjust diabetes medications to assess impact on symptoms. Discussed potential increase in urination with higher Jardiance  dose and importance of monitoring blood glucose levels due to anticipated A1c increase after discontinuing metformin .  - Discontinue metformin  and Janumet .  - Increase Jardiance  to 25 mg daily, noting increased urination as a side effect.  - Continue Januvia  at 50 mg twice daily.  - Monitor blood glucose levels closely.  - Reassess gastrointestinal symptoms after medication adjustment.  - Consider reintroducing metformin  if symptoms do not improve.  - Discuss with gastroenterologist the potential role of losartan  in gastrointestinal symptoms.    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 Fracture likely occurred during cancer surgery and was not addressed due to COVID-19 restrictions. Managed with orthotic inserts and specific footwear.  - Continue using orthotic inserts and recommended footwear.  - Follow up with physical therapy as needed.    Recording duration: 16 minutes    Verbal consent obtained to record this visit.

## 2024-01-23 NOTE — Care and Service Plan (Signed)
 Name:Katelyn Caldwell Age: 73 y.o.   Today's Date: 01/23/2024  Referring Physician: Ami Kail, PA   Date of Injury: No data found 01/09/2024  PT Date Care Plan Established/Reviewed:01/23/2024  PT Date Treatment Started:01/23/2024       Sessions in Plan of Care: 20        Visit Count: 1   Diagnosis:    Diagnosis ICD-10-CM Associated Order   1. Pain in right ankle and joints of right foot  M25.571       2. Peroneal tendonitis, right  M76.71       3. Plantar fasciitis of right foot  M72.2             Precautions: cardiac  osteopenia/osteoporosis  cancer history  hypertension                 BP: 141/78 (drank coffee prior usually in the low 100s) Heart Rate: 67         Subjective      History of Present Illness   Mechanism of injury: repetitive  History of Present Illness: Mrs. Katelyn Caldwell is a 73 y.o. female presenting with R foot/plantar fascia pain. Pt states that this is an old injury that happened when she had breast cancer in 2020, woke up had severe pain in the peroneal tendon, stayed off of it and it went away. About 3 months felt pain and discomfort in the heel tried to use a roller, went to hard. Think she inflamed it, and that came back in March. Potential old cuboid fracture that healed. So only currently has pain on the R heel. After a shower or using heat it feels good, ice doesn't help makes it worse.      April 8th pt had referral appt, got socks, inserts and new [place] shoes that it helps the pain but doesn't reduce it. Denies N/T.   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       Was given exercises to stretch the calf, roller with tennis ball and massage helps the relief. Also notices a little with the inside of the foot.   Functional Limitations (PLOF): Pain with heel with carrying her grandchild (14 months) up the stairs  (Unrestricted, no pain)  Standing is worse than walking , increases pain (Unrestricted, no pain)  Going up and down the  stairs up> down pain (Unrestricted, no pain)  Wears a compression sleeve relieves pain at night (Unrestricted, no pain)     Outcome Measure   Tool Used/Details: FOTO  Score: 41  Predicted Functional Outcome: 60     Pain   Location: R heel       Objective                 BP: 141/78 (drank ocffee prior usually in the low 100s) Heart Rate: 67            Treatment      Therapeutic Exercises - Justified to address any of the following:  To develop strength, endurance, ROM and/or flexibility.   Education on diagnosis/pathology and role of PT.  Discussed how exercises will address impairments found at evaluation and performed the below HEP:     Access Code: [email or web address]  URL: [email or web address]  Date: 01/23/2024  Prepared by: Larae Plaster     Exercises  - Seated Ankle Alphabet  - 2 x daily - 7 x weekly - 1 sets - 26 reps  -  Long Sitting Calf Stretch with Strap  - 2 x daily - 7 x weekly - 3 sets - 30 hold  - Seated Arch Lifts  - 2 x daily - 7 x weekly - 2 sets - 10 reps - 3 hold  - Toe Yoga - Alternating Great Toe and Lesser Toe Extension  - 2 x daily - 7 x weekly - 2 sets - 10 reps - 3 hold  - Standing Ankle Dorsiflexion Stretch on Chair  - 2 x daily - 7 x weekly - 2 sets - 10 reps - 5 hold     Assessment  OBJECTIVE:    (01/23/2024)  Observation of posture: Within Functional Limitations  Ambulation: without AD  Gait: decreased stance phase right  Integumentary: No wound, lesion or rash noted    Functional Strength: (01/23/2024)    Sit to stand transfer: Independent   Heel raise: Bilateral:       x10 p! Heel in standing, p! in seated w/tennis x5        R:              L:   Squat: Partial   (blank fields were intentionally left blank)    Balance (secs): Initial R Initial L   SLS (eyes open) 30 8   Tandem          (blank fields were intentionally left blank)    Initial  Right  AROM Initial  Left AROM Initial  Right PROM Initial Left PROM Ankle/LE ROM (degrees)    4 12   Dorsiflexion    48 50   Plantarflexion    28 32    Inversion    20 20   Eversion    136 138   Knee Flexion    0 0   Knee Extension    (blank fields were intentionally left blank)    Hip AROM: WFL  Knee AROM: WFL    Initial  R Initial  L LE Strength  MMT: /5    4- p! 5 Ankle Dorsiflexion    4 5 Ankle Plantarflexion    4- 4+ Ankle Inversion    4- 4+ Ankle Eversion    4- 4 Quadriceps    4 4+ Hamstrings    3+ 3+ Hip Abduction    (blank fields were intentionally left blank)        Palpation: Pain to palpation: R post tib, peroneals, medial gastroc, calcaneus  Joint Mobility Assessment: hypomobile    End feel: Capsular    Girth/Edema (cm): None noted Initial R Initial L   (blank fields were intentionally left blank)      Neurological testing: Initial R Initial L   Light touch Intact Intact        (blank fields were intentionally left blank)       [name] is a 73 y.o. female presenting with R heel pain, current dx of plantar fasciitis who requires skilled Physical Therapy services. Pt demonstrates limited DF/PF AROM, endurance, and limited global ankle strength. These factors limit pt's ability to perform prolonged standing, walking, stair negotiation and playing her her grandchild tasks  duties  without significant pain levels. Pt will benefit from skilled PT to address limitations and facilitate return to indep ADLs.       Clinical presentation: stable - local pain without causing other issues/symptoms  Barriers to therapy: History of osteopenia, cancer, heart conditions.     Functional Limitations (PLOF): Pain with heel with carrying her grandchild (102  months) up the stairs  (Unrestricted, no pain)  Standing is worse than walking , increases pain (Unrestricted, no pain)  Going up and down the stairs up> down pain (Unrestricted, no pain)  Wears a compression sleeve relieves pain at night (Unrestricted, no pain)  Prognosis: good  Impairments: Decreased range of motion  Decreased strength  Decreased functional stability  Decreased joint mobility  Decreased soft tissue  mobility  Decreased/impaired motor control  Decreased static balance  Plan   Visits per week: 2  Number of Sessions: 20  Direct One on One  16109: Therapeutic Exercise: To Develop Strength and Endurance, ROM and Flexibility  [id number]: Gait Training  60454: Neuromuscular Reeducation (Proprioceptive Neuromuscular Facilitation)  [id number]: Self Care/Home Mgmt Training (ADLs, safety procedures, use of assistive devices)  97140: Manual Therapy techniques (mobilization, manipulation, manual traction) (Grade I-V to talocrural joint, subtalar joint, tarsals, hip, pelvis, and regionally interdependent joints, soft tissue mobilization, instrument assisted soft tissue mobilization.)  97530: Therapeutic Activities: Dynamic activities to improve functional performance  Dry Needling  Supervised Modalities  97010: Thermal modalities: hot/cold packs  91478: Electrical stimulation  Plan for Next Session -: review HEP         Goals      Goal 1: STG:  Patient will improve DF to 15 deg in order to stand for 30 min without heel pain.    Sessions: 8      Goal 2:  Patient will demonstrate SLS to 30 sec in order to negotiate stairs without compensation or heel pain.    Sessions: 20      Goal 3: Patient will demonstrate global ankle strength to 4+/5 MMT in order to carry her grandchild (14 months) on the stairs and performing stair negotiation without heel pain.    Sessions: 20      Goal 4: Patient will demonstrate independence in prescribed HEP with proper form, sets and reps for safe discharge to an independent program.     Sessions: 20                                  Janesha Brissette Margherita Shell, DPT

## 2024-01-23 NOTE — PT/OT Therapy Note (Signed)
 Name: Katelyn Caldwell Age: 73 y.o. Date of Service: 01/23/2024  Referring Physician: Ami Kail, PA  Date of Injury: No data found 01/09/2024   Date of Injury: No data found  Plan of Care Dates From:01/23/2024 To: 04/21/2024  Date Treatment Started: 01/23/2024    Sessions in Plan of Care: 20       Visit Count: 1   Diagnosis:    Diagnosis ICD-10-CM Associated Order   1. Pain in right ankle and joints of right foot  M25.571       2. Peroneal tendonitis, right  M76.71       3. Plantar fasciitis of right foot  M72.2                Subjective     Social Support/Occupation    Lives in: multiple level home    Lives with: adult children    Occupation: Retired           Precautions: cardiac  osteopenia/osteoporosis  cancer history  hypertension  Allergies: Ciprofloxacin  and Nsaids    Objective                 BP: 141/78 (drank ocffee prior usually in the low 100s) Heart Rate: 67               Assessment   Information below copied from evaluation as reference information unless noted/dated on the goal grid:    OBJECTIVE:     (01/23/2024)  Functional Strength: (01/23/2024)    Sit to stand transfer: Independent   Heel raise: Bilateral:       x10 p! Heel in standing, p! in seated w/tennis x5        R:              L:   Squat: Partial   (blank fields were intentionally left blank)     Balance (secs): Initial R Initial L   SLS (eyes open) 30 8   Tandem               (blank fields were intentionally left blank)     Initial  Right  AROM Initial  Left AROM Initial  Right PROM Initial Left PROM Ankle/LE ROM (degrees)     4 12     Dorsiflexion     48 50     Plantarflexion     28 32     Inversion     20 20     Eversion     136 138     Knee Flexion     0 0     Knee Extension     (blank fields were intentionally left blank)       Initial  R Initial  L LE Strength  MMT: /5     4- p! 5 Ankle Dorsiflexion     4 5 Ankle Plantarflexion     4- 4+ Ankle Inversion     4- 4+ Ankle Eversion     4- 4 Quadriceps     4 4+ Hamstrings     3+ 3+ Hip  Abduction     (blank fields were intentionally left blank)       Palpation: Pain to palpation: R post tib, peroneals, medial gastroc, calcaneus  Joint Mobility Assessment: hypomobile       End feel: Capsular          Prepping second visit note.   All pertinent information for this visit is in the  progress note tab.  Plan   Review HEP      Goals      Goal 1: STG:  Patient will improve DF to 15 deg in order to stand for 30 min without heel pain.    Sessions: 8      Goal 2:  Patient will demonstrate SLS to 30 sec in order to negotiate stairs without compensation or heel pain.    Sessions: 20      Goal 3: Patient will demonstrate global ankle strength to 4+/5 MMT in order to carry her grandchild (14 months) on the stairs and performing stair negotiation without heel pain.    Sessions: 20      Goal 4: Patient will demonstrate independence in prescribed HEP with proper form, sets and reps for safe discharge to an independent program.     Sessions: 20                                  Kimberly Nieland Margherita Shell, DPT       Exercise Flow Sheet    Precautions:   --     MD Follow-up:  --  Needs POC signed in 30 days from eval 4/22/2025or UPOC (Medicare or [employer] advantage)       Exercise Specifics   Visit #2   Visit #3   Visit #4   Visit #5   Visit #6   Visit #7   Visit #8   Visit #9   Visit #10   RB          FOTO  PN / FOTO   Ankle 4 way                Arch lifts                Toe yoga                Standing gastroc/soleus                Seated heel raises   Modified height            Gentle DF self mob                                                                           Home Exercise Program               (all exercises were completed with therapist supervision unless noted)    (White date box = 2 week treatment reminder)    Patient HEP:  Access Code: [email or web address]  URL: [email or web address]  Date: 01/23/2024  Prepared by: Larae Plaster     Exercises  - Seated Ankle Alphabet  - 2 x daily - 7 x weekly - 1 sets - 26 reps  -  Long Sitting Calf Stretch with Strap  - 2 x daily - 7 x weekly - 3 sets - 30 hold  - Seated Arch Lifts  - 2 x daily - 7 x weekly - 2 sets - 10 reps - 3 hold  - Toe Yoga - Alternating Great Toe and Lesser Toe Extension  - 2 x daily - 7  x weekly - 2 sets - 10 reps - 3 hold  - Standing Ankle Dorsiflexion Stretch on Chair  - 2 x daily - 7 x weekly - 2 sets - 10 reps - 5 hold     Parts of this note were generated by the Epic EMR system/Dragon speech recognition and may contain inherent errors or omissions not intended by the user. Grammatical errors, random word insertions, deletions, pronoun errors and incomplete sentences are occasional consequences of this technology due to software limitations. Not all errors are caught or corrected.  If there are questions or concerns about the content of this note or information contained within the body of this dictation they should be addressed directly with the author for clarification.

## 2024-01-23 NOTE — Progress Notes (Signed)
 Name: Katelyn Caldwell Age: 73 y.o. Date of Service: 01/23/2024  Referring Physician: Ami Kail, PA  Date of Injury: No data found 01/09/2024  Date of Injury: No data found  Plan of Care Dates From:01/23/2024 To: 04/21/2024  Date Treatment Started: 01/23/2024    Sessions in Plan of Care: 20       Visit Count: 1   Diagnosis:    Diagnosis ICD-10-CM Associated Order   1. Pain in right ankle and joints of right foot  M25.571       2. Peroneal tendonitis, right  M76.71       3. Plantar fasciitis of right foot  M72.2           Subjective     History of Present Illness   Mechanism of injury: repetitive  History of Present Illness: Mrs. Katelyn Caldwell is a 73 y.o. female presenting with R foot/plantar fascia pain. Pt states that this is an old injury that happened when she had breast cancer in 2020, woke up had severe pain in the peroneal tendon, stayed off of it and it went away. About 3 months felt pain and discomfort in the heel tried to use a roller, went to hard. Think she inflamed it, and that came back in March. Potential old cuboid fracture that healed. So only currently has pain on the R heel. After a shower or using heat it feels good, ice doesn't help makes it worse.     April 8th pt had referral appt, got socks, inserts and new Hoka shoes that it helps the pain but doesn't reduce it. Denies N/T.   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      Was given exercises to stretch the calf, roller with tennis ball and massage helps the relief. Also notices a little with the inside of the foot.   Functional Limitations (PLOF): Pain with heel with carrying her grandchild (14 months) up the stairs  (Unrestricted, no pain)  Standing is worse than walking , increases pain (Unrestricted, no pain)  Going up and down the stairs up> down pain (Unrestricted, no pain)  Wears a compression sleeve relieves pain at night (Unrestricted, no pain)    Outcome Measure   Tool  Used/Details: FOTO  Score: 41  Predicted Functional Outcome: 60    Pain   Location: R heel    Social Support/Occupation    Lives in: multiple level home    Lives with: adult children    Occupation: Retired           Precautions: cardiac  osteopenia/osteoporosis  cancer history  hypertension  Allergies: Allergies[1]    Medical History[2]    Objective                 BP: 141/78 (drank ocffee prior usually in the low 100s) Heart Rate: 67          Treatment     Therapeutic Exercises - Justified to address any of the following:  To develop strength, endurance, ROM and/or flexibility.   Education on diagnosis/pathology and role of PT.  Discussed how exercises will address impairments found at evaluation and performed the below HEP:    Access Code: [email or web address]  URL: [email or web address]  Date: 01/23/2024  Prepared by: Larae Plaster    Exercises  - Seated Ankle Alphabet  - 2 x daily - 7 x weekly - 1 sets - 26 reps  - Long Sitting Calf Stretch  with Strap  - 2 x daily - 7 x weekly - 3 sets - 30 hold  - Seated Arch Lifts  - 2 x daily - 7 x weekly - 2 sets - 10 reps - 3 hold  - Toe Yoga - Alternating Great Toe and Lesser Toe Extension  - 2 x daily - 7 x weekly - 2 sets - 10 reps - 3 hold  - Standing Ankle Dorsiflexion Stretch on Chair  - 2 x daily - 7 x weekly - 2 sets - 10 reps - 5 hold    ---      Flowsheet Row ---   Total Time    Timed Minutes 8 minutes   Untimed Minutes 32 minutes   Total Time 40 minutes          Assessment       OBJECTIVE:    (01/23/2024)  Observation of posture: Within Functional Limitations  Ambulation: without AD  Gait: decreased stance phase right  Integumentary: No wound, lesion or rash noted    Functional Strength: (01/23/2024)    Sit to stand transfer: Independent   Heel raise: Bilateral:       x10 p! Heel in standing, p! in seated w/tennis x5        R:              L:   Squat: Partial   (blank fields were intentionally left blank)    Balance (secs): Initial R Initial L   SLS (eyes open) 30 8    Tandem          (blank fields were intentionally left blank)    Initial  Right  AROM Initial  Left AROM Initial  Right PROM Initial Left PROM Ankle/LE ROM (degrees)    4 12   Dorsiflexion    48 50   Plantarflexion    28 32   Inversion    20 20   Eversion    136 138   Knee Flexion    0 0   Knee Extension    (blank fields were intentionally left blank)    Hip AROM: WFL  Knee AROM: WFL    Initial  R Initial  L LE Strength  MMT: /5    4- p! 5 Ankle Dorsiflexion    4 5 Ankle Plantarflexion    4- 4+ Ankle Inversion    4- 4+ Ankle Eversion    4- 4 Quadriceps    4 4+ Hamstrings    3+ 3+ Hip Abduction    (blank fields were intentionally left blank)        Palpation: Pain to palpation: R post tib, peroneals, medial gastroc, calcaneus  Joint Mobility Assessment: hypomobile    End feel: Capsular    Girth/Edema (cm): None noted Initial R Initial L   (blank fields were intentionally left blank)      Neurological testing: Initial R Initial L   Light touch Intact Intact        (blank fields were intentionally left blank)       Katelyn Caldwell is a 73 y.o. female presenting with R heel pain, current dx of plantar fasciitis who requires skilled Physical Therapy services. Pt demonstrates limited DF/PF AROM, endurance, and limited global ankle strength. These factors limit pt's ability to perform prolonged standing, walking, stair negotiation and playing her her grandchild tasks  duties  without significant pain levels. Pt will benefit from skilled PT to address limitations and facilitate return to indep ADLs.  Clinical presentation: stable - local pain without causing other issues/symptoms  Barriers to therapy: History of osteopenia, cancer, heart conditions.     Functional Limitations (PLOF): Pain with heel with carrying her grandchild (14 months) up the stairs  (Unrestricted, no pain)  Standing is worse than walking , increases pain (Unrestricted, no pain)  Going up and down the stairs up> down pain (Unrestricted, no pain)  Wears a  compression sleeve relieves pain at night (Unrestricted, no pain)  Prognosis: good  Impairments: Decreased range of motion  Decreased strength  Decreased functional stability  Decreased joint mobility  Decreased soft tissue mobility  Decreased/impaired motor control  Decreased static balance  Plan   Visits per week: 2  Number of Sessions: 20  Direct One on One  98119: Therapeutic Exercise: To Develop Strength and Endurance, ROM and Flexibility  [id number]: Gait Training  14782: Neuromuscular Reeducation (Proprioceptive Neuromuscular Facilitation)  [phone]: Self Care/Home Mgmt Training (ADLs, safety procedures, use of assistive devices)  97140: Manual Therapy techniques (mobilization, manipulation, manual traction) (Grade I-V to talocrural joint, subtalar joint, tarsals, hip, pelvis, and regionally interdependent joints, soft tissue mobilization, instrument assisted soft tissue mobilization.)  97530: Therapeutic Activities: Dynamic activities to improve functional performance  Dry Needling  Supervised Modalities  97010: Thermal modalities: hot/cold packs  30865: Electrical stimulation  Plan for Next Session -: review HEP         Goals      Goal 1: STG:  Patient will improve DF to 15 deg in order to stand for 30 min without heel pain.    Sessions: 8      Goal 2:  Patient will demonstrate SLS to 30 sec in order to negotiate stairs without compensation or heel pain.    Sessions: 20      Goal 3: Patient will demonstrate global ankle strength to 4+/5 MMT in order to carry her grandchild (14 months) on the stairs and performing stair negotiation without heel pain.    Sessions: 20      Goal 4: Patient will demonstrate independence in prescribed HEP with proper form, sets and reps for safe discharge to an independent program.     Sessions: 20                                  Katelyn Caldwell J Katelyn Caldwell, DPT      Parts of this note were generated by the Epic EMR system/Dragon speech recognition and may contain inherent errors or omissions not  intended by the user. Grammatical errors, random word insertions, deletions, pronoun errors and incomplete sentences are occasional consequences of this technology due to software limitations. Not all errors are caught or corrected.  If there are questions or concerns about the content of this note or information contained within the body of this dictation they should be addressed directly with the author for clarification.         [1]   Allergies  Allergen Reactions    Ciprofloxacin  Other (See Comments)     Severe Connective Tissue (joint) pain and stiffness    Nsaids Other (See Comments)     Pt states this causes her bleeding d/t PAN   [2]   Past Medical History:  Diagnosis Date    Abnormal vision     readers    Ankle dislocation 1964    Ankle sprain 1985    Anxiety     no meds  Arrhythmia     PVCs    Bilateral cataracts     bil eye surgery    Bursitis     Chronic kidney disease     Stage 3 CKD - d/t PAN    CKD (chronic kidney disease), stage III (CMS/HCC)     Coagulation disorder September 2003    PAN    Diabetes insipidus     Diabetes mellitus, type 2 (CMS/HCC) 01/22/2021    A1C = 7, FBS = 125 08/09/22 FSB 137    Ear, nose and throat disorder October 2018    spenoid sinus surgery    Encounter for blood transfusion 2003    when dx with PAN    Exocrine pancreatic insufficiency     EPI     Fracture     right big toe, right hand, left humerus fx    Fracture of hand 2014    Right hand    Fracture, foot 2014    Big toe, right    Fracture, humerus 2012    Shatteted left proximal humerus    Gastroesophageal reflux disease     Heart disease 2015    Hemangioma of liver 08/04/2022    Hemorrhagic diathesis 06/2002    PAN diagnosed at this time    Hypercalcemia 05/29/2018    Noted recently with renal labs given hx of renal disease.  Stopped vitamin d  and calcium  supplements being taken at that time about 1 month ago.  No hx renal stones, no psychosis, no abdominal pain.  Has occurred prior with normal ionized calcium  on  review.   1/23:  Last evaluation including vitamin D  normal about 1 month ago.  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  Had palpitations which have reduced with initiation of toprol .  Pt notes at [facility] for chest pain and URI and underwent EKG. 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  She feels fine. Denies chest pain.  Sx resolved sponta    Psoriasis     scalp    Renal insufficiency     stage 3 2/2 pan    Sleep apnea     CPAP nightly.    Tricuspid regurgitation     trace    UTI (urinary tract infection) 08/09/2022    pt started on Bactrim  PO for 10 days

## 2024-01-29 ENCOUNTER — Encounter (INDEPENDENT_AMBULATORY_CARE_PROVIDER_SITE_OTHER): Payer: Self-pay | Admitting: Family Medicine

## 2024-01-31 ENCOUNTER — Inpatient Hospital Stay

## 2024-01-31 DIAGNOSIS — M25571 Pain in right ankle and joints of right foot: Secondary | ICD-10-CM

## 2024-01-31 DIAGNOSIS — M7671 Peroneal tendinitis, right leg: Secondary | ICD-10-CM

## 2024-01-31 DIAGNOSIS — M722 Plantar fascial fibromatosis: Secondary | ICD-10-CM

## 2024-01-31 NOTE — PT/OT Therapy Note (Signed)
 Name: Katelyn Caldwell Age: 73 y.o. Date of Service: 01/31/2024  Referring Physician: Ami Kail, PA  Date of Injury: No data found 01/09/2024   Date of Injury: No data found  Plan of Care Dates From:01/23/2024 To: 04/21/2024  Date Treatment Started: 01/23/2024    Sessions in Plan of Care: 20       Visit Count: 2   Diagnosis:    Diagnosis ICD-10-CM Associated Order   1. Pain in right ankle and joints of right foot  M25.571       2. Peroneal tendonitis, right  M76.71       3. Plantar fasciitis of right foot  M72.2                  Subjective     Daily Subjective   Pt reports she has been feeling good with doing the exercises. States the pain comes and goes.     Social Support/Occupation    Lives in: multiple level home    Lives with: adult children    Occupation: Retired           Precautions: cardiac  osteopenia/osteoporosis  cancer history  hypertension  Allergies: Ciprofloxacin  and Nsaids    Objective                          Information below copied from evaluation as reference information unless noted/dated on the goal grid:    Functional Strength: (01/23/2024)    Sit to stand transfer: Independent   Heel raise: Bilateral:       x10 p! Heel in standing, p! in seated w/tennis x5        R:              L:   Squat: Partial   (blank fields were intentionally left blank)     Balance (secs): Initial R Initial L   SLS (eyes open) 30 8   Tandem               (blank fields were intentionally left blank)     Initial  Right  AROM Initial  Left AROM Initial  Right PROM Initial Left PROM Ankle/LE ROM (degrees)     4 12     Dorsiflexion     48 50     Plantarflexion     28 32     Inversion     20 20     Eversion     136 138     Knee Flexion     0 0     Knee Extension     (blank fields were intentionally left blank)       Initial  R Initial  L LE Strength  MMT: /5     4- p! 5 Ankle Dorsiflexion     4 5 Ankle Plantarflexion     4- 4+ Ankle Inversion     4- 4+ Ankle Eversion     4- 4 Quadriceps     4 4+ Hamstrings     3+ 3+ Hip  Abduction     (blank fields were intentionally left blank)       Palpation: Pain to palpation: R post tib, peroneals, medial gastroc, calcaneus  Joint Mobility Assessment: hypomobile       End feel: Capsular             Treatment     Therapeutic Exercises - Justified to address any of  the following:  To develop strength, endurance, ROM and/or flexibility.   Active warm-up on RB to promote blood flow and increase tissue extensibility while subjective history obtained to guide treatment session today.     See flowsheet for parameters: vc for setup and sequence  - Standing gastroc stretch at standing wedge  - Ankle 3-way with YTB for strengthening  - standing heel raises for gastroc strength  - TCJ A-P glide with red power band on stool    Reviewed HEP      Neuromuscular Re-Education - Justified to address any of the following:   Re-education of movement, balance, coordination, kinesthetic sense, posture and/or proprioception for sitting and/or standing activities.   - Toe yoga and arch lifts in sitting for foot intrinsic activation    Manual Therapy - Justified to address any of the following:    Mobilization of joints and soft tissues, manipulation, manual lymphatic drainage, and/or manual traction.    Long sitting:  - Talocrural A-P glide gr I-II    ---      Flowsheet Row ---   Total Time    Timed Minutes 40 minutes   Total Time 40 minutes          Assessment      Pt presents to first f/u for R foot/ankle pain. Mild local tenderness on medial malleolous with TCJ A-P glides. Tenderness and soreness at cuboid with eversion and dorsiflexion strengthening but not painful. Local tightness in toes foot intrinsic strengthening with pt noting she had a prior fracture of great toe years ago. Good stretch sensation with no pain during calf or CKC DF with TCJ mob.  Plan   Cont per POC  F/u on response to treatment      Goals      Goal 1: STG:  Patient will improve DF to 15 deg in order to stand for 30 min without heel pain.     Sessions: 8      Goal 2:  Patient will demonstrate SLS to 30 sec in order to negotiate stairs without compensation or heel pain.    Sessions: 20      Goal 3: Patient will demonstrate global ankle strength to 4+/5 MMT in order to carry her grandchild (14 months) on the stairs and performing stair negotiation without heel pain.    Sessions: 20      Goal 4: Patient will demonstrate independence in prescribed HEP with proper form, sets and reps for safe discharge to an independent program.     Sessions: 20                                    Algernon Ing, DPT       Exercise Flow Sheet    Precautions:   --     MD Follow-up:  --  Needs POC signed in 30 days from eval 4/22/2025or UPOC (Medicare or UHC advantage)       Exercise Specifics   Visit #2   Visit #3   Visit #4   Visit #5   Visit #6   Visit #7   Visit #8   Visit #9   Visit #10   RB    5'      FOTO  PN / FOTO   Ankle 4 way     3-way YTB 30x           Arch lifts  x1'           Toe yoga     x1'           Standing gastroc/soleus     3x30"           Seated heel raises   Modified height Standing 2x10           Gentle DF self mob    2x10 red power band A-P glide with 12 inch box                                                                       Home Exercise Program    Reviewed           (all exercises were completed with therapist supervision unless noted)    (White date box = 2 week treatment reminder)    Patient HEP:  Access Code: [email or web address]  URL: [email or web address]  Date: 01/23/2024  Prepared by: Larae Plaster     Exercises  - Seated Ankle Alphabet  - 2 x daily - 7 x weekly - 1 sets - 26 reps  - Long Sitting Calf Stretch with Strap  - 2 x daily - 7 x weekly - 3 sets - 30 hold  - Seated Arch Lifts  - 2 x daily - 7 x weekly - 2 sets - 10 reps - 3 hold  - Toe Yoga - Alternating Great Toe and Lesser Toe Extension  - 2 x daily - 7 x weekly - 2 sets - 10 reps - 3 hold  - Standing Ankle Dorsiflexion Stretch on Chair  - 2 x daily - 7 x weekly - 2 sets -  10 reps - 5 hold     Parts of this note were generated by the Epic EMR system/Dragon speech recognition and may contain inherent errors or omissions not intended by the user. Grammatical errors, random word insertions, deletions, pronoun errors and incomplete sentences are occasional consequences of this technology due to software limitations. Not all errors are caught or corrected.  If there are questions or concerns about the content of this note or information contained within the body of this dictation they should be addressed directly with the author for clarification.

## 2024-02-01 NOTE — PT/OT Therapy Note (Signed)
 Name: Katelyn Caldwell Age: 73 y.o. Date of Service: 02/02/2024  Referring Physician: Ami Kail, PA  Date of Injury: No data found 01/09/2024   Date of Injury: No data found  Plan of Care Dates From:01/23/2024 To: 04/21/2024  Date Treatment Started: 01/23/2024    Sessions in Plan of Care: 20       Visit Count: 3   Diagnosis:    Diagnosis ICD-10-CM Associated Order   1. Pain in right ankle and joints of right foot  M25.571       2. Peroneal tendonitis, right  M76.71       3. Plantar fasciitis of right foot  M72.2                    Subjective     Daily Subjective   Patient states that she feels optimistic , has been feeling good with the exercises and pain in the heel has been getting better.     Social Support/Occupation    Lives in: multiple level home    Lives with: adult children    Occupation: Retired           Precautions: cardiac  osteopenia/osteoporosis  cancer history  hypertension  Allergies: Ciprofloxacin  and Nsaids    Objective                          Information below copied from evaluation as reference information unless noted/dated on the goal grid:    Functional Strength: (01/23/2024)    Sit to stand transfer: Independent   Heel raise: Bilateral:       x10 p! Heel in standing, p! in seated w/tennis x5        R:              L:   Squat: Partial   (blank fields were intentionally left blank)     Balance (secs): Initial R Initial L   SLS (eyes open) 30 8   Tandem               (blank fields were intentionally left blank)     Initial  Right  AROM Initial  Left AROM Initial  Right PROM Initial Left PROM Ankle/LE ROM (degrees)     4 12     Dorsiflexion     48 50     Plantarflexion     28 32     Inversion     20 20     Eversion     136 138     Knee Flexion     0 0     Knee Extension     (blank fields were intentionally left blank)       Initial  R Initial  L LE Strength  MMT: /5     4- p! 5 Ankle Dorsiflexion     4 5 Ankle Plantarflexion     4- 4+ Ankle Inversion     4- 4+ Ankle Eversion     4- 4  Quadriceps     4 4+ Hamstrings     3+ 3+ Hip Abduction     (blank fields were intentionally left blank)       Palpation: Pain to palpation: R post tib, peroneals, medial gastroc, calcaneus  Joint Mobility Assessment: hypomobile       End feel: Capsular             Treatment  Therapeutic Exercises - Justified to address any of the following:  To develop strength, endurance, ROM and/or flexibility.   Active warm-up on RB to promote blood flow and increase tissue extensibility while subjective history obtained to guide treatment session today.     See flowsheet for parameters: vc for setup and sequence  - Standing gastroc stretch at standing wedge  - Ankle 3-way with YTB for strengthening  - standing heel raises for gastroc strength  - TCJ A-P glide with red power band on 12" box and stool     Reviewed HEP      Neuromuscular Re-Education - Justified to address any of the following:   Re-education of movement, balance, coordination, kinesthetic sense, posture and/or proprioception for sitting and/or standing activities.   For foot intrinsic activation:  - seated arch lifts, vcs to inc 3 point contact  -towel scrunches, for great toe flexor   -toe yoga, vcs for great toe isolation/FHB isolation    Side steps no resist toe pointed fwd, for glute med activation.    Standing hip abd/ext taps, BUE on TM railing, to inc glute activation.      Manual Therapy - Justified to address any of the following:    Mobilization of joints and soft tissues, manipulation, manual lymphatic drainage, and/or manual traction.    Long sitting:  - Talocrural A-P glide gr I-II  Calcaneal distraction gr II     ---      Flowsheet Row ---   Total Time    Timed Minutes 41 minutes   Total Time 41 minutes            Assessment      Pt presents to 2nd f/u for R foot/ankle pain. Continued tenderness near cuboid and ant joint line with manual, only felt slight stretch with AP TCJ mobs today. Knee extended gastroc stretch pt felt more in calf, vs knee  bent. Added standing hip strengthening exercises, pt felt fatigue in glutes during side steps. Updated HEP for improving hip stability, continue to address reducing pain with ambulation.   Plan   Cont per POC  F/u on response to treatment  Obj next visit       Goals      Goal 1: STG:  Patient will improve DF to 15 deg in order to stand for 30 min without heel pain.    Sessions: 8      Goal 2:  Patient will demonstrate SLS to 30 sec in order to negotiate stairs without compensation or heel pain.    Sessions: 20      Goal 3: Patient will demonstrate global ankle strength to 4+/5 MMT in order to carry her grandchild (14 months) on the stairs and performing stair negotiation without heel pain.    Sessions: 20      Goal 4: Patient will demonstrate independence in prescribed HEP with proper form, sets and reps for safe discharge to an independent program.     Sessions: 20                                      Katelyn Caldwell, DPT       Exercise Flow Sheet    Precautions:   --     MD Follow-up:  --  Needs POC signed in 30 days from eval 4/22/2025or UPOC (Medicare or UHC advantage)       Exercise  Specifics   Visit #2   Visit #3   Visit #4   Visit #5   Visit #6   Visit #7   Visit #8   Visit #9   Visit #10   RB    5' 5'     FOTO  PN / FOTO   Ankle 4 way     3-way YTB 30x 3-way YTB 30x          Arch lifts     x1' x1'          Toe yoga     x1\' 5" x20          Standing gastroc/soleus     3x30" 3x30"          Seated heel raises   Modified height Standing 2x10 Standing 2x10          Gentle DF self mob    2x10 red power band A-P glide with 12 inch box 5"x10 on stool   12" x1'               Standing hip abd/ext x10 B                Side step no resist 3 short laps                Towel scrunches 2x8 p!  D/c                         Home Exercise Program    Reviewed Updated           (all exercises were completed with therapist supervision unless noted)    (White date box = 2 week treatment reminder)    Patient HEP:  Access Code:  [email or web address]  URL: [email or web address]  Date: 02/02/2024  Prepared by: Larae Plaster    Exercises  - Seated Ankle Alphabet  - 2 x daily - 7 x weekly - 1 sets - 26 reps  - Long Sitting Calf Stretch with Strap  - 2 x daily - 7 x weekly - 3 sets - 30 hold  - Seated Arch Lifts  - 2 x daily - 7 x weekly - 2 sets - 10 reps - 3 hold  - Toe Yoga - Alternating Great Toe and Lesser Toe Extension  - 2 x daily - 7 x weekly - 2 sets - 10 reps - 3 hold  - Standing Ankle Dorsiflexion Stretch on Chair  - 2 x daily - 7 x weekly - 2 sets - 10 reps - 5 hold  - Standing Hip Abduction with Counter Support  - 1 x daily - 7 x weekly - 2 sets - 10 reps  - Standing Hip Extension with Counter Support  - 1 x daily - 7 x weekly - 2 sets - 10 reps  Parts of this note were generated by the Epic EMR system/Dragon speech recognition and may contain inherent errors or omissions not intended by the user. Grammatical errors, random word insertions, deletions, pronoun errors and incomplete sentences are occasional consequences of this technology due to software limitations. Not all errors are caught or corrected.  If there are questions or concerns about the content of this note or information contained within the body of this dictation they should be addressed directly with the author for clarification.

## 2024-02-02 ENCOUNTER — Inpatient Hospital Stay: Attending: Medical

## 2024-02-02 DIAGNOSIS — M7671 Peroneal tendinitis, right leg: Secondary | ICD-10-CM | POA: Insufficient documentation

## 2024-02-02 DIAGNOSIS — M25571 Pain in right ankle and joints of right foot: Secondary | ICD-10-CM | POA: Insufficient documentation

## 2024-02-02 DIAGNOSIS — M722 Plantar fascial fibromatosis: Secondary | ICD-10-CM | POA: Insufficient documentation

## 2024-02-07 ENCOUNTER — Inpatient Hospital Stay

## 2024-02-07 DIAGNOSIS — M722 Plantar fascial fibromatosis: Secondary | ICD-10-CM

## 2024-02-07 DIAGNOSIS — M7671 Peroneal tendinitis, right leg: Secondary | ICD-10-CM

## 2024-02-07 DIAGNOSIS — M25571 Pain in right ankle and joints of right foot: Secondary | ICD-10-CM

## 2024-02-07 NOTE — PT/OT Therapy Note (Signed)
 Name: Katelyn Caldwell Age: 73 y.o. Date of Service: 02/07/2024  Referring Physician: Lafrances Almarie DEL, PA  Date of Injury: No data found 01/09/2024   Date of Injury: No data found  Plan of Care Dates From:01/23/2024 To: 04/21/2024  Date Treatment Started: 01/23/2024    Sessions in Plan of Care: 20       Visit Count: 4   Diagnosis:    Diagnosis ICD-10-CM Associated Order   1. Pain in right ankle and joints of right foot  M25.571       2. Peroneal tendonitis, right  M76.71       3. Plantar fasciitis of right foot  M72.2                      Subjective     Daily Subjective   Patient states she is feeling less pain in the  foot. Notes she feels like because the foot has been painful she is compensating with the back and now the midline low back and off to the L side is feeling more painful.     Social Support/Occupation    Lives in: multiple level home    Lives with: adult children    Occupation: Retired           Precautions: cardiac  osteopenia/osteoporosis  cancer history  hypertension  Allergies: Ciprofloxacin  and Nsaids    Objective                          Information below copied from evaluation as reference information unless noted/dated on the goal grid:    Functional Strength: (01/23/2024)    Sit to stand transfer: Independent   Heel raise: Bilateral:       x10 p! Heel in standing, p! in seated w/tennis x5        R:              L:   Squat: Partial   (blank fields were intentionally left blank)     Balance (secs): Initial R Initial L   SLS (eyes open) 30 8   Tandem               (blank fields were intentionally left blank)     Initial  Right  AROM Initial  Left AROM Ankle/LE ROM (degrees)  5/7 R AROM   4 12 Dorsiflexion  8   48 50 Plantarflexion     28 32 Inversion     20 20 Eversion     136 138 Knee Flexion     0 0 Knee Extension     (blank fields were intentionally left blank)       Initial  R Initial  L LE Strength  MMT: /5     4- p! 5 Ankle Dorsiflexion     4 5 Ankle Plantarflexion     4- 4+ Ankle Inversion      4- 4+ Ankle Eversion     4- 4 Quadriceps     4 4+ Hamstrings     3+ 3+ Hip Abduction     (blank fields were intentionally left blank)       Palpation: Pain to palpation: R post tib, peroneals, medial gastroc, calcaneus  Joint Mobility Assessment: hypomobile       End feel: Capsular             Treatment     Therapeutic Exercises - Justified to address any  of the following:  To develop strength, endurance, ROM and/or flexibility.   Active warm-up on RB to promote blood flow and increase tissue extensibility while subjective history obtained to guide treatment session today.     See flowsheet for parameters: vc for setup and sequence  - Standing gastroc stretch at standing wedge  - Ankle 3-way progressed with RTB for strengthening  - standing heel raises for gastroc strength  - TCJ A-P glide with red power band on 12 box and stool    Neuromuscular Re-Education - Justified to address any of the following:   Re-education of movement, balance, coordination, kinesthetic sense, posture and/or proprioception for sitting and/or standing activities.   For foot intrinsic activation:  - seated arch lifts, vcs to inc 3 point contact  - toe yoga, vcs for great toe isolation/FHB isolation    + SLS vectors on airex for balance and foot/ankle activation    + isometric clamshells with YTB     Manual Therapy - Justified to address any of the following:    Mobilization of joints and soft tissues, manipulation, manual lymphatic drainage, and/or manual traction.    Long sitting:  - Talocrural A-P glide gr I-II  - Calcaneal distraction gr II     ---      Flowsheet Row ---   Total Time    Timed Minutes 45 minutes   Total Time 45 minutes              Assessment      Pt presents to 3rd f/u for R foot/ankle pain. Demonstrates improvements in ankle DF AROM since initial eval. Still tenderness locally with talocrural joint mobility. Discontinued towel scrunches today due to pain. Low back and hip symptoms reduced following isometric  clamshells. Continue skilled PT to address reducing pain with ambulation.   Plan   Cont per POC  F/u on response to treatment      Goals      Goal 1: STG:  Patient will improve DF to 15 deg in order to stand for 30 min without heel pain.     5/7: DF progressed to 8 degrees. Notes standing for cooking will have to take sitting breaks often. Has not been able to stand 30 minutes. - MD   Sessions: 8      Goal 2:  Patient will demonstrate SLS to 30 sec in order to negotiate stairs without compensation or heel pain.    Sessions: 20      Goal 3: Patient will demonstrate global ankle strength to 4+/5 MMT in order to carry her grandchild (14 months) on the stairs and performing stair negotiation without heel pain.    Sessions: 20      Goal 4: Patient will demonstrate independence in prescribed HEP with proper form, sets and reps for safe discharge to an independent program.     Sessions: 20                                        Jonette Ravel, DPT       Exercise Flow Sheet    Precautions:   --     MD Follow-up:  --  Needs POC signed in 30 days from eval 4/22/2025or UPOC (Medicare or U[employer] advantage)       Exercise Specifics   Visit #2   Visit #3   Visit #4   Visit #5  Visit #6   Visit #7   Visit #8   Visit #9   Visit #10   RB    5' 5' 5'    FOTO  PN / FOTO   Ankle 4 way     3-way YTB 30x 3-way YTB 30x 3-way RTB 30x         Arch lifts     x1' x1' x1         Toe yoga     x1' 5x20 5x20         Standing gastroc/soleus     3x30 3x30 3x30         Seated heel raises   Modified height Standing 2x10 Standing 2x10 Standing 2x10         Gentle DF self mob    2x10 red power band A-P glide with 12 inch box 5x10 on stool   12 x1' 5x10 on stool   12 x1'              Standing hip abd/ext x10 B  SLS vectors airex 2x10 bil              Side step no resist 3 short laps                Towel scrunches 2x8 p!  D/c -               Iso clams 3x30 YTB         Home Exercise Program    Reviewed Updated  Updated         (all exercises were  completed with therapist supervision unless noted)    (White date box = 2 week treatment reminder)    Patient HEP:  Access Code: [email or web address]  URL: [email or web address]  Date: 02/02/2024  Prepared by: Glady Caddy    Exercises  - Seated Ankle Alphabet  - 2 x daily - 7 x weekly - 1 sets - 26 reps  - Long Sitting Calf Stretch with Strap  - 2 x daily - 7 x weekly - 3 sets - 30 hold  - Seated Arch Lifts  - 2 x daily - 7 x weekly - 2 sets - 10 reps - 3 hold  - Toe Yoga - Alternating Great Toe and Lesser Toe Extension  - 2 x daily - 7 x weekly - 2 sets - 10 reps - 3 hold  - Standing Ankle Dorsiflexion Stretch on Chair  - 2 x daily - 7 x weekly - 2 sets - 10 reps - 5 hold  - Standing Hip Abduction with Counter Support  - 1 x daily - 7 x weekly - 2 sets - 10 reps  - Standing Hip Extension with Counter Support  - 1 x daily - 7 x weekly - 2 sets - 10 reps    Parts of this note were generated by the Epic EMR system/Dragon speech recognition and may contain inherent errors or omissions not intended by the user. Grammatical errors, random word insertions, deletions, pronoun errors and incomplete sentences are occasional consequences of this technology due to software limitations. Not all errors are caught or corrected.  If there are questions or concerns about the content of this note or information contained within the body of this dictation they should be addressed directly with the author for clarification.

## 2024-02-08 ENCOUNTER — Encounter (INDEPENDENT_AMBULATORY_CARE_PROVIDER_SITE_OTHER): Payer: Self-pay

## 2024-02-15 ENCOUNTER — Inpatient Hospital Stay

## 2024-02-15 DIAGNOSIS — M7671 Peroneal tendinitis, right leg: Secondary | ICD-10-CM

## 2024-02-15 DIAGNOSIS — M722 Plantar fascial fibromatosis: Secondary | ICD-10-CM

## 2024-02-15 DIAGNOSIS — M25571 Pain in right ankle and joints of right foot: Secondary | ICD-10-CM

## 2024-02-15 NOTE — PT/OT Therapy Note (Signed)
 Name: Katelyn Caldwell Age: 73 y.o. Date of Service: 02/15/2024  Referring Physician: Lafrances Almarie DEL, PA  Date of Injury: No data found 01/09/2024   Date of Injury: No data found  Plan of Care Dates From:01/23/2024 To: 04/21/2024  Date Treatment Started: 01/23/2024    Sessions in Plan of Care: 20       Visit Count: 5   Diagnosis:    Diagnosis ICD-10-CM Associated Order   1. Pain in right ankle and joints of right foot  M25.571       2. Peroneal tendonitis, right  M76.71       3. Plantar fasciitis of right foot  M72.2                        Subjective     Daily Subjective   Patient states she feels like she overdid something as is feeling more of the foot/ankle pain again. States the back pain has reduced since last time, still there but barely.    Social Support/Occupation    Lives in: multiple level home         Occupation: Retired           Precautions: cardiac  osteopenia/osteoporosis  cancer history  hypertension  Allergies: Ciprofloxacin  and Nsaids    Objective                          Information below copied from evaluation as reference information unless noted/dated on the goal grid:    Functional Strength: (01/23/2024)    Sit to stand transfer: Independent   Heel raise: Bilateral:       x10 p! Heel in standing, p! in seated w/tennis x5        R:              L:   Squat: Partial   (blank fields were intentionally left blank)     Balance (secs): Initial R Initial L   SLS (eyes open) 30 8   Tandem               (blank fields were intentionally left blank)     Initial  Right  AROM Initial  Left AROM Ankle/LE ROM (degrees)  5/7 R AROM   4 12 Dorsiflexion  8   48 50 Plantarflexion     28 32 Inversion     20 20 Eversion     136 138 Knee Flexion     0 0 Knee Extension     (blank fields were intentionally left blank)    Initial  R Initial  L LE Strength  MMT: /5     4- p! 5 Ankle Dorsiflexion     4 5 Ankle Plantarflexion     4- 4+ Ankle Inversion     4- 4+ Ankle Eversion     4- 4 Quadriceps     4 4+ Hamstrings     3+  3+ Hip Abduction     (blank fields were intentionally left blank)               Treatment     Therapeutic Exercises - Justified to address any of the following:  To develop strength, endurance, ROM and/or flexibility.   Active warm-up on RB to promote blood flow and increase tissue extensibility while subjective history obtained to guide treatment session today.     See flowsheet for parameters: vc for setup and  sequence  - Standing gastroc stretch at standing wedge  - Ankle 3-way continued with RTB for strengthening  - standing heel raises for gastroc strength  - TCJ A-P glide with red power band on 12 box and stool    Neuromuscular Re-Education - Justified to address any of the following:   Re-education of movement, balance, coordination, kinesthetic sense, posture and/or proprioception for sitting and/or standing activities.   For foot intrinsic activation:  - standing arch lifts, vcs to inc 3 point contact  - standing toe yoga, vcs for great toe isolation/FHB isolation    - SLS vectors on airex for balance and foot/ankle activation    + wobble board lvl 1 CW/CCW    + standing marches with RTB on forefeet - cues to maintain DF    Manual Therapy - Justified to address any of the following:    Mobilization of joints and soft tissues, manipulation, manual lymphatic drainage, and/or manual traction.    Long sitting:  - Talocrural A-P glide gr I-II  - Calcaneal distraction gr II     ---      Flowsheet Row ---   Total Time    Timed Minutes 45 minutes   Total Time 45 minutes                Assessment      Pt presents to 4th f/u for R foot/ankle pain. Mobility improving through talocrural joint with dec pain now. Also reduced TTP along plantar foot. Limited with balance during wobble board but good ability to keep board on floor throughout. Progressed heel raises to off 2 inch step with good tolerance, updated HEP accordingly. Continue skilled PT to address reducing pain with ambulation.   Plan   Cont per POC  F/u on  response to treatment      Goals      Goal 1: STG:  Patient will improve DF to 15 deg in order to stand for 30 min without heel pain.     5/7: DF progressed to 8 degrees. Notes standing for cooking will have to take sitting breaks often. Has not been able to stand 30 minutes. - MD   Sessions: 8      Goal 2:  Patient will demonstrate SLS to 30 sec in order to negotiate stairs without compensation or heel pain.    Sessions: 20      Goal 3: Patient will demonstrate global ankle strength to 4+/5 MMT in order to carry her grandchild (14 months) on the stairs and performing stair negotiation without heel pain.    Sessions: 20      Goal 4: Patient will demonstrate independence in prescribed HEP with proper form, sets and reps for safe discharge to an independent program.     Sessions: 20                                          Jonette Ravel, DPT       Exercise Flow Sheet    Precautions:   --     MD Follow-up:  --  Needs POC signed in 30 days from eval 4/22/2025or UPOC (Medicare or UHC advantage)       Exercise Specifics   Visit #2   Visit #3   Visit #4   Visit #5   Visit #6   Visit #7   Visit #8  Visit #9   Visit #10   RB    5' 5' 5' 5'   FOTO  PN / FOTO   Ankle 4 way     3-way YTB 30x 3-way YTB 30x 3-way RTB 30x 3-way RTB 30x        Arch lifts     x1' x1' x1 Standing x1'        Toe yoga     x1' 5x20 5x20 5x20        Standing gastroc/soleus     3x30 3x30 3x30 3x30        Seated heel raises   Modified height Standing 2x10 Standing 2x10 Standing 2x10 Standing 2x10        Gentle DF self mob    2x10 red power band A-P glide with 12 inch box 5x10 on stool   12 x1' 5x10 on stool   12 x1' 5x10 on stool   12 x1'             Standing hip abd/ext x10 B  SLS vectors airex 2x10 bil SLS vectors airex 2x10 bil             Side step no resist 3 short laps   Standing marches RTB forefoot 2x10 bil             Towel scrunches 2x8 p!  D/c - Wobble board CW/CCW lvl 1    5x each              Iso clams 3x30 YTB Iso clams 3x30  YTB        Home Exercise Program    Reviewed Updated  Updated         (all exercises were completed with therapist supervision unless noted)    (White date box = 2 week treatment reminder)    Patient HEP:  Access Code: [email or web address]  URL: [email or web address]  Date: 02/02/2024  Prepared by: Glady Caddy    Exercises  - Seated Ankle Alphabet  - 2 x daily - 7 x weekly - 1 sets - 26 reps  - Long Sitting Calf Stretch with Strap  - 2 x daily - 7 x weekly - 3 sets - 30 hold  - Seated Arch Lifts  - 2 x daily - 7 x weekly - 2 sets - 10 reps - 3 hold  - Toe Yoga - Alternating Great Toe and Lesser Toe Extension  - 2 x daily - 7 x weekly - 2 sets - 10 reps - 3 hold  - Standing Ankle Dorsiflexion Stretch on Chair  - 2 x daily - 7 x weekly - 2 sets - 10 reps - 5 hold  - Standing Hip Abduction with Counter Support  - 1 x daily - 7 x weekly - 2 sets - 10 reps  - Standing Hip Extension with Counter Support  - 1 x daily - 7 x weekly - 2 sets - 10 reps    Parts of this note were generated by the Epic EMR system/Dragon speech recognition and may contain inherent errors or omissions not intended by the user. Grammatical errors, random word insertions, deletions, pronoun errors and incomplete sentences are occasional consequences of this technology due to software limitations. Not all errors are caught or corrected.  If there are questions or concerns about the content of this note or information contained within the body of this dictation they should be addressed directly with the author for clarification.

## 2024-02-28 NOTE — PT/OT Therapy Note (Signed)
 Name: Katelyn Caldwell Age: 73 y.o. Date of Service: 02/29/2024  Referring Physician: Lafrances Almarie DEL, PA  Date of Injury: No data found 01/09/2024   Date of Injury: 01/09/2024  Plan of Care Dates From:01/23/2024 To: 04/21/2024  Date Treatment Started: 01/23/2024    Sessions in Plan of Care: 20       Visit Count: 6   Diagnosis:    Diagnosis ICD-10-CM Associated Order   1. Pain in right ankle and joints of right foot  M25.571       2. Peroneal tendonitis, right  M76.71       3. Plantar fasciitis of right foot  M72.2                          Subjective     Daily Subjective   Patient states that her L hip is feeling better, states that the R ankle/foot is feeling better as well.     Social Support/Occupation    Lives in: multiple level home         Occupation: Retired           Precautions: cardiac  osteopenia/osteoporosis  cancer history  hypertension  Allergies: Ciprofloxacin  and Nsaids    Objective                          Information below copied from evaluation as reference information unless noted/dated on the goal grid:    Functional Strength: (01/23/2024)    Sit to stand transfer: Independent   Heel raise: Bilateral:       x10 p! Heel in standing, p! in seated w/tennis x5        R:              L:   Squat: Partial   (blank fields were intentionally left blank)     Balance (secs): Initial R Initial L 02/29/24    SLS (eyes open) 30 8 R  30  L 30   Tandem                 (blank fields were intentionally left blank)     Initial  Right  AROM Initial  Left AROM Ankle/LE ROM (degrees)  5/7 R AROM   4 12 Dorsiflexion  8   48 50 Plantarflexion     28 32 Inversion     20 20 Eversion     136 138 Knee Flexion     0 0 Knee Extension     (blank fields were intentionally left blank)    Initial  R Initial  L LE Strength  MMT: /5  02/29/24 R    4- p! 5 Ankle Dorsiflexion  4    4 5  Ankle Plantarflexion  4+   4- 4+ Ankle Inversion  4   4- 4+ Ankle Eversion  4   4- 4 Quadriceps     4 4+ Hamstrings     3+ 3+ Hip Abduction     (blank fields  were intentionally left blank)               Treatment     Therapeutic Exercises - Justified to address any of the following:  To develop strength, endurance, ROM and/or flexibility.   Active warm-up on RB to promote blood flow and increase tissue extensibility while subjective history obtained to guide treatment session today.     See flowsheet for parameters: vc  for setup and sequence  - Standing gastroc stretch at standing wedge  - Ankle 3-way continued with RTB for strengthening  - standing heel raises for gastroc strength, progressed to 2 step  +side steps with YTB abv knee for glute strength       Updated objective measures and goals for goal progression.     Neuromuscular Re-Education - Justified to address any of the following:   Re-education of movement, balance, coordination, kinesthetic sense, posture and/or proprioception for sitting and/or standing activities.   For foot intrinsic activation:  - standing arch lifts, vcs to inc 3 point contact  - standing toe yoga, vcs for great toe isolation/FHB isolation    - SLS vectors on airex for balance and foot/ankle activation    + wobble board lvl 1 CW/CCW    + standing marches with YTB on forefeet - cues to maintain DF    +lat step down 4 to inc glute med activation, unilat UE assist req. Progression to 5# KB in opp UE to mimic carrying grandchild down the stairs.     ---      Flowsheet Row ---   Total Time    Timed Minutes 41 minutes   Total Time 41 minutes             Assessment      Pt presents to 5th f/u for R foot/ankle pain. Practiced stair negotiation going down with good improvement per pt request, pt able to negotiate stairs without pain, however weighted descent with carrying grandchild still painful. Able to tolerate 5# KB with no report of pain. Good tolerance to all other foot intrinsic and hip strengthening exercises. Continue to improve symptoms with going down stairs and carrying her grandchild.   Plan   Cont per POC  Practice weighted lat  step down with KB  Cont CKC strengthening         Goals      Goal 1: STG:  Patient will improve DF to 15 deg in order to stand for 30 min without heel pain.     5/7: DF progressed to 8 degrees. Notes standing for cooking will have to take sitting breaks often. Has not been able to stand 30 minutes. - MD    02/29/24: progressing, able to stand for 20 min while cooking , 30 min is still too much but able to do it every once in a while- IH   Sessions: 8      Goal 2:  Patient will demonstrate SLS to 30 sec in order to negotiate stairs without compensation or heel pain.     02/29/24: progressing, pt can walk full speed, about 30-40 min , walking for an hour pace is slower, going up and down the stairs pain free- IH   Sessions: 20      Goal 3: Patient will demonstrate global ankle strength to 4+/5 MMT in order to carry her grandchild (14 months) on the stairs and performing stair negotiation without heel pain.     02/29/24: progressing, still feels the pain on the heel on the stairs with carrying her grandchild - IH   Sessions: 20      Goal 4: Patient will demonstrate independence in prescribed HEP with proper form, sets and reps for safe discharge to an independent program.     Sessions: 20  Glady JINNY Caddy, DPT       Exercise Flow Sheet    Precautions:   --     MD Follow-up:  --  Needs POC signed in 30 days from eval 4/22/2025or UPOC (Medicare or UHC advantage)       Exercise Specifics   Visit #2   Visit #3   Visit #4   Visit #5   Visit #6   Visit #7   Visit #8   Visit #9   Visit #10   RB    5' 5' 5' 5' 5'  FOTO  PN / FOTO   Ankle 4 way     3-way YTB 30x 3-way YTB 30x 3-way RTB 30x 3-way RTB 30x Lat step down 4 x10 ea  + 5# KB in opp UE  X8 ea       Arch lifts     x1' x1' x1 Standing x1' Standing x1'       Toe yoga     x1' 5x20 5x20 5x20 5x1'       Standing gastroc/soleus     3x30 3x30 3x30 3x30 2x30       Seated heel raises   Modified height Standing 2x10 Standing 2x10  Standing 2x10 Standing 2x10 Standing 2   2x10       Gentle DF self mob    2x10 red power band A-P glide with 12 inch box 5x10 on stool   12 x1' 5x10 on stool   12 x1' 5x10 on stool   12 x1' --            Standing hip abd/ext x10 B  SLS vectors airex 2x10 bil SLS vectors airex 2x10 bil SLS vectors airex 2x10 bil            Side step no resist 3 short laps   Standing marches RTB forefoot 2x10 bil Standing marches YTB forefoot   2x10 bil            Towel scrunches 2x8 p!  D/c - Wobble board CW/CCW lvl 1    5x each Wobble board CW/CCW lvl 1  10x ea             Iso clams 3x30 YTB Iso clams 3x30 YTB Time     SS YTB abv knee  3 laps        Home Exercise Program    Reviewed Updated  Updated         (all exercises were completed with therapist supervision unless noted)    (White date box = 2 week treatment reminder)    Patient HEP:  Access Code: [email or web address]  URL: [email or web address]  Date: 02/02/2024  Prepared by: Glady Caddy    Exercises  - Seated Ankle Alphabet  - 2 x daily - 7 x weekly - 1 sets - 26 reps  - Long Sitting Calf Stretch with Strap  - 2 x daily - 7 x weekly - 3 sets - 30 hold  - Seated Arch Lifts  - 2 x daily - 7 x weekly - 2 sets - 10 reps - 3 hold  - Toe Yoga - Alternating Great Toe and Lesser Toe Extension  - 2 x daily - 7 x weekly - 2 sets - 10 reps - 3 hold  - Standing Ankle Dorsiflexion Stretch on Chair  - 2 x daily - 7 x weekly - 2 sets - 10 reps - 5 hold  - Standing Hip Abduction with Counter Support  -  1 x daily - 7 x weekly - 2 sets - 10 reps  - Standing Hip Extension with Counter Support  - 1 x daily - 7 x weekly - 2 sets - 10 reps    Parts of this note were generated by the Epic EMR system/Dragon speech recognition and may contain inherent errors or omissions not intended by the user. Grammatical errors, random word insertions, deletions, pronoun errors and incomplete sentences are occasional consequences of this technology due to software limitations. Not all errors are caught  or corrected.  If there are questions or concerns about the content of this note or information contained within the body of this dictation they should be addressed directly with the author for clarification.

## 2024-02-29 ENCOUNTER — Inpatient Hospital Stay

## 2024-02-29 DIAGNOSIS — M25571 Pain in right ankle and joints of right foot: Secondary | ICD-10-CM

## 2024-02-29 DIAGNOSIS — M7671 Peroneal tendinitis, right leg: Secondary | ICD-10-CM

## 2024-02-29 DIAGNOSIS — M722 Plantar fascial fibromatosis: Secondary | ICD-10-CM

## 2024-03-04 ENCOUNTER — Inpatient Hospital Stay

## 2024-03-05 ENCOUNTER — Inpatient Hospital Stay: Attending: Medical

## 2024-03-05 DIAGNOSIS — M7671 Peroneal tendinitis, right leg: Secondary | ICD-10-CM | POA: Insufficient documentation

## 2024-03-05 DIAGNOSIS — M25571 Pain in right ankle and joints of right foot: Secondary | ICD-10-CM | POA: Insufficient documentation

## 2024-03-05 DIAGNOSIS — M722 Plantar fascial fibromatosis: Secondary | ICD-10-CM | POA: Insufficient documentation

## 2024-03-05 NOTE — PT/OT Therapy Note (Signed)
 Name: Katelyn Caldwell Age: 73 y.o. Date of Service: 03/05/2024  Referring Physician: Lafrances Almarie DEL, PA  Date of Injury: No data found 01/09/2024   Date of Injury: 01/09/2024  Plan of Care Dates From:01/23/2024 To: 04/21/2024  Date Treatment Started: 01/23/2024    Sessions in Plan of Care: 20       Visit Count: 7   Diagnosis:    Diagnosis ICD-10-CM Associated Order   1. Pain in right ankle and joints of right foot  M25.571       2. Peroneal tendonitis, right  M76.71       3. Plantar fasciitis of right foot  M72.2                            Subjective     Daily Subjective   Patient states that her ankle is feeling sore today since she was in the garden yesterday for 2 hours, put a compression sleeve on her ankle last night and that helped her feel better. Has been taking 45 min walks and that's been going okay. States that she did the elliptical for 20 min a couple of times a week and that's been okay. Got to carry her grandchild down the stairs on "Sunday, did half flights so didn't have any pain.     Social Support/Occupation    Lives in: multiple level home         Occupation: Retired           Precautions: cardiac  osteopenia/osteoporosis  cancer history  hypertension  Allergies: Ciprofloxacin and Nsaids    Objective                          Information below copied from evaluation as reference information unless noted/dated on the goal grid:    Functional Strength: (01/23/2024)    Sit to stand transfer: Independent   Heel raise: Bilateral:       x10 p! Heel in standing, p! in seated w/tennis x5        R:              L:   Squat: Partial   (blank fields were intentionally left blank)     Balance (secs): Initial R Initial L 02/29/24    SLS (eyes open) 30 8 R  30  L 30   Tandem                 (blank fields were intentionally left blank)     Initial  Right  AROM Initial  Left AROM Ankle/LE ROM (degrees)  5/7 R AROM   4 12 Dorsiflexion  8   48 50 Plantarflexion     28 32 Inversion     20 20 Eversion     13" 6 138 Knee  Flexion     0 0 Knee Extension     (blank fields were intentionally left blank)    Initial  R Initial  L LE Strength  MMT: /5  02/29/24 R    4- p! 5 Ankle Dorsiflexion  4    4 5  Ankle Plantarflexion  4+   4- 4+ Ankle Inversion  4   4- 4+ Ankle Eversion  4   4- 4 Quadriceps     4 4+ Hamstrings     3+ 3+ Hip Abduction     (blank fields were intentionally left blank)  Treatment     Therapeutic Exercises - Justified to address any of the following:  To develop strength, endurance, ROM and/or flexibility.   Active warm-up on RB to promote blood flow and increase tissue extensibility while subjective history obtained to guide treatment session today.     See flowsheet for parameters: vc for setup and sequence  - Standing gastroc stretch at standing wedge  - Ankle 3-way continued with RTB for strengthening  - standing heel raises for gastroc strength, progressed to 4 step  +side steps with YTB abv ank for glute strength     Neuromuscular Re-Education - Justified to address any of the following:   Re-education of movement, balance, coordination, kinesthetic sense, posture and/or proprioception for sitting and/or standing activities.   - SLS vectors on airex for balance and foot/ankle activation    + DL rockerboard AP/ML for inc static stance on uneven surface to inc challenge    +lat step down 4 to inc glute med activation, unilat UE assist req. Progression to 5# KB in opp UE to mimic carrying grandchild down the stairs. Progressed to 10# KB     Discussion of functional goals and remaining deficits.     ---      Flowsheet Row ---   Total Time    Timed Minutes 38 minutes   Total Time 38 minutes               Assessment      Pt presents to 6th f/u for R foot/ankle pain.continued practice of stair negotiation with emphasis on step downs. Able to progress to 10#KB before pt felt discomfort in the peroneals, able to complete 10 reps. Positive projectory of sx with pt improving functional tolerance. Pt quickly  fatigued with SLS vector on airex still showing balance challenges and stability. Continue to improve stability for stair negotiation to carry her grandchild down the stairs without pain.     Pt requested 8' early dismissal, time adjusted accordingly.   Plan   Cont per POC  Practice weighted lat step down with KB  Cont CKC strengthening   Practice golf swing      Goals      Goal 1: STG:  Patient will improve DF to 15 deg in order to stand for 30 min without heel pain.     5/7: DF progressed to 8 degrees. Notes standing for cooking will have to take sitting breaks often. Has not been able to stand 30 minutes. - MD    02/29/24: progressing, able to stand for 20 min while cooking , 30 min is still too much but able to do it every once in a while- IH   Sessions: 8      Goal 2:  Patient will demonstrate SLS to 30 sec in order to negotiate stairs without compensation or heel pain.     02/29/24: progressing, pt can walk full speed, about 30-40 min , walking for an hour pace is slower, going up and down the stairs pain free- IH   Sessions: 20      Goal 3: Patient will demonstrate global ankle strength to 4+/5 MMT in order to carry her grandchild (14 months) on the stairs and performing stair negotiation without heel pain.     02/29/24: progressing, still feels the pain on the heel on the stairs with carrying her grandchild - IH   Sessions: 20      Goal 4: Patient will demonstrate independence in prescribed HEP with proper form,  sets and reps for safe discharge to an independent program.     Sessions: 20                                              Anuhea Gassner JINNY Caddy, DPT       Exercise Flow Sheet    Precautions:   --     MD Follow-up:  --  Needs POC signed in 30 days from eval 4/22/2025or UPOC (Medicare or UHC advantage)       Exercise Specifics   Visit #2   Visit #3   Visit #4   Visit #5   Visit #6   Visit #7   Visit #8   Visit #9   Visit #10   RB    5' 5' 5' 5' 5' 5' FOTO  PN / FOTO   Ankle 4 way     3-way YTB 30x 3-way YTB 30x  3-way RTB 30x 3-way RTB 30x Lat step down 4 x10 ea  + 5# KB in opp UE  X8 ea Lat step down 4 x10 ea  + 5# KB in opp UE  X10ea  10#x10ea      Arch lifts     x1' x1' x1 Standing x1' Standing x1' --      Toe yoga     x1' 5x20 5x20 5x20 5x1' --      Standing gastroc/soleus     3x30 3x30 3x30 3x30 2x30 3x30      Seated heel raises   Modified height Standing 2x10 Standing 2x10 Standing 2x10 Standing 2x10 Standing 2   2x10 Standing 4 2x10      Gentle DF self mob    2x10 red power band A-P glide with 12 inch box 5x10 on stool   12 x1' 5x10 on stool   12 x1' 5x10 on stool   12 x1' --            Standing hip abd/ext x10 B  SLS vectors airex 2x10 bil SLS vectors airex 2x10 bil SLS vectors airex 2x10 bil SLS vectors airex x10 bil           Side step no resist 3 short laps   Standing marches RTB forefoot 2x10 bil Standing marches YTB forefoot   2x10 bil -->           Towel scrunches 2x8 p!  D/c - Wobble board CW/CCW lvl 1    5x each Wobble board CW/CCW lvl 1  10x ea Rockerboard DL AP/ML   [id number] ea            Iso clams 3x30 YTB Iso clams 3x30 YTB Time     SS YTB abv knee  3 laps  SS YTB abv ank   3 laps       Home Exercise Program    Reviewed Updated  Updated         (all exercises were completed with therapist supervision unless noted)    (White date box = 2 week treatment reminder)    Patient HEP:  Access Code: [email or web address]  URL: [email or web address]  Date: 02/02/2024  Prepared by: Glady Caddy    Exercises  - Seated Ankle Alphabet  - 2 x daily - 7 x weekly - 1 sets - 26 reps  - Long Sitting Calf Stretch with Strap  - 2 x daily -  7 x weekly - 3 sets - 30 hold  - Seated Arch Lifts  - 2 x daily - 7 x weekly - 2 sets - 10 reps - 3 hold  - Toe Yoga - Alternating Great Toe and Lesser Toe Extension  - 2 x daily - 7 x weekly - 2 sets - 10 reps - 3 hold  - Standing Ankle Dorsiflexion Stretch on Chair  - 2 x daily - 7 x weekly - 2 sets - 10 reps - 5 hold  - Standing Hip Abduction with Counter Support  - 1  x daily - 7 x weekly - 2 sets - 10 reps  - Standing Hip Extension with Counter Support  - 1 x daily - 7 x weekly - 2 sets - 10 reps    Parts of this note were generated by the Epic EMR system/Dragon speech recognition and may contain inherent errors or omissions not intended by the user. Grammatical errors, random word insertions, deletions, pronoun errors and incomplete sentences are occasional consequences of this technology due to software limitations. Not all errors are caught or corrected.  If there are questions or concerns about the content of this note or information contained within the body of this dictation they should be addressed directly with the author for clarification.

## 2024-03-07 NOTE — Progress Notes (Signed)
 [facility] Breast Cancer Follow Up    Treatment Team  Ref Prov:    Primary Care Physician:   Dalphine Glendia HERO, MD Radiology facility: [facility])--[phone] Breast Surgeon : Abigail Lemma MD  [phone]   Plastic Surgeon none Radiation Onc:  Elora Harrison MD:  [phone] [facility] [place]); Medical Onc:  Rollo Danish, MD [phone] [facility] [place])        Breast Cancer Comprehensive Care Plan Summary  Date of diagnosis: 11/03/2018 Event : DCIS ECOG Performance: Grade 0  (Fully active) Genetic Testing :not indicated   Presentation : Abnormal mammogram with microcalcifications Side: left Focality :  unifocal Location: radian 12:00   Biologic Tumor characteristics  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 Ki-67:  DCIS - not indicated Oncotype Dx:  DCIS , not indicated   Estrogen Receptor: positive 19 % Progesterone Receptor: negative Her-2-neu Receptor: DCIS, not indicated Androgen Receptor;  not done   Staging  Tumor Size:   DCIS with <63mm microinvasion Nodes: clinically/US  negative Systemic Metastases: clinically negative Stage: Stage0 , Tmic N0 M0   Treatment  Modality Date     Surgery    12/25/18     01/15/2019 left, partial mastectomy with needle localization     Re-excision lumpectomy    Radiation    Whole breast radiation completed on 04/02/2019   Endocrine    not felt to be warranted as the patient was only weakly ER positive   Chemotherapy       Biologic       Clinical trial            HPI   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 female patient here for follow up of her history of left breast cancer originally diagnosed in February 2020.  She is currently on No endocrine therapy    The patient is asymptomatic and denies any persistent breast pain, palpable masses, skin or nipple changes or nipple discharge.     Her most recent imaging on 01/03/2024 shows no suspicious findings.     She reports no change in her  family history since her last visit.    Recent Imaging:  LDM 07/18/19: No mammographic evidence for malignancy. BIRADS2    BDM 11/06/19: Left lumpectomy changes with No mammographic evidence for malignancy. BIRADS2    BDM 11/06/20: No mammographic evidence for malignancy. BIRADS2    BDM 11/24/21: No mammographic evidence for malignancy. BIRADS2    BDM 12/09/2022: No mammographic evidence for malignancy.  BIRADS2    BSM 01/03/2024:  No mammographic evidence for malignancy.  BIRADS2    The following portions of the patient's history were reviewed and updated as appropriate: allergies, current medications, past family history, past medical history, past social history, past surgical history and problem list.     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    Family History     Family History   Problem Relation Age of Onset    Diabetes Mother     Thyroid  disease Mother     Heart disease  Mother     Hyperlipidemia Father         Review of Systems   See scan.     Physical Exam   BP 113/68 (BP Site: Left arm, Patient Position: Sitting, Cuff Size: Medium)   Pulse 63   Temp 97.5 F (36.4 C) (Oral)   Ht 1.626 m (5' 4)   Wt 63.2 kg (139 lb 6.4 oz)   SpO2 97%   BMI 23.93 kg/m   WDWN female in NAD  HEENT:  Clear, no scleral icterus, neck supple  Neck:  No thyromegaly or masses, trachea midline  Chest:  Respiratory effort normal  BJM:  Gait and station normal  Ext:  No CCE  Skin:  Free of significant ulcers or lesions  Neuro:  Grossly intact, no focal findings, alert and oriented, asks appropriate questions  LN:  No axillary, supraclavicular or cervical adenopathy  Breast:  Symmetric .    Right breast: No skin or nipple changes, no nipple retraction or discharge . No palpable masses    Left breast : No skin or nipple changes, no nipple retraction or discharge . No palpable masses. Well healed incision.    Rads   Radiological imaging and reports reviewed as above.     Assessment/Plan   Personal history of breast carcinoma    Patient is doing well. She currently has no evidence of disease .  Her physical and emotional recovery is good. Cosmetic outcome is good .  I have recommended continued treatment with Serial imaging.    Surveillance / Folllow up recommendations:    I reviewed the importance of breast exams and follow up at six month intervals for the first five years after the first treatment; alternating exams at 6 month intervals with medical oncology is a good option between years three and five to limit the number of times needed for follow up ; and every year thereafter.  I have recommended long term follow-up because there is a low but measurable risk of breast cancer recurrence even more than 10 years after treatment. In addition, if  there is remaining breast tissue, there is a higher risk to develop a new, independent, breast cancer at some time in the future.    After lumpectomy, we  recommend mammograms once a year. The purpose is to monitor for local recurrence and detection of new primary.    Patient is advised to report these symptoms : new lumps, bone pain, chest pain, shortness of breath or difficulty breathing, abdominal pain, or persistent headaches.     The following tests are not recommended for routine breast cancer follow-up: breast MRI, FDG-PET scans, complete blood cell counts, automated chemistry studies, chest x-rays, bone scans, liver ultrasound, and tumor markers (CA 15-3, CA 27.29, CEA).     Nutrition  I have also discussed life-style choices to decrease risk of breast cancer such as:   Healthy eating habits, meals high in fruits, vegetables, nuts and complex carbohydrates, low in animal fats.  Limit the amount of alcohol ingested.  Avoid  Hormone Replacement Therapy.    Exercise  Continue to exercise, aerobic 30 - 40 minutes ,  3 -4 times / week.    Follow up  Continue healthy lifestyle.   Bilateral mammogram in April 2026.  Follow up with PCP for routine health maintenance. She also plans to follow with them for breast cancer screening.   Follow up PRN.   Patient agrees with plan.

## 2024-03-11 NOTE — PT/OT Therapy Note (Signed)
 Name: Katelyn Caldwell Age: 73 y.o. Date of Service: 03/12/2024  Referring Physician: Lafrances Almarie DEL, PA  Date of Injury: No data found 01/09/2024   Date of Injury: 01/09/2024  Plan of Care Dates From:01/23/2024 To: 04/21/2024  Date Treatment Started: 01/23/2024    Sessions in Plan of Care: 20       Visit Count: 8   Diagnosis:    Diagnosis ICD-10-CM Associated Order   1. Pain in right ankle and joints of right foot  M25.571       2. Peroneal tendonitis, right  M76.71       3. Plantar fasciitis of right foot  M72.2                              Subjective     Daily Subjective   Pt states that she walked for 45 min and had some pain with that, R ankle feeling sore today. Doesn't think she can play golf with only being able to walk for 45 min.    Social Support/Occupation    Lives in: multiple level home         Occupation: Retired           Precautions: cardiac  osteopenia/osteoporosis  cancer history  hypertension  Allergies: Ciprofloxacin  and Nsaids    Objective                          Information below copied from evaluation as reference information unless noted/dated on the goal grid:    Functional Strength: (01/23/2024)    Sit to stand transfer: Independent   Heel raise: Bilateral:       x10 p! Heel in standing, p! in seated w/tennis x5        R:              L:   Squat: Partial   (blank fields were intentionally left blank)     Balance (secs): Initial R Initial L 02/29/24  03/12/24   SLS (eyes open) 30 8 R  30  L 30    Tandem                   (blank fields were intentionally left blank)     Initial  Right  AROM Initial  Left AROM Ankle/LE ROM (degrees)  5/7 R AROM   4 12 Dorsiflexion  8   48 50 Plantarflexion     28 32 Inversion     20 20 Eversion     136 138 Knee Flexion     0 0 Knee Extension     (blank fields were intentionally left blank)    Initial  R Initial  L LE Strength  MMT: /5  02/29/24 R  03/12/24 R   4- p! 5 Ankle Dorsiflexion  4  5   4 5  Ankle Plantarflexion  4+ 5-   4- 4+ Ankle Inversion  4    4- 4+ Ankle  Eversion  4 4+   4- 4 Quadriceps      4 4+ Hamstrings      3+ 3+ Hip Abduction      (blank fields were intentionally left blank)               Treatment     Therapeutic Exercises - Justified to address any of the following:  To develop strength, endurance, ROM and/or flexibility.  Active warm-up on RB to promote blood flow and increase tissue extensibility while subjective history obtained to guide treatment session today.     See flowsheet for parameters: vc for setup and sequence  - standing heel raises for gastroc strength, progressed to 4 step progressed to SL heel raise  +side steps with GTB for glute med strength near counter  +gastroc/soleus stretch after warm up  +plantar fascia stretch at end of session near wall     Neuromuscular Re-Education - Justified to address any of the following:   Re-education of movement, balance, coordination, kinesthetic sense, posture and/or proprioception for sitting and/or standing activities.   + DL rockerboard AP/ML for inc static stance on uneven surface to inc challenge, inc duration     +lat step down 4 to inc glute med activation, unilat UE assist req. Cont w/ 10# KB in opp UE to mimic carrying grandchild down the stairs. P! Today    T stance to improve SL stability, unilat UE on counter to improve glut/ankle stabilizers.     +step up 6 with SL hold on RLE before return to start     Assessed balance and updated goals for goal progression.       ---      Flowsheet Row ---   Total Time    Timed Minutes 44 minutes   Total Time 44 minutes                 Assessment      Pt presents to 7th f/u for R foot/ankle pain. Continued progressive SL strengthening and improving functional deficits, decreased glute stability with T stance. Pt showed good toleration during first set of SL heel raises, however felt ant joint line pain during second set. Increased soreness pt already had prior to starting session toay increased with lateral step down, unable to complete. Modifications  discussed with intensity and decreasing ambulation speed to walk for 1 hour vs 45 , pt agreeable. Encouraged pt of self massage to decrease recurring pain.    Continue to improve stability to improve ambulation > 45 min and return to golf without pain.       Plan   Cont per POC  Practice weighted lat step down with KB  Cont CKC strengthening   Practice golf swing,   F/u with duration on community walking for 1 hour       Goals      Goal 1: STG:  Patient will improve DF to 15 deg in order to stand for 30 min without heel pain.     5/7: DF progressed to 8 degrees. Notes standing for cooking will have to take sitting breaks often. Has not been able to stand 30 minutes. - MD    02/29/24: progressing, able to stand for 20 min while cooking , 30 min is still too much but able to do it every once in a while- IH   Sessions: 8      Goal 2:  Patient will demonstrate SLS to 30 sec in order to negotiate stairs without compensation or heel pain.     02/29/24: progressing, pt can walk full speed, about 30-40 min , walking for an hour pace is slower, going up and down the stairs pain free- IH   Sessions: 20      Goal 3: Patient will demonstrate global ankle strength to 4+/5 MMT in order to carry her grandchild (14 months) on the stairs and performing stair negotiation without heel pain.  02/29/24: progressing, still feels the pain on the heel on the stairs with carrying her grandchild - [facility]    03/12/24: improving ankle strength, less discomfort with carrying her grandchild going up and down the stairs, carrying her grandchild less - IH   Sessions: 20      Goal 4: Patient will demonstrate independence in prescribed HEP with proper form, sets and reps for safe discharge to an independent program.     Sessions: 20                                                Glady JINNY Caddy, DPT       Exercise Flow Sheet    Precautions:   --     MD Follow-up:  --  Needs POC signed in 30 days from eval 4/22/2025or UPOC (Medicare or [employer] advantage)        Exercise Specifics   Visit #2   Visit #3   Visit #4   Visit #5   Visit #6   Visit #7   Visit #8   Visit #9   Visit #10   RB    5' 5' 5' 5' 5' 5' 5'  PN / FOTO   Ankle 4 way     3-way YTB 30x 3-way YTB 30x 3-way RTB 30x 3-way RTB 30x Lat step down 4 x10 ea  + 5# KB in opp UE  X8 ea Lat step down 4 x10 ea  + 5# KB in opp UE  X10ea  10#x10ea Lat step down 4 10# KB   X10 B      Arch lifts     x1' x1' x1 Standing x1' Standing x1' -- Step up 6 x15 RLE     Toe yoga     x1' 5x20 5x20 5x20 5x1' -- Plantar fascia stretch   10x10     Standing gastroc/soleus     3x30 3x30 3x30 3x30 2x30 3x30 3x30     Seated heel raises   Modified height Standing 2x10 Standing 2x10 Standing 2x10 Standing 2x10 Standing 2   2x10 Standing 4 2x10 SL HR   X10   X5      Gentle DF self mob    2x10 red power band A-P glide with 12 inch box 5x10 on stool   12 x1' 5x10 on stool   12 x1' 5x10 on stool   12 x1' --  T stance 2x10B          Standing hip abd/ext x10 B  SLS vectors airex 2x10 bil SLS vectors airex 2x10 bil SLS vectors airex 2x10 bil SLS vectors airex x10 bil SLS airex assessed           Side step no resist 3 short laps   Standing marches RTB forefoot 2x10 bil Standing marches YTB forefoot   2x10 bil -->           Towel scrunches 2x8 p!  D/c - Wobble board CW/CCW lvl 1    5x each Wobble board CW/CCW lvl 1  10x ea Rockerboard DL AP/ML   [id number] ea Rockerboard AP/ML 2x1' ea           Iso clams 3x30 YTB Iso clams 3x30 YTB Time     SS YTB abv knee  3 laps  SS YTB abv ank   3 laps  SS GTB ank   4 laps near counter      Home Exercise Program    Reviewed Updated  Updated         (all exercises were completed with therapist supervision unless noted)    (White date box = 2 week treatment reminder)    Patient HEP:  Access Code: [email or web address]  URL: [email or web address]  Date: 02/02/2024  Prepared by: Glady Caddy    Exercises  - Seated Ankle Alphabet  - 2 x daily - 7 x weekly - 1 sets - 26 reps  - Long Sitting Calf  Stretch with Strap  - 2 x daily - 7 x weekly - 3 sets - 30 hold  - Seated Arch Lifts  - 2 x daily - 7 x weekly - 2 sets - 10 reps - 3 hold  - Toe Yoga - Alternating Great Toe and Lesser Toe Extension  - 2 x daily - 7 x weekly - 2 sets - 10 reps - 3 hold  - Standing Ankle Dorsiflexion Stretch on Chair  - 2 x daily - 7 x weekly - 2 sets - 10 reps - 5 hold  - Standing Hip Abduction with Counter Support  - 1 x daily - 7 x weekly - 2 sets - 10 reps  - Standing Hip Extension with Counter Support  - 1 x daily - 7 x weekly - 2 sets - 10 reps    Parts of this note were generated by the Epic EMR system/Dragon speech recognition and may contain inherent errors or omissions not intended by the user. Grammatical errors, random word insertions, deletions, pronoun errors and incomplete sentences are occasional consequences of this technology due to software limitations. Not all errors are caught or corrected.  If there are questions or concerns about the content of this note or information contained within the body of this dictation they should be addressed directly with the author for clarification.

## 2024-03-12 ENCOUNTER — Inpatient Hospital Stay

## 2024-03-12 DIAGNOSIS — M25571 Pain in right ankle and joints of right foot: Secondary | ICD-10-CM

## 2024-03-12 DIAGNOSIS — M7671 Peroneal tendinitis, right leg: Secondary | ICD-10-CM

## 2024-03-12 DIAGNOSIS — M722 Plantar fascial fibromatosis: Secondary | ICD-10-CM

## 2024-03-14 ENCOUNTER — Encounter: Payer: Self-pay | Admitting: Registered Nurse

## 2024-03-14 ENCOUNTER — Ambulatory Visit: Attending: Surgery | Admitting: Registered Nurse

## 2024-03-14 ENCOUNTER — Ambulatory Visit (HOSPITAL_BASED_OUTPATIENT_CLINIC_OR_DEPARTMENT_OTHER): Payer: Medicare Other | Admitting: Registered Nurse

## 2024-03-14 VITALS — BP 113/68 | HR 63 | Temp 97.5°F | Ht 64.0 in | Wt 139.4 lb

## 2024-03-14 DIAGNOSIS — Z853 Personal history of malignant neoplasm of breast: Secondary | ICD-10-CM | POA: Insufficient documentation

## 2024-03-15 ENCOUNTER — Inpatient Hospital Stay

## 2024-03-15 NOTE — PT/OT Therapy Note (Deleted)
 Name: Katelyn Caldwell Age: 73 y.o. Date of Service: 03/15/2024  Referring Physician: Lafrances Almarie DEL, PA  Date of Injury: No data found No data found   Date of Injury: No data found  Plan of Care Dates From:No data found To: No data was found  Date Treatment Started: No data found    Sessions in Plan of Care: No data was found       Visit Count: Visit count could not be calculated. Make sure you are using a visit which is associated with an episode.   Diagnosis:   No diagnosis found.                         Subjective     Daily Subjective   Pt states she     walked for 45 min and had some pain with that, R ankle feeling sore today. Doesn't think she can play golf with only being able to walk for 45 min.    Social Support/Occupation    Lives in: multiple level home         Occupation: Retired           Precautions: No data was found  Allergies: Ciprofloxacin  and Nsaids    Objective                          Information below copied from evaluation as reference information unless noted/dated on the goal grid:    Functional Strength: (01/23/2024)    Sit to stand transfer: Independent   Heel raise: Bilateral:       x10 p! Heel in standing, p! in seated w/tennis x5        R:              L:   Squat: Partial   (blank fields were intentionally left blank)     Balance (secs): Initial R Initial L 02/29/24  03/12/24   SLS (eyes open) 30 8 R  30  L 30    Tandem                   (blank fields were intentionally left blank)     Initial  Right  AROM Initial  Left AROM Ankle/LE ROM (degrees)  5/7 R AROM   4 12 Dorsiflexion  8   48 50 Plantarflexion     28 32 Inversion     20 20 Eversion     136 138 Knee Flexion     0 0 Knee Extension     (blank fields were intentionally left blank)    Initial  R Initial  L LE Strength  MMT: /5  02/29/24 R  03/12/24 R   4- p! 5 Ankle Dorsiflexion  4  5   4 5  Ankle Plantarflexion  4+ 5-   4- 4+ Ankle Inversion  4    4- 4+ Ankle Eversion  4 4+   4- 4 Quadriceps      4 4+ Hamstrings      3+ 3+ Hip  Abduction      (blank fields were intentionally left blank)               Treatment     Therapeutic Exercises - Justified to address any of the following:  To develop strength, endurance, ROM and/or flexibility.   Active warm-up on RB to promote blood flow and increase tissue extensibility while subjective history  obtained to guide treatment session today.     See flowsheet for parameters: vc for setup and sequence  - standing heel raises for gastroc strength, progressed to 4 step SL heel raise  - side steps with GTB for glute med strength near counter  - gastroc/soleus stretch after warm up  - plantar fascia stretch at end of session near wall     Neuromuscular Re-Education - Justified to address any of the following:   Re-education of movement, balance, coordination, kinesthetic sense, posture and/or proprioception for sitting and/or standing activities.   - DL rockerboard AP/ML for inc static stance on uneven surface to inc challenge, inc duration     - lat step down 4 to inc glute med activation, unilat UE assist req. Cont w/ 10# KB in opp UE to mimic carrying grandchild down the stairs. P! Today    - T stance to improve SL stability, unilat UE on counter to improve glut/ankle stabilizers.     + D2 flexion with YTB and hip internal rotation                    Assessment      Pt presents to 8th f/u for R foot/ankle pain. Still difficulty with T stance however no symptoms noted. Minimal symptoms present with lateral step downs today. Added D2 flexion with light resistance and hip IR to work on golf stance, pt noting ***. Continue to improve stability to improve ambulation > 45 min and return to golf without pain.       Plan   Cont per POC  Practice weighted lat step down with KB  Cont CKC strengthening   Practice golf swing  F/u with duration on community walking for 1 hour                           Jonette Ravel, DPT       Exercise Flow Sheet    Precautions:   --     MD Follow-up:  --  Needs POC signed in 30  days from eval [facility] (Medicare or [employer] advantage)       Exercise Specifics   Visit #2   Visit #3   Visit #4   Visit #5   Visit #6   Visit #7   Visit #8   Visit #9   Visit #10   RB    5' 5' 5' 5' 5' 5' 5' 5' PN / FOTO   Ankle 4 way     3-way YTB 30x 3-way YTB 30x 3-way RTB 30x 3-way RTB 30x Lat step down 4 x10 ea  + 5# KB in opp UE  X8 ea Lat step down 4 x10 ea  + 5# KB in opp UE  X10ea  10#x10ea Lat step down 4 10# KB   X10 B  Lat step down 4 10# KB   X10 B     Arch lifts     x1' x1' x1 Standing x1' Standing x1' -- Step up 6 x15 RLE -    Toe yoga     x1' 5x20 5x20 5x20 5x1' -- Plantar fascia stretch   10x10 Plantar fascia stretch   10x10    Standing gastroc/soleus     3x30 3x30 3x30 3x30 2x30 3x30 3x30 3x30    Seated heel raises   Modified height Standing 2x10 Standing 2x10 Standing 2x10 Standing 2x10 Standing 2   2x10 Standing 4 2x10  SL HR   X10   X5  SL HR   X10   X5     Gentle DF self mob    2x10 red power band A-P glide with 12 inch box 5x10 on stool   12 x1' 5x10 on stool   12 x1' 5x10 on stool   12 x1' --  T stance 2x10B T stance 2x10 B         Standing hip abd/ext x10 B  SLS vectors airex 2x10 bil SLS vectors airex 2x10 bil SLS vectors airex 2x10 bil SLS vectors airex x10 bil SLS airex assessed  -         Side step no resist 3 short laps   Standing marches RTB forefoot 2x10 bil Standing marches YTB forefoot   2x10 bil -->  D2 flexion YTB with hip IR 2x10         Towel scrunches 2x8 p!  D/c - Wobble board CW/CCW lvl 1    5x each Wobble board CW/CCW lvl 1  10x ea Rockerboard DL AP/ML   [id number] ea Rockerboard AP/ML 2x1' ea Rockerboard AP/ML 2x1' ea          Iso clams 3x30 YTB Iso clams 3x30 YTB Time     SS YTB abv knee  3 laps  SS YTB abv ank   3 laps  SS GTB ank   4 laps near counter  SS GTB ank   4 laps near counter    Home Exercise Program    Reviewed Updated  Updated         (all exercises were completed with therapist supervision unless noted)    (White date box = 2  week treatment reminder)    Patient HEP:  Access Code: [email or web address]  URL: [email or web address]  Date: 02/02/2024  Prepared by: Glady Caddy    Exercises  - Seated Ankle Alphabet  - 2 x daily - 7 x weekly - 1 sets - 26 reps  - Long Sitting Calf Stretch with Strap  - 2 x daily - 7 x weekly - 3 sets - 30 hold  - Seated Arch Lifts  - 2 x daily - 7 x weekly - 2 sets - 10 reps - 3 hold  - Toe Yoga - Alternating Great Toe and Lesser Toe Extension  - 2 x daily - 7 x weekly - 2 sets - 10 reps - 3 hold  - Standing Ankle Dorsiflexion Stretch on Chair  - 2 x daily - 7 x weekly - 2 sets - 10 reps - 5 hold  - Standing Hip Abduction with Counter Support  - 1 x daily - 7 x weekly - 2 sets - 10 reps  - Standing Hip Extension with Counter Support  - 1 x daily - 7 x weekly - 2 sets - 10 reps    Parts of this note were generated by the Epic EMR system/Dragon speech recognition and may contain inherent errors or omissions not intended by the user. Grammatical errors, random word insertions, deletions, pronoun errors and incomplete sentences are occasional consequences of this technology due to software limitations. Not all errors are caught or corrected.  If there are questions or concerns about the content of this note or information contained within the body of this dictation they should be addressed directly with the author for clarification.

## 2024-03-19 ENCOUNTER — Inpatient Hospital Stay

## 2024-03-19 ENCOUNTER — Encounter (INDEPENDENT_AMBULATORY_CARE_PROVIDER_SITE_OTHER): Payer: Self-pay | Admitting: Family Medicine

## 2024-03-19 ENCOUNTER — Telehealth (INDEPENDENT_AMBULATORY_CARE_PROVIDER_SITE_OTHER): Admitting: Family Medicine

## 2024-03-19 DIAGNOSIS — M7671 Peroneal tendinitis, right leg: Secondary | ICD-10-CM

## 2024-03-19 DIAGNOSIS — K219 Gastro-esophageal reflux disease without esophagitis: Secondary | ICD-10-CM

## 2024-03-19 DIAGNOSIS — M25571 Pain in right ankle and joints of right foot: Secondary | ICD-10-CM

## 2024-03-19 DIAGNOSIS — M722 Plantar fascial fibromatosis: Secondary | ICD-10-CM

## 2024-03-19 DIAGNOSIS — E1122 Type 2 diabetes mellitus with diabetic chronic kidney disease: Secondary | ICD-10-CM

## 2024-03-19 DIAGNOSIS — M3130 Wegener's granulomatosis without renal involvement: Secondary | ICD-10-CM | POA: Insufficient documentation

## 2024-03-19 DIAGNOSIS — N1831 Chronic kidney disease, stage 3a: Secondary | ICD-10-CM

## 2024-03-19 DIAGNOSIS — I10 Essential (primary) hypertension: Secondary | ICD-10-CM

## 2024-03-19 MED ORDER — EMPAGLIFLOZIN 25 MG PO TABS
25.0000 mg | ORAL_TABLET | Freq: Every morning | ORAL | 3 refills | Status: DC
Start: 2024-03-19 — End: 2024-04-17

## 2024-03-19 MED ORDER — PANTOPRAZOLE SODIUM 40 MG PO TBEC
40.0000 mg | DELAYED_RELEASE_TABLET | Freq: Every evening | ORAL | 3 refills | Status: AC
Start: 2024-03-19 — End: ?

## 2024-03-19 MED ORDER — FREESTYLE LITE TEST VI STRP
ORAL_STRIP | 3 refills | Status: AC
Start: 2024-03-19 — End: ?

## 2024-03-19 MED ORDER — MOUNJARO 2.5 MG/0.5ML SC SOAJ
2.5000 mg | SUBCUTANEOUS | 0 refills | Status: DC
Start: 2024-03-19 — End: 2024-04-17

## 2024-03-19 NOTE — Progress Notes (Signed)
 [facility] [place]         Telehealth:  The Patient has given verbal consent for delivery of health care via telehealth.   The patient is located at Home in [place]   The encounter provider is located at their Medical Office in [place]   Epic Video Client was utilized for Real Time/Synchronous Telehealth.   The time spent in medical discussion during this visit was 20 minutes.          Subjective     Chief Complaint   Patient presents with    Diabetes    Gastroesophageal Reflux     History of Present Illness  Kristal Perl is a 73 year old female with diabetes who presents with uncontrolled blood sugar levels after medication changes.    Blood sugar levels have been elevated since switching from metformin  to Jardiance  and Januvia , with morning readings between 160 to 190 mg/dL and higher postprandial levels. Metformin  was discontinued due to severe diarrhea, which resolved after stopping the medication. Despite daily walking, her current regimen is ineffective in controlling blood sugar.    Current medications include Januvia  50 mg twice daily and Jardiance  25 mg daily. She experiences low blood pressure, with a recent reading of 82/59 mmHg, which she finds uncomfortable. She maintains adequate hydration but may drink less when babysitting her granddaughter. Current antihypertensive medications are Lopressor  37.5 mg daily and losartan  25 mg daily. She is scheduled for a cardiology evaluation next week.    No chest pain, shortness of breath, or palpitations. Increased urination is noted, likely related to hyperglycemia and diabetes medications.  Review of Systems    Objective     There were no vitals taken for this visit.  Physical Exam  Physical Exam       Results  LABS  Blood Glucose: 160-190 mg/dL (morning)      Assessment/Plan     Assessment & Plan  Type 2 Diabetes Mellitus  Uncontrolled glucose levels after switching from metformin  to Jardiance  and Januvia . Metformin  discontinued due to  diarrhea. Discussed sulfonylureas, GLP-1 receptor agonists, and insulin . Chose Mounjaro for its benefits and formulary coverage.  - Prescribe Mounjaro 2.5 mg once weekly.  - Discontinue Januvia .  - Continue Jardiance  25 mg daily with new prescription.  - Provide test strips.  - Schedule follow-up in one month to assess response to Mounjaro.    Hypertension  Episodes of hypotension on current regimen. Discussed switching losartan  to lisinopril for dose flexibility and renal protection. Awaiting cardiologist input.  - Discuss potential switch to lisinopril with cardiologist.  - Monitor blood pressure closely.    General Health Maintenance  Engaging in daily physical activity despite foot injury. Aware of hydration importance with current medications and hyperglycemia.  - Encourage continued physical activity as tolerated.  - Maintain adequate hydration.    Follow-up  Plans to see new cardiologist for hypertension evaluation. Will schedule follow-up before vacation to assess Mounjaro response.  - Attend cardiology appointment next week.  - Schedule follow-up appointment before end of July.    Recording duration: 19 minutes    Verbal consent obtained to record this visit.

## 2024-03-19 NOTE — PT/OT Therapy Note (Signed)
 Name: Katelyn Caldwell Age: 73 y.o. Date of Service: 03/19/2024  Referring Physician: Lafrances Almarie DEL, PA  Date of Injury: No data found 01/09/2024   Date of Injury: 01/09/2024  Plan of Care Dates From:01/23/2024 To: 04/21/2024  Date Treatment Started: 01/23/2024    Sessions in Plan of Care: 20       Visit Count: 9   Diagnosis:    Diagnosis ICD-10-CM Associated Order   1. Pain in right ankle and joints of right foot  M25.571       2. Peroneal tendonitis, right  M76.71       3. Plantar fasciitis of right foot  M72.2                                Subjective     Daily Subjective   Pt states she walked for an hour over the weekend which was uncomfortable to do and then sat for a bit after. States when she went to walk again the pain in the bottom middle of the heel was sharp again. Today feeling good with minimal pain.    Social Support/Occupation    Lives in: multiple level home         Occupation: Retired           Precautions: cardiac  osteopenia/osteoporosis  cancer history  hypertension  Allergies: Ciprofloxacin  and Nsaids    Objective                          Information below copied from evaluation as reference information unless noted/dated on the goal grid:    Functional Strength: (01/23/2024)    Sit to stand transfer: Independent   Heel raise: Bilateral:       x10 p! Heel in standing, p! in seated w/tennis x5        R:              L:   Squat: Partial   (blank fields were intentionally left blank)     Balance (secs): Initial R Initial L 02/29/24  03/12/24   SLS (eyes open) 30 8 R  30  L 30    Tandem                   (blank fields were intentionally left blank)     Initial  Right  AROM Initial  Left AROM Ankle/LE ROM (degrees)  5/7 R AROM   4 12 Dorsiflexion  8   48 50 Plantarflexion     28 32 Inversion     20 20 Eversion     136 138 Knee Flexion     0 0 Knee Extension     (blank fields were intentionally left blank)    Initial  R Initial  L LE Strength  MMT: /5  02/29/24 R  03/12/24 R   4- p! 5 Ankle Dorsiflexion  4   5   4 5  Ankle Plantarflexion  4+ 5-   4- 4+ Ankle Inversion  4    4- 4+ Ankle Eversion  4 4+   4- 4 Quadriceps      4 4+ Hamstrings      3+ 3+ Hip Abduction      (blank fields were intentionally left blank)               Treatment     Therapeutic Exercises - Justified  to address any of the following:  To develop strength, endurance, ROM and/or flexibility.   Active warm-up on RB to promote blood flow and increase tissue extensibility while subjective history obtained to guide treatment session today.     See flowsheet for parameters: vc for setup and sequence  - standing heel raises for gastroc strength, 4 step SL heel raise  - side steps with GTB for glute med strength near counter  - gastroc/soleus stretch after warm up  - plantar fascia stretch at end of session near wall    Neuromuscular Re-Education - Justified to address any of the following:   Re-education of movement, balance, coordination, kinesthetic sense, posture and/or proprioception for sitting and/or standing activities.   - DL rockerboard AP/ML for inc static stance on uneven surface to inc challenge, inc duration     - lat step down 4 to inc glute med activation, unilat UE assist req. Cont w/ 10# KB in opp UE to mimic carrying grandchild down the stairs    - T stance to improve SL stability, unilat UE on counter to improve glut/ankle stabilizers.     + SLS anti-rotation press with YTB and hip internal rotation. Then rotations    ---      Flowsheet Row ---   Total Time    Timed Minutes 43 minutes   Total Time 43 minutes                   Assessment      Pt presents to 8th f/u for R foot/ankle pain. Still difficulty with T stance however no symptoms noted. Minimal symptoms present with lateral step downs today. Added SLS with anti-rotation press, then rotations to work on golf stance, pt noting no symptoms however modified to toes down on contralateral LE to maintain balance. Continue to improve stability to improve ambulation > 45 min and return to  golf without pain.   Plan   Cont per POC  Practice weighted lat step down with KB  Cont CKC strengthening   Practice golf swing  F/u with duration on community walking for 1 hour       Goals      Goal 1: STG:  Patient will improve DF to 15 deg in order to stand for 30 min without heel pain.     5/7: DF progressed to 8 degrees. Notes standing for cooking will have to take sitting breaks often. Has not been able to stand 30 minutes. - MD    02/29/24: progressing, able to stand for 20 min while cooking , 30 min is still too much but able to do it every once in a while- IH   Sessions: 8      Goal 2:  Patient will demonstrate SLS to 30 sec in order to negotiate stairs without compensation or heel pain.     02/29/24: progressing, pt can walk full speed, about 30-40 min , walking for an hour pace is slower, going up and down the stairs pain free- IH   Sessions: 20      Goal 3: Patient will demonstrate global ankle strength to 4+/5 MMT in order to carry her grandchild (14 months) on the stairs and performing stair negotiation without heel pain.     02/29/24: progressing, still feels the pain on the heel on the stairs with carrying her grandchild - [facility]    03/12/24: improving ankle strength, less discomfort with carrying her grandchild going up and down the stairs, carrying  her grandchild less - IH   Sessions: 20      Goal 4: Patient will demonstrate independence in prescribed HEP with proper form, sets and reps for safe discharge to an independent program.     Sessions: 20                                                  Jonette Ravel, DPT       Exercise Flow Sheet    Precautions:   --     MD Follow-up:  --  Needs POC signed in 30 days from eval 4/22/2025or UPOC (Medicare or [employer] advantage)       Exercise Specifics   Visit #2   Visit #3   Visit #4   Visit #5   Visit #6   Visit #7   Visit #8   Visit #9   Visit #10   RB    5' 5' 5' 5' 5' 5' 5' 5' PN / FOTO   Ankle 4 way     3-way YTB 30x 3-way YTB 30x 3-way RTB 30x 3-way RTB 30x  Lat step down 4 x10 ea  + 5# KB in opp UE  X8 ea Lat step down 4 x10 ea  + 5# KB in opp UE  X10ea  10#x10ea Lat step down 4 10# KB   X10 B  Lat step down 4 10# KB   X10 B     Arch lifts     x1' x1' x1 Standing x1' Standing x1' -- Step up 6 x15 RLE -    Toe yoga     x1' 5x20 5x20 5x20 5x1' -- Plantar fascia stretch   10x10 Plantar fascia stretch   10x10    Standing gastroc/soleus     3x30 3x30 3x30 3x30 2x30 3x30 3x30 3x30    Seated heel raises   Modified height Standing 2x10 Standing 2x10 Standing 2x10 Standing 2x10 Standing 2   2x10 Standing 4 2x10 SL HR   X10   X5  SL HR   X10   X5     Gentle DF self mob    2x10 red power band A-P glide with 12 inch box 5x10 on stool   12 x1' 5x10 on stool   12 x1' 5x10 on stool   12 x1' --  T stance 2x10B T stance 2x10 B         Standing hip abd/ext x10 B  SLS vectors airex 2x10 bil SLS vectors airex 2x10 bil SLS vectors airex 2x10 bil SLS vectors airex x10 bil SLS airex assessed  -         Side step no resist 3 short laps   Standing marches RTB forefoot 2x10 bil Standing marches YTB forefoot   2x10 bil -->  SLS paloff YTB with hip IR 2x10         Towel scrunches 2x8 p!  D/c - Wobble board CW/CCW lvl 1    5x each Wobble board CW/CCW lvl 1  10x ea Rockerboard DL AP/ML   [id number] ea Rockerboard AP/ML 2x1' ea Rockerboard AP/ML 2x1' ea          Iso clams 3x30 YTB Iso clams 3x30 YTB Time     SS YTB abv knee  3 laps  SS YTB abv ank   3 laps  SS GTB  ank   4 laps near counter  SS GTB ank   4 laps near counter    Home Exercise Program    Reviewed Updated  Updated         (all exercises were completed with therapist supervision unless noted)    (White date box = 2 week treatment reminder)    Patient HEP:  Access Code: [email or web address]  URL: [email or web address]  Date: 02/02/2024  Prepared by: Glady Caddy    Exercises  - Seated Ankle Alphabet  - 2 x daily - 7 x weekly - 1 sets - 26 reps  - Long Sitting Calf Stretch with Strap  - 2 x daily - 7 x weekly - 3  sets - 30 hold  - Seated Arch Lifts  - 2 x daily - 7 x weekly - 2 sets - 10 reps - 3 hold  - Toe Yoga - Alternating Great Toe and Lesser Toe Extension  - 2 x daily - 7 x weekly - 2 sets - 10 reps - 3 hold  - Standing Ankle Dorsiflexion Stretch on Chair  - 2 x daily - 7 x weekly - 2 sets - 10 reps - 5 hold  - Standing Hip Abduction with Counter Support  - 1 x daily - 7 x weekly - 2 sets - 10 reps  - Standing Hip Extension with Counter Support  - 1 x daily - 7 x weekly - 2 sets - 10 reps    Parts of this note were generated by the Epic EMR system/Dragon speech recognition and may contain inherent errors or omissions not intended by the user. Grammatical errors, random word insertions, deletions, pronoun errors and incomplete sentences are occasional consequences of this technology due to software limitations. Not all errors are caught or corrected.  If there are questions or concerns about the content of this note or information contained within the body of this dictation they should be addressed directly with the author for clarification.

## 2024-03-22 ENCOUNTER — Inpatient Hospital Stay

## 2024-03-22 DIAGNOSIS — M722 Plantar fascial fibromatosis: Secondary | ICD-10-CM

## 2024-03-22 DIAGNOSIS — M7671 Peroneal tendinitis, right leg: Secondary | ICD-10-CM

## 2024-03-22 DIAGNOSIS — M25571 Pain in right ankle and joints of right foot: Secondary | ICD-10-CM

## 2024-03-22 NOTE — Progress Notes (Addendum)
 Name: Katelyn Caldwell Age: 73 y.o. Date of Service: 03/22/2024  Referring Physician: Lafrances Almarie DEL, PA  Date of Injury: No data found 01/09/2024   Date of Injury: 01/09/2024  Plan of Care Dates From:01/23/2024 To: 04/21/2024  Date Treatment Started: 01/23/2024     Sessions in Plan of Care: 20     Visit Count: 10   Diagnosis:     Diagnosis ICD-10-CM Associated Order   1. Pain in right ankle and joints of right foot  M25.571         2. Peroneal tendonitis, right  M76.71         3. Plantar fasciitis of right foot  M72.2                     Subjective      Outcome Measure   Tool Used/Details: FOTO  Score: 60  Predicted Functional Outcome: 60     Daily Subjective   Pt states she is feeling a little soreness in the foot but not as much as it used to be. States walking and standing have been improving and can go longer. Going down stairs will still feel the pain but is less now.     Social Support/Occupation     Lives in: multiple level home           Occupation: Retired           Precautions: cardiac  osteopenia/osteoporosis  cancer history  hypertension  Allergies: Ciprofloxacin  and Nsaids     Objective     Information below copied from evaluation as reference information unless noted/dated on the goal grid:     Functional Strength: (01/23/2024)  6/20   Sit to stand transfer: Independent     Heel raise: Bilateral:       x10 p! Heel in standing, p! in seated w/tennis x5        R:              L: R: 25 fatigue  L: 25 fatigue   Squat: Partial     (blank fields were intentionally left blank)     Balance (secs): Initial R Initial L 02/29/24    SLS (eyes open) 30 8 R  30  L 30   Tandem                   (blank fields were intentionally left blank)     Initial  Right  AROM Initial  Left AROM Ankle/LE ROM (degrees)  5/7 R AROM 6/20 R AROM   4 12 Dorsiflexion  8 10   48 50 Plantarflexion       28 32 Inversion       20 20 Eversion       136 138 Knee Flexion       0 0 Knee Extension       (blank fields were intentionally left blank)      Initial  R Initial  L LE Strength  MMT: /5  02/29/24 R  03/12/24 R   4- p! 5 Ankle Dorsiflexion  4  5   4 5  Ankle Plantarflexion  4+ 5-   4- 4+ Ankle Inversion  4     4- 4+ Ankle Eversion  4 4+   4- 4 Quadriceps       4 4+ Hamstrings       3+ 3+ Hip Abduction       (blank fields were intentionally left blank)  Assessment  Pt presents to 9th f/u for R foot/ankle pain. Pt has made improvements in ankle AROM, SL balance, and LE strength since initial eval. She is noting greater ease with walking up to 45 minutes and standing however still difficulty with walking down stairs due to pain. Notes longer distances with walking can inc pain afterwards more than during activity. Continue to improve stability to improve ambulation > 45 min and return to golf without pain.   Plan   Cont per POC  Practice weighted lat step down with KB  Cont CKC strengthening   Practice golf swing  F/u with duration on community walking for 1 hour         Goals       Goal 1: STG:  Patient will improve DF to 15 deg in order to stand for 30 min without heel pain.      6/20: progressed to 10 deg. Function met. - MD   Sessions: 8      Goal 2:  Patient will demonstrate SLS to 30 sec in order to negotiate stairs without compensation or heel pain.      6/20: SLS met, going down stairs increases the pain but is improving. - MD   Sessions: 20      Goal 3: Patient will demonstrate global ankle strength to 4+/5 MMT in order to carry her grandchild (14 months) on the stairs and performing stair negotiation without heel pain.      03/12/24: improving ankle strength, less discomfort with carrying her grandchild going up and down the stairs, carrying her grandchild less - IH   Sessions: 20      Goal 4: Patient will demonstrate independence in prescribed HEP with proper form, sets and reps for safe discharge to an independent program.     6/20: Compliant with HEP and will benefit from progressions in ankle/foot mobility, SL balance and strength. - MD    Sessions: 20                                                            Jonette Ravel, DPT

## 2024-03-22 NOTE — PT/OT Therapy Note (Addendum)
 Name: Katelyn Caldwell Age: 73 y.o. Date of Service: 03/22/2024  Referring Physician: Lafrances Almarie DEL, PA  Date of Injury: No data found 01/09/2024   Date of Injury: 01/09/2024  Plan of Care Dates From:01/23/2024 To: 04/21/2024  Date Treatment Started: 01/23/2024    Sessions in Plan of Care: 20       Visit Count: 10   Diagnosis:    Diagnosis ICD-10-CM Associated Order   1. Pain in right ankle and joints of right foot  M25.571       2. Peroneal tendonitis, right  M76.71       3. Plantar fasciitis of right foot  M72.2                                  Subjective     Outcome Measure   Tool Used/Details: FOTO  Score: 60  Predicted Functional Outcome: 60    Daily Subjective   Pt states she is feeling a little soreness in the foot but not as much as it used to be. States walking and standing have been improving and can go longer. Going down stairs will still feel the pain but is less now.    Social Support/Occupation    Lives in: multiple level home         Occupation: Retired           Precautions: cardiac  osteopenia/osteoporosis  cancer history  hypertension  Allergies: Ciprofloxacin  and Nsaids    Objective                          Information below copied from evaluation as reference information unless noted/dated on the goal grid:    Functional Strength: (01/23/2024)  6/20   Sit to stand transfer: Independent    Heel raise: Bilateral:       x10 p! Heel in standing, p! in seated w/tennis x5        R:              L: R: 25 fatigue  L: 25 fatigue   Squat: Partial    (blank fields were intentionally left blank)     Balance (secs): Initial R Initial L 02/29/24    SLS (eyes open) 30 8 R  30  L 30   Tandem                 (blank fields were intentionally left blank)     Initial  Right  AROM Initial  Left AROM Ankle/LE ROM (degrees)  5/7 R AROM 6/20 R AROM   4 12 Dorsiflexion  8 10   48 50 Plantarflexion      28 32 Inversion      20 20 Eversion      136 138 Knee Flexion      0 0 Knee Extension      (blank fields were intentionally  left blank)    Initial  R Initial  L LE Strength  MMT: /5  02/29/24 R  03/12/24 R   4- p! 5 Ankle Dorsiflexion  4  5   4 5  Ankle Plantarflexion  4+ 5-   4- 4+ Ankle Inversion  4    4- 4+ Ankle Eversion  4 4+   4- 4 Quadriceps      4 4+ Hamstrings      3+ 3+ Hip Abduction      (  blank fields were intentionally left blank)               Treatment     Therapeutic Exercises - Justified to address any of the following:  To develop strength, endurance, ROM and/or flexibility.   Active warm-up on RB to promote blood flow and increase tissue extensibility while subjective history obtained to guide treatment session today.     Time spent assessing obj measures and progress towards goals    See flowsheet for parameters: vc for setup and sequence  - standing heel raises for gastroc strength, 4 step SL heel raise  - side steps with GTB for glute med strength near counter  - gastroc/soleus stretch after warm up  - plantar fascia stretch at end of session near wall    Neuromuscular Re-Education - Justified to address any of the following:   Re-education of movement, balance, coordination, kinesthetic sense, posture and/or proprioception for sitting and/or standing activities.   - DL rockerboard AP/ML for inc static stance on uneven surface to inc challenge, inc duration     - lat step down 4 to inc glute med activation, unilat UE assist req. Cont w/ 10# KB in opp UE to mimic carrying grandchild down the stairs    - T stance to improve SL stability, unilat UE on counter to improve glut/ankle stabilizers.     - SLS anti-rotation press with YTB and hip internal rotation. Then rotations    ---      Flowsheet Row ---   Total Time    Timed Minutes 42 minutes   Total Time 42 minutes                     Assessment      Pt presents to 9th f/u for R foot/ankle pain. Pt has made improvements in ankle AROM, SL balance, and LE strength since initial eval. She is noting greater ease with walking up to 45 minutes and standing however still  difficulty with walking down stairs due to pain. Notes longer distances with walking can inc pain afterwards more than during activity. Continue to improve stability to improve ambulation > 45 min and return to golf without pain.   Plan   Cont per POC  Practice weighted lat step down with KB  Cont CKC strengthening   Practice golf swing  F/u with duration on community walking for 1 hour       Goals      Goal 1: STG:  Patient will improve DF to 15 deg in order to stand for 30 min without heel pain.     6/20: progressed to 10 deg. Function met. - MD   Sessions: 8      Goal 2:  Patient will demonstrate SLS to 30 sec in order to negotiate stairs without compensation or heel pain.     6/20: SLS met, going down stairs increases the pain but is improving. - MD   Sessions: 20      Goal 3: Patient will demonstrate global ankle strength to 4+/5 MMT in order to carry her grandchild (14 months) on the stairs and performing stair negotiation without heel pain.     03/12/24: improving ankle strength, less discomfort with carrying her grandchild going up and down the stairs, carrying her grandchild less - IH   Sessions: 20      Goal 4: Patient will demonstrate independence in prescribed HEP with proper form, sets and reps for safe discharge to  an independent program.    6/20: Compliant with HEP and will benefit from progressions in ankle/foot mobility, SL balance and strength. - MD   Sessions: 20                                                    Jonette Ravel, DPT       Exercise Flow Sheet    Precautions:        MD Follow-up:    Needs POC signed in 30 days from eval 4/22/2025or UPOC (Medicare or [employer] advantage)       Exercise Specifics   Visit #11   Visit #12   Visit #13   Visit #14   Visit #15   Visit #16   Visit #17   Visit #18   Visit #19     Visit #20   RB             PN / FOTO   Gastroc/soleus stretch                Ankle 3 way                HR/TR                SLS                Step down                SS                 Rockerboard                                                                Home Exercise Program                (all exercises were completed with therapist supervision unless noted)    (White date box = 2 week treatment reminder)    Patient HEP:        Exercise Flow Sheet    Precautions:   --     MD Follow-up:  --  Needs POC signed in 30 days from eval 4/22/2025or UPOC (Medicare or UHC advantage)       Exercise Specifics   Visit #2   Visit #3   Visit #4   Visit #5   Visit #6   Visit #7   Visit #8   Visit #9   Visit #10   RB    5' 5' 5' 5' 5' 5' 5' 5' PN / FOTO  5'   Ankle 4 way     3-way YTB 30x 3-way YTB 30x 3-way RTB 30x 3-way RTB 30x Lat step down 4 x10 ea  + 5# KB in opp UE  X8 ea Lat step down 4 x10 ea  + 5# KB in opp UE  X10ea  10#x10ea Lat step down 4 10# KB   X10 B  Lat step down 4 10# KB   X10 B  Lat step down 4 10# KB   X10 B   Arch lifts     x1' x1' x1 Standing  x1' Standing x1' -- Step up 6 x15 RLE - -   Toe yoga     x1' 5x20 5x20 5x20 5x1' -- Plantar fascia stretch   10x10 Plantar fascia stretch   10x10 Plantar fascia stretch   10x10   Standing gastroc/soleus     3x30 3x30 3x30 3x30 2x30 3x30 3x30 3x30 3x30   Seated heel raises   Modified height Standing 2x10 Standing 2x10 Standing 2x10 Standing 2x10 Standing 2   2x10 Standing 4 2x10 SL HR   X10   X5  SL HR   X10   X5  SL HR   X10   X5   Gentle DF self mob    2x10 red power band A-P glide with 12 inch box 5x10 on stool   12 x1' 5x10 on stool   12 x1' 5x10 on stool   12 x1' --  T stance 2x10B T stance 2x10 B T stance 2x10 B        Standing hip abd/ext x10 B  SLS vectors airex 2x10 bil SLS vectors airex 2x10 bil SLS vectors airex 2x10 bil SLS vectors airex x10 bil SLS airex assessed  - -        Side step no resist 3 short laps   Standing marches RTB forefoot 2x10 bil Standing marches YTB forefoot   2x10 bil -->  SLS paloff YTB with hip IR 2x10 SLS paloff YTB with hip IR 2x10        Towel scrunches 2x8 p!  D/c - Wobble board  CW/CCW lvl 1    5x each Wobble board CW/CCW lvl 1  10x ea Rockerboard DL AP/ML   7k69 ea Rockerboard AP/ML 2x1' ea Rockerboard AP/ML 2x1' ea Rockerboard AP/ML 2x1' ea         Iso clams 3x30 YTB Iso clams 3x30 YTB Time     SS YTB abv knee  3 laps  SS YTB abv ank   3 laps  SS GTB ank   4 laps near counter  SS GTB ank   4 laps near counter SS GTB ank   4 laps near counter   Home Exercise Program    Reviewed Updated  Updated         (all exercises were completed with therapist supervision unless noted)    (White date box = 2 week treatment reminder)    Patient HEP:  Access Code: [email or web address]  URL: [email or web address]  Date: 02/02/2024  Prepared by: Glady Caddy    Exercises  - Seated Ankle Alphabet  - 2 x daily - 7 x weekly - 1 sets - 26 reps  - Long Sitting Calf Stretch with Strap  - 2 x daily - 7 x weekly - 3 sets - 30 hold  - Seated Arch Lifts  - 2 x daily - 7 x weekly - 2 sets - 10 reps - 3 hold  - Toe Yoga - Alternating Great Toe and Lesser Toe Extension  - 2 x daily - 7 x weekly - 2 sets - 10 reps - 3 hold  - Standing Ankle Dorsiflexion Stretch on Chair  - 2 x daily - 7 x weekly - 2 sets - 10 reps - 5 hold  - Standing Hip Abduction with Counter Support  - 1 x daily - 7 x weekly - 2 sets - 10 reps  - Standing Hip Extension with Counter Support  - 1 x daily - 7 x weekly - 2 sets - 10 reps  Parts of this note were generated by the Epic EMR system/Dragon speech recognition and may contain inherent errors or omissions not intended by the user. Grammatical errors, random word insertions, deletions, pronoun errors and incomplete sentences are occasional consequences of this technology due to software limitations. Not all errors are caught or corrected.  If there are questions or concerns about the content of this note or information contained within the body of this dictation they should be addressed directly with the author for clarification.

## 2024-03-24 ENCOUNTER — Encounter (INDEPENDENT_AMBULATORY_CARE_PROVIDER_SITE_OTHER): Payer: Self-pay | Admitting: Family Medicine

## 2024-03-26 ENCOUNTER — Inpatient Hospital Stay

## 2024-03-26 DIAGNOSIS — M7671 Peroneal tendinitis, right leg: Secondary | ICD-10-CM

## 2024-03-26 DIAGNOSIS — M722 Plantar fascial fibromatosis: Secondary | ICD-10-CM

## 2024-03-26 DIAGNOSIS — M25571 Pain in right ankle and joints of right foot: Secondary | ICD-10-CM

## 2024-03-26 NOTE — PT/OT Therapy Note (Signed)
 Name: Katelyn Caldwell Age: 73 y.o. Date of Service: 03/26/2024  Referring Physician: Lafrances Almarie DEL, PA  Date of Injury: No data found 01/09/2024   Date of Injury: 01/09/2024  Plan of Care Dates From:01/23/2024 To: 04/21/2024  Date Treatment Started: 01/23/2024    Sessions in Plan of Care: 20       Visit Count: 11   Diagnosis:    Diagnosis ICD-10-CM Associated Order   1. Pain in right ankle and joints of right foot  M25.571       2. Peroneal tendonitis, right  M76.71       3. Plantar fasciitis of right foot  M72.2                                    Subjective     Daily Subjective   Pt states she has been on her feet a lot the past couple days and feeling a little discomfort in the foot, but overall has been much more manageable. Has been doing walks in the morning due to the heat.    Social Support/Occupation    Lives in: multiple level home         Occupation: Retired           Precautions: cardiac  osteopenia/osteoporosis  cancer history  hypertension  Allergies: Ciprofloxacin  and Nsaids    Objective                          Information below copied from evaluation as reference information unless noted/dated on the goal grid:    Functional Strength: (01/23/2024)  6/20   Sit to stand transfer: Independent    Heel raise: Bilateral:       x10 p! Heel in standing, p! in seated w/tennis x5        R:              L: R: 25 fatigue  L: 25 fatigue   Squat: Partial    (blank fields were intentionally left blank)     Balance (secs): Initial R Initial L 02/29/24    SLS (eyes open) 30 8 R  30  L 30   Tandem                 (blank fields were intentionally left blank)     Initial  Right  AROM Initial  Left AROM Ankle/LE ROM (degrees)  5/7 R AROM 6/20 R AROM   4 12 Dorsiflexion  8 10   48 50 Plantarflexion      28 32 Inversion      20 20 Eversion      136 138 Knee Flexion      0 0 Knee Extension      (blank fields were intentionally left blank)    Initial  R Initial  L LE Strength  MMT: /5  02/29/24 R  03/12/24 R   4- p! 5 Ankle  Dorsiflexion  4  5   4 5  Ankle Plantarflexion  4+ 5-   4- 4+ Ankle Inversion  4    4- 4+ Ankle Eversion  4 4+   4- 4 Quadriceps      4 4+ Hamstrings      3+ 3+ Hip Abduction      (blank fields were intentionally left blank)  Treatment     Therapeutic Exercises - Justified to address any of the following:  To develop strength, endurance, ROM and/or flexibility.   Active warm-up on RB to promote blood flow and increase tissue extensibility while subjective history obtained to guide treatment session today.     See flowsheet for parameters: vc for setup and sequence  - 2 step SL heel raise for gastroc strength  + wall squat with heel raises  - gastroc/soleus stretch after warm up  - plantar fascia stretch at end of session near wall    Neuromuscular Re-Education - Justified to address any of the following:   Re-education of movement, balance, coordination, kinesthetic sense, posture and/or proprioception for sitting and/or standing activities.   - SL rockerboard diagonal for inc static stance on uneven surface to inc challenge    + mod side plank with hip abductions    - Forward T to runner march to improve SL stability, unilat UE on counter to improve glut/ankle stabilizers.     - SLS anti-rotation press with RTB and hip internal rotation. Then rotations    ---      Flowsheet Row ---   Total Time    Timed Minutes 42 minutes   Total Time 42 minutes                       Assessment      Pt presents to 10th f/u for R foot/ankle pain. Pt progressing well, noting fatigue in calf with SL heel raise off step. UE assist with T to runner march and lateral step down due to balance > pain. Inc knee pain and heel following wall squat heel raises which reduced with resting. Mild L hip pain/soreness following modified side planks with hip abduction. Continue to improve stability to improve ambulation > 45 min and return to golf without pain.   Plan   Cont per POC - d/c next visit - pt aware  Cont CKC strengthening    Practice golf swing  F/u with duration on community walking for 1 hour       Goals      Goal 1: STG:  Patient will improve DF to 15 deg in order to stand for 30 min without heel pain.     6/20: progressed to 10 deg. Function met. - MD   Sessions: 8      Goal 2:  Patient will demonstrate SLS to 30 sec in order to negotiate stairs without compensation or heel pain.     6/20: SLS met, going down stairs increases the pain but is improving. - MD   Sessions: 20      Goal 3: Patient will demonstrate global ankle strength to 4+/5 MMT in order to carry her grandchild (14 months) on the stairs and performing stair negotiation without heel pain.     03/12/24: improving ankle strength, less discomfort with carrying her grandchild going up and down the stairs, carrying her grandchild less - IH   Sessions: 20      Goal 4: Patient will demonstrate independence in prescribed HEP with proper form, sets and reps for safe discharge to an independent program.    6/20: Compliant with HEP and will benefit from progressions in ankle/foot mobility, SL balance and strength. - MD   Sessions: 83  Jonette Ravel, DPT       Exercise Flow Sheet    Precautions:        MD Follow-up:    Needs POC signed in 30 days from eval 4/22/2025or [facility] (Medicare or [employer] advantage)       Exercise Specifics   Visit #11   Visit #12   Visit #13   Visit #14   Visit #15   Visit #16   Visit #17   Visit #18   Visit #19     Visit #20   RB    5'         PN / FOTO   Gastroc/soleus stretch    3x30            Ankle 3 way    -            HR/TR    SL HR 2 step 2x10            SLS    Anti rotation press YTB x10 bil    Rotations DL k89            Step down    -            SS    -            Rockerboard    diagonal single leg x1' bil                Wall squat heel raise 3x10 bil                Fwd T to runner march 10# KB                Mod side plank w/ hip abd x10 bil            Home Exercise Program                (all  exercises were completed with therapist supervision unless noted)    (White date box = 2 week treatment reminder)    Patient HEP:        Exercise Flow Sheet    Precautions:   --     MD Follow-up:  --  Needs POC signed in 30 days from eval 4/22/2025or [facility] (Medicare or [employer] advantage)       Exercise Specifics   Visit #2   Visit #3   Visit #4   Visit #5   Visit #6   Visit #7   Visit #8   Visit #9   Visit #10   RB    5' 5' 5' 5' 5' 5' 5' 5' PN / FOTO  5'   Ankle 4 way     3-way YTB 30x 3-way YTB 30x 3-way RTB 30x 3-way RTB 30x Lat step down 4 x10 ea  + 5# KB in opp UE  X8 ea Lat step down 4 x10 ea  + 5# KB in opp UE  X10ea  10#x10ea Lat step down 4 10# KB   X10 B  Lat step down 4 10# KB   X10 B  Lat step down 4 10# KB   X10 B   Arch lifts     x1' x1' x1 Standing x1' Standing x1' -- Step up 6 x15 RLE - -   Toe yoga     x1' 5x20 5x20 5x20 5x1' -- Plantar fascia stretch   10x10 Plantar fascia stretch   10x10 Plantar fascia stretch  10x10   Standing gastroc/soleus     3x30 3x30 3x30 3x30 2x30 3x30 3x30 3x30 3x30   Seated heel raises   Modified height Standing 2x10 Standing 2x10 Standing 2x10 Standing 2x10 Standing 2   2x10 Standing 4 2x10 SL HR   X10   X5  SL HR   X10   X5  SL HR   X10   X5   Gentle DF self mob    2x10 red power band A-P glide with 12 inch box 5x10 on stool   12 x1' 5x10 on stool   12 x1' 5x10 on stool   12 x1' --  T stance 2x10B T stance 2x10 B T stance 2x10 B        Standing hip abd/ext x10 B  SLS vectors airex 2x10 bil SLS vectors airex 2x10 bil SLS vectors airex 2x10 bil SLS vectors airex x10 bil SLS airex assessed  - -        Side step no resist 3 short laps   Standing marches RTB forefoot 2x10 bil Standing marches YTB forefoot   2x10 bil -->  SLS paloff YTB with hip IR 2x10 SLS paloff YTB with hip IR 2x10        Towel scrunches 2x8 p!  D/c - Wobble board CW/CCW lvl 1    5x each Wobble board CW/CCW lvl 1  10x ea Rockerboard DL AP/ML   7k69 ea Rockerboard AP/ML 2x1' ea  Rockerboard AP/ML 2x1' ea Rockerboard AP/ML 2x1' ea         Iso clams 3x30 YTB Iso clams 3x30 YTB Time     SS YTB abv knee  3 laps  SS YTB abv ank   3 laps  SS GTB ank   4 laps near counter  SS GTB ank   4 laps near counter SS GTB ank   4 laps near counter   Home Exercise Program    Reviewed Updated  Updated         (all exercises were completed with therapist supervision unless noted)    (White date box = 2 week treatment reminder)    Patient HEP:  Access Code: [email or web address]  URL: [email or web address]  Date: 02/02/2024  Prepared by: Glady Caddy    Exercises  - Seated Ankle Alphabet  - 2 x daily - 7 x weekly - 1 sets - 26 reps  - Long Sitting Calf Stretch with Strap  - 2 x daily - 7 x weekly - 3 sets - 30 hold  - Seated Arch Lifts  - 2 x daily - 7 x weekly - 2 sets - 10 reps - 3 hold  - Toe Yoga - Alternating Great Toe and Lesser Toe Extension  - 2 x daily - 7 x weekly - 2 sets - 10 reps - 3 hold  - Standing Ankle Dorsiflexion Stretch on Chair  - 2 x daily - 7 x weekly - 2 sets - 10 reps - 5 hold  - Standing Hip Abduction with Counter Support  - 1 x daily - 7 x weekly - 2 sets - 10 reps  - Standing Hip Extension with Counter Support  - 1 x daily - 7 x weekly - 2 sets - 10 reps    Parts of this note were generated by the Epic EMR system/Dragon speech recognition and may contain inherent errors or omissions not intended by the user. Grammatical errors, random word insertions, deletions, pronoun errors and incomplete sentences are occasional consequences of  this technology due to software limitations. Not all errors are caught or corrected.  If there are questions or concerns about the content of this note or information contained within the body of this dictation they should be addressed directly with the author for clarification.

## 2024-03-26 NOTE — Progress Notes (Signed)
 PA submitted via covermymeds for Mounjaro 2.5MG /0.5ML auto-injectors. PA approved!    Key: [id number]    RjdzPi:[id number];  Status:Approved;  Review Type:Prior Auth;  Coverage Start Date:02/25/2024  Coverage End Date:10/02/2098  Authorization Expiration Date: October 02, 2098.       Patient to be notified via mychart message.

## 2024-03-28 NOTE — PT/OT Therapy Note (Signed)
 Name: Katelyn Caldwell Age: 73 y.o. Date of Service: 03/29/2024  Referring Physician: Lafrances Almarie DEL, PA  Date of Injury: No data found 01/09/2024   Date of Injury: 01/09/2024  Plan of Care Dates From:01/23/2024 To: 04/21/2024  Date Treatment Started: 01/23/2024    Sessions in Plan of Care: 20       Visit Count: 12   Diagnosis:    Diagnosis ICD-10-CM Associated Order   1. Pain in right ankle and joints of right foot  M25.571       2. Peroneal tendonitis, right  M76.71       3. Plantar fasciitis of right foot  M72.2                                      Subjective     Outcome Measure   Tool Used/Details: FOTO  Score: 65  Predicted Functional Outcome: 60    Daily Subjective   Pt states Wednesday was the first morning she woke up and wasn't painful from when she got out of bed. Aware that she will be discharged today. Continues to walk outside in the morning, does the elliptical at night.     Social Support/Occupation    Lives in: multiple level home         Occupation: Retired           Precautions: cardiac  osteopenia/osteoporosis  cancer history  hypertension  Allergies: Ciprofloxacin  and Nsaids    Objective                          Information below copied from evaluation as reference information unless noted/dated on the goal grid:    Functional Strength: (01/23/2024)  6/20   Sit to stand transfer: Independent    Heel raise: Bilateral:       x10 p! Heel in standing, p! in seated w/tennis x5        R:              L: R: 25 fatigue  L: 25 fatigue   Squat: Partial    (blank fields were intentionally left blank)     Balance (secs): Initial R Initial L 02/29/24    SLS (eyes open) 30 8 R  30  L 30   Tandem                 (blank fields were intentionally left blank)     Initial  Right  AROM Initial  Left AROM Ankle/LE ROM (degrees)  5/7 R AROM 6/20 R AROM 03/29/24 R AROM   4 12 Dorsiflexion  8 10 16    48 50 Plantarflexion       28 32 Inversion       20 20 Eversion       136 138 Knee Flexion       0 0 Knee Extension       (blank  fields were intentionally left blank)    Initial  R Initial  L LE Strength  MMT: /5  02/29/24 R  03/12/24 R 03/29/24 R AROM   4- p! 5 Ankle Dorsiflexion  4  5 5   4 5  Ankle Plantarflexion  4+ 5- 5-   4- 4+ Ankle Inversion  4  4+   4- 4+ Ankle Eversion  4 4+ 5   4- 4 Quadriceps  4 4+ Hamstrings       3+ 3+ Hip Abduction       (blank fields were intentionally left blank)               Treatment     Therapeutic Exercises - Justified to address any of the following:  To develop strength, endurance, ROM and/or flexibility.   Active warm-up on RB to promote blood flow and increase tissue extensibility while subjective history obtained to guide treatment session today.     See flowsheet for parameters: vc for setup and sequence  - 2 step SL heel raise for gastroc strength  + wall squat with heel raises  - gastroc/soleus stretch after warm up  - plantar fascia stretch at end of session near wall    Reviewed full HEP program with education/instruction on how to progress exercises (frequency/duration) with sets, reps, hold times, and resistance to promote continued improvement for R foot flexibility/strength/balance.  Special attention given to instruct patient to not push HEP to the point of pain and to contact our office if questions occur. Advised to follow-up with MD as prescribed/as needed.      Handout provided to patient with above information and phone numbers.    Time taken to assess obj measures, FOTO and update goals for discharge note.       Neuromuscular Re-Education - Justified to address any of the following:   Re-education of movement, balance, coordination, kinesthetic sense, posture and/or proprioception for sitting and/or standing activities.   - SL rockerboard diagonal for inc static stance on uneven surface to inc challenge    + reviewed verbally mod side plank with hip abductions    - Forward T to runner march to improve SL stability, unilat UE on counter to improve glut/ankle stabilizers.     - SLS  anti-rotation press pt unable d/c     ---      Flowsheet Row ---   Total Time    Timed Minutes 40 minutes   Total Time 40 minutes                         Assessment      Pt presents to 11th f/u for R foot/ankle pain.   Patient has progressed well with skilled physical therapy as demonstrated by improved Balance, Flexibility/ROM, Stabilization, and Strength, and functional outcomes. Patient demonstrates improved overall function as evidenced by improvement with prolonged standing, stair negotiation and carrying her grandchild for a few times up and down the stairs without increase of pain, pt continues to progress to building walking tolerance in order to return back to playing golf. . Pt has been instructed in comprehensive HEP and demonstrates understanding for self maintenance. Pt has been advised to continue HEP and follow up with PT or MD if any further issues arise. Katelyn Caldwell has met all therapy goals, and is no longer in need of skilled PT services. They will be discharged at this time.     Plan   Patient will be discharged with an independent HEP program and progressions to continue outside of physical therapy.         Goals      Goal 1: STG:  Patient will improve DF to 15 deg in order to stand for 30 min without heel pain.     6/20: progressed to 10 deg. Function met. - MD   Sessions: 8      Goal 2:  Patient  will demonstrate SLS to 30 sec in order to negotiate stairs without compensation or heel pain.     6/20: SLS met, going down stairs increases the pain but is improving. - MD   Sessions: 20      Goal 3: Patient will demonstrate global ankle strength to 4+/5 MMT in order to carry her grandchild (14 months) on the stairs and performing stair negotiation without heel pain.     6/27: obj goal met, no pain with carrying her grandchild up and down the stairs - IH   Sessions: 20      Goal 4: Patient will demonstrate independence in prescribed HEP with proper form, sets and reps for safe discharge to an  independent program.    6/27: goal met, Compliant with HEP and will benefit from progressions in ankle/foot mobility, SL balance and strength outside of PT. - IH   Sessions: 20                                                        Rodina Pinales J Ronia Hazelett, DPT       Exercise Flow Sheet    Precautions:        MD Follow-up:    Needs POC signed in 30 days from eval 4/22/2025or [facility] (Medicare or [employer] advantage)       Exercise Specifics   Visit #11   Visit #12   Visit #13   Visit #14   Visit #15   Visit #16   Visit #17   Visit #18   Visit #19     Visit #20   RB    5' 5'        PN / FOTO   Gastroc/soleus stretch    3x30 3x30           Ankle 3 way    -            HR/TR    SL HR 2 step 2x10 SL HR 2 step 2x10           SLS    Anti rotation press YTB x10 bil    Rotations DL k89 Attempted d/c           Step down    -            SS    -            Rockerboard    diagonal single leg x1' bil diagonal single leg x1' bil               Wall squat heel raise 3x10 bil Wall squat heel raise   2x10 bil               Fwd T to runner march 10# KB Fwd T to runner march 10# KB  2x10B               Mod side plank w/ hip abd x10 bil Reviewed            Home Exercise Program     Updated            (all exercises were completed with therapist supervision unless noted)    (White date box = 2 week treatment reminder)    Patient HEP:  Exercise Flow Sheet    Precautions:   --     MD Follow-up:  --  Needs POC signed in 30 days from eval 4/22/2025or [facility] (Medicare or [employer] advantage)       Exercise Specifics   Visit #2   Visit #3   Visit #4   Visit #5   Visit #6   Visit #7   Visit #8   Visit #9   Visit #10   RB    5' 5' 5' 5' 5' 5' 5' 5' PN / FOTO  5'   Ankle 4 way     3-way YTB 30x 3-way YTB 30x 3-way RTB 30x 3-way RTB 30x Lat step down 4 x10 ea  + 5# KB in opp UE  X8 ea Lat step down 4 x10 ea  + 5# KB in opp UE  X10ea  10#x10ea Lat step down 4 10# KB   X10 B  Lat step down 4 10# KB   X10 B  Lat step down 4 10# KB   X10 B   Arch lifts     x1'  x1' x1 Standing x1' Standing x1' -- Step up 6 x15 RLE - -   Toe yoga     x1' 5x20 5x20 5x20 5x1' -- Plantar fascia stretch   10x10 Plantar fascia stretch   10x10 Plantar fascia stretch   10x10   Standing gastroc/soleus     3x30 3x30 3x30 3x30 2x30 3x30 3x30 3x30 3x30   Seated heel raises   Modified height Standing 2x10 Standing 2x10 Standing 2x10 Standing 2x10 Standing 2   2x10 Standing 4 2x10 SL HR   X10   X5  SL HR   X10   X5  SL HR   X10   X5   Gentle DF self mob    2x10 red power band A-P glide with 12 inch box 5x10 on stool   12 x1' 5x10 on stool   12 x1' 5x10 on stool   12 x1' --  T stance 2x10B T stance 2x10 B T stance 2x10 B        Standing hip abd/ext x10 B  SLS vectors airex 2x10 bil SLS vectors airex 2x10 bil SLS vectors airex 2x10 bil SLS vectors airex x10 bil SLS airex assessed  - -        Side step no resist 3 short laps   Standing marches RTB forefoot 2x10 bil Standing marches YTB forefoot   2x10 bil -->  SLS paloff YTB with hip IR 2x10 SLS paloff YTB with hip IR 2x10        Towel scrunches 2x8 p!  D/c - Wobble board CW/CCW lvl 1    5x each Wobble board CW/CCW lvl 1  10x ea Rockerboard DL AP/ML   7k69 ea Rockerboard AP/ML 2x1' ea Rockerboard AP/ML 2x1' ea Rockerboard AP/ML 2x1' ea         Iso clams 3x30 YTB Iso clams 3x30 YTB Time     SS YTB abv knee  3 laps  SS YTB abv ank   3 laps  SS GTB ank   4 laps near counter  SS GTB ank   4 laps near counter SS GTB ank   4 laps near counter   Home Exercise Program    Reviewed Updated  Updated         (all exercises were completed with therapist supervision unless noted)    (White date box = 2 week treatment  reminder)    Patient HEP:  Access Code: [email or web address]  URL: [email or web address]  Date: 03/29/2024  Prepared by: Glady Caddy    Exercises  - Seated Ankle Alphabet  - 2 x daily - 7 x weekly - 1 sets - 26 reps  - Long Sitting Calf Stretch with Strap  - 2 x daily - 7 x weekly - 3 sets - 30 hold  - Standing Ankle  Dorsiflexion Stretch on Chair  - 2 x daily - 7 x weekly - 2 sets - 10 reps - 5 hold  - Standing Quadratus Lumborum Mobilization with Small Ball on Wall  - 1 x daily - 7 x weekly - 1 sets - 2-3 minute hold  - Seated Heel Raise  - 1 x daily - 7 x weekly - 3 sets - 10 reps  - Single Leg Heel Raise on Step  - 1 x daily - 7 x weekly - 3 sets - 10 reps  - Walking Forward Lunge  - 1 x daily - 7 x weekly - 2 sets - 10 reps  - Wall Squat with Heel Raises  - 1 x daily - 7 x weekly - 2 sets - 10 reps  - Forward T  - 1 x daily - 7 x weekly - 2 sets - 10 reps  - Modified Side Plank with Hip Abduction  - 1 x daily - 7 x weekly - 1 sets - 10 reps  - Single Leg Balance with Clock Reach  - 1 x daily - 7 x weekly - 2 sets - 10 reps    Parts of this note were generated by the Epic EMR system/Dragon speech recognition and may contain inherent errors or omissions not intended by the user. Grammatical errors, random word insertions, deletions, pronoun errors and incomplete sentences are occasional consequences of this technology due to software limitations. Not all errors are caught or corrected.  If there are questions or concerns about the content of this note or information contained within the body of this dictation they should be addressed directly with the author for clarification.

## 2024-03-29 ENCOUNTER — Inpatient Hospital Stay

## 2024-03-29 DIAGNOSIS — M7671 Peroneal tendinitis, right leg: Secondary | ICD-10-CM

## 2024-03-29 DIAGNOSIS — M722 Plantar fascial fibromatosis: Secondary | ICD-10-CM

## 2024-03-29 DIAGNOSIS — M25571 Pain in right ankle and joints of right foot: Secondary | ICD-10-CM

## 2024-03-29 NOTE — Progress Notes (Signed)
 Name: Katelyn Caldwell Age: 73 y.o. Date of Service: 03/29/2024  Referring Physician: Lafrances Almarie DEL, [place]  Date of Injury: 01/09/2024  Plan of Care Dates From:01/23/2024 To: 04/21/2024  Date Treatment Started: 01/23/2024    Sessions in Plan of Care: 20       Visit Count: 12   Diagnosis:    Diagnosis ICD-10-CM Associated Order   1. Pain in right ankle and joints of right foot  M25.571       2. Peroneal tendonitis, right  M76.71       3. Plantar fasciitis of right foot  M72.2                       Objective                          Assessment     Subjective      Outcome Measure   Tool Used/Details: FOTO  Score: 65  Predicted Functional Outcome: 60     Daily Subjective   Pt states Wednesday was the first morning she woke up and wasn't painful from when she got out of bed. Aware that she will be discharged today. Continues to walk outside in the morning, does the elliptical at night.           Objective               Information below copied from evaluation as reference information unless noted/dated on the goal grid:     Functional Strength: (01/23/2024)  6/20   Sit to stand transfer: Independent     Heel raise: Bilateral:       x10 p! Heel in standing, p! in seated w/tennis x5        R:              L: R: 25 fatigue  L: 25 fatigue   Squat: Partial     (blank fields were intentionally left blank)     Balance (secs): Initial R Initial L 02/29/24    SLS (eyes open) 30 8 R  30  L 30   Tandem                   (blank fields were intentionally left blank)     Initial  Right  AROM Initial  Left AROM Ankle/LE ROM (degrees)  5/7 R AROM 6/20 R AROM 03/29/24 R AROM   4 12 Dorsiflexion  8 10 16    48 50 Plantarflexion         28 32 Inversion         20 20 Eversion         136 138 Knee Flexion         0 0 Knee Extension         (blank fields were intentionally left blank)     Initial  R Initial  L LE Strength  MMT: /5  02/29/24 R  03/12/24 R 03/29/24 R AROM   4- p! 5 Ankle Dorsiflexion  4  5 5   4 5  Ankle Plantarflexion  4+ 5- 5-   4- 4+  Ankle Inversion  4   4+   4- 4+ Ankle Eversion  4 4+ 5   4- 4 Quadriceps         4 4+ Hamstrings         3+ 3+ Hip Abduction         (  blank fields were intentionally left blank)              Assessment     Pt presents to 11th f/u for R foot/ankle pain.   Patient has progressed well with skilled physical therapy as demonstrated by improved Balance, Flexibility/ROM, Stabilization, and Strength, and functional outcomes. Patient demonstrates improved overall function as evidenced by improvement with prolonged standing, stair negotiation and carrying her grandchild for a few times up and down the stairs without increase of pain, pt continues to progress to building walking tolerance in order to return back to playing golf. . Pt has been instructed in comprehensive HEP and demonstrates understanding for self maintenance. Pt has been advised to continue HEP and follow up with PT or MD if any further issues arise. Mrs. Severtson has met all therapy goals, and is no longer in need of skilled PT services. They will be discharged at this time.      Plan   Patient will be discharged with an independent HEP program and progressions to continue outside of physical therapy.                  Goals      Goal 1: STG:  Patient will improve DF to 15 deg in order to stand for 30 min without heel pain.     6/20: progressed to 10 deg. Function met. - MD   Sessions: 8      Goal 2:  Patient will demonstrate SLS to 30 sec in order to negotiate stairs without compensation or heel pain.     6/20: SLS met, going down stairs increases the pain but is improving. - MD   Sessions: 20      Goal 3: Patient will demonstrate global ankle strength to 4+/5 MMT in order to carry her grandchild (14 months) on the stairs and performing stair negotiation without heel pain.     6/27: obj goal met, no pain with carrying her grandchild up and down the stairs - IH   Sessions: 20      Goal 4: Patient will demonstrate independence in prescribed HEP with proper form,  sets and reps for safe discharge to an independent program.    6/27: goal met, Compliant with HEP and will benefit from progressions in ankle/foot mobility, SL balance and strength outside of PT. - IH   Sessions: 20                                  Adelei Scobey JINNY Caddy, DPT

## 2024-04-03 ENCOUNTER — Encounter (INDEPENDENT_AMBULATORY_CARE_PROVIDER_SITE_OTHER): Payer: Self-pay | Admitting: Family Medicine

## 2024-04-08 ENCOUNTER — Other Ambulatory Visit (INDEPENDENT_AMBULATORY_CARE_PROVIDER_SITE_OTHER): Payer: Self-pay | Admitting: Cardiovascular Disease

## 2024-04-17 ENCOUNTER — Telehealth (INDEPENDENT_AMBULATORY_CARE_PROVIDER_SITE_OTHER): Admitting: Family Medicine

## 2024-04-17 ENCOUNTER — Encounter (INDEPENDENT_AMBULATORY_CARE_PROVIDER_SITE_OTHER): Payer: Self-pay | Admitting: Family Medicine

## 2024-04-17 DIAGNOSIS — I10 Essential (primary) hypertension: Secondary | ICD-10-CM

## 2024-04-17 DIAGNOSIS — I517 Cardiomegaly: Secondary | ICD-10-CM

## 2024-04-17 DIAGNOSIS — I2510 Atherosclerotic heart disease of native coronary artery without angina pectoris: Secondary | ICD-10-CM | POA: Insufficient documentation

## 2024-04-17 DIAGNOSIS — N1831 Chronic kidney disease, stage 3a: Secondary | ICD-10-CM

## 2024-04-17 DIAGNOSIS — E1122 Type 2 diabetes mellitus with diabetic chronic kidney disease: Secondary | ICD-10-CM

## 2024-04-17 MED ORDER — TIRZEPATIDE 5 MG/0.5ML SC SOAJ
5.0000 mg | SUBCUTANEOUS | 1 refills | Status: DC
Start: 2024-04-17 — End: 2024-06-15

## 2024-04-17 MED ORDER — EMPAGLIFLOZIN 25 MG PO TABS
25.0000 mg | ORAL_TABLET | Freq: Every morning | ORAL | 3 refills | Status: DC
Start: 2024-04-17 — End: 2024-07-01

## 2024-04-17 NOTE — Progress Notes (Signed)
 Chief Complaint   Patient presents with    Coronary Artery Disease       Verbal consent has been obtained from the patient to conduct a video and telephone visit to minimize exposure to COVID-19: yes    S:      Problem   Coronary Artery Disease Involving Native Coronary Artery of Native Heart Without Angina Pectoris    7/25:  recent with high CAC score.  Worse in the LAD.  On statin at max dose.  May benefit from zetia.  Last cholesterol panel close to ideal. Encouraged her to discuss with cardiology an intravascular contrast study to ensure noncalcified CAD is properly assessed.       Type 2 Diabetes Mellitus With Diabetic Chronic Kidney Disease (Cms/Hcc)    11/23:  will be seeing dietician.    3/24:  due for her A1c.    8/24:  now on jardiance .  Continues with her metformin  and januvia  combination.  4/25:  The patient, with a history of diabetes and IBS, presents with worsening gastrointestinal symptoms which she believes have been exacerbated by her diabetes medications. The patient reports that her IBS was manageable until she was diagnosed with diabetes and started on metformin , Janumet , and Jardiance . The patient's symptoms have progressively worsened with the addition of each new medication. The patient also reports a recent foot injury which has limited her physical activity and potentially impacted her diabetes management. The patient has consulted with a gastroenterologist who suggested that the diabetes medications could be causing the gastrointestinal issues. The patient is seeking advice on adjusting her medication to alleviate her gastrointestinal symptoms.  7/25:  doing ok with mounjaro  and ok with increase to the 5 mg daily.  Continues on jardiance  25 mg daily and will refill.       Essential Hypertension    Recently with elevation in her evening BP to ~160-170 SBP despite good hydration and using cozaar  regularly.  No smoking.  No NSAIDs.  No excessive EtOH.  Has reduced sodium but to uncertain  amount.  Exercising (walking) seems to help.  No decongestants.  Has used cozaar  with additional dose with some benefit and we are looking to make that a routine.  1/23:  Reports normal levels at home.  A bit elevated here today.  3/24:  well controlled today.  7/25:  now off the losartan  but doing ok with her BP.  Continues on the metoprolol .           Allergies[1]    Current Medications[2]    ROS    All other systems were reviewed and are negative    O:  There were no vitals taken for this visit., There is no height or weight on file to calculate BMI.  Vital signs reviewed  GEN:  NAD, nonobese  NEURO:  CN2-12 without focal defecit  PULM:  unlabored respirations  SKIN:  dry, no visible rash    A/P:     1. Mild concentric left ventricular hypertrophy        2. Type 2 diabetes mellitus with stage 3a chronic kidney disease, without long-term current use of insulin  (CMS/HCC)  empagliflozin  (JARDIANCE ) 25 MG tablet      3. Essential hypertension  Continue current management       4. Coronary artery disease involving native coronary artery of native heart without angina pectoris  F/u cardiology            Time spent in discussion: 37 minutes  Risk & Benefits of the new medication(s) were explained to the patient (and family) who verbalized understanding & agreed to the treatment plan. Patient (family) encouraged to contact me/clinical staff with any questions/concerns    Glendia CHRISTELLA Caras, MD         [1]   Allergies  Allergen Reactions    Ciprofloxacin  Other (See Comments)     Severe Connective Tissue (joint) pain and stiffness    Nsaids Other (See Comments)     Pt states this causes her bleeding d/t PAN   [2]   Current Outpatient Medications   Medication Sig Dispense Refill    atorvastatin  (LIPITOR) 80 MG tablet Take 1 tablet (80 mg) by mouth daily 90 tablet 3    cyanocobalamin  ([employer] VITAMIN B12) 1000 MCG tablet Take 0.5 tablets (500 mcg) by mouth daily 45 tablet 0    empagliflozin  (JARDIANCE ) 25 MG tablet Take 1 tablet  (25 mg) by mouth once every morning 90 tablet 3    fluticasone  (FLONASE ) 50 MCG/ACT nasal spray 2 sprays by Nasal route every evening 16 g 3    glucose blood (FREESTYLE LITE) test strip Check blood sugar once daily 100 strip 3    Lancets (freestyle) lancets Check blood sugar once daily 100 each 3    lidocaine  (Lidoderm ) 5 % Place 1 patch onto affected skin every twelve hours as needed for discomfort.  Remove each patch after 12 hours of use 30 patch 3    losartan  (COZAAR ) 25 MG tablet Take 1 tablet (25 mg) by mouth daily 90 tablet 3    Magnesium 400 MG Tab Take 0.5 tablets (200 mg) by mouth 2 (two) times daily slowmag      metoprolol  tartrate (LOPRESSOR ) 25 MG tablet Take 1.5 tablets (37.5 mg) by mouth daily 135 tablet 3    pantoprazole  (PROTONIX ) 40 MG tablet Take 1 tablet (40 mg) by mouth once every evening 100 tablet 3    Probiotic Product (PROBIOTIC-10 PO) Take by mouth daily      tirzepatide  (MOUNJARO ) 5 MG/0.5ML injection Inject 5 mg into the skin once a week 2 mL 1    vitamin D  (CHOLECALCIFEROL ) 25 MCG (1000 UT) tablet Take 1 tablet (1,000 Units) by mouth daily 100 tablet 3     No current facility-administered medications for this visit.

## 2024-04-19 ENCOUNTER — Encounter (INDEPENDENT_AMBULATORY_CARE_PROVIDER_SITE_OTHER): Payer: Self-pay | Admitting: Family Medicine

## 2024-05-22 ENCOUNTER — Encounter (INDEPENDENT_AMBULATORY_CARE_PROVIDER_SITE_OTHER): Payer: Self-pay | Admitting: Family Medicine

## 2024-05-23 ENCOUNTER — Encounter (INDEPENDENT_AMBULATORY_CARE_PROVIDER_SITE_OTHER): Payer: Self-pay | Admitting: Family Medicine

## 2024-05-23 ENCOUNTER — Telehealth (INDEPENDENT_AMBULATORY_CARE_PROVIDER_SITE_OTHER): Admitting: Family Medicine

## 2024-05-23 DIAGNOSIS — E1122 Type 2 diabetes mellitus with diabetic chronic kidney disease: Secondary | ICD-10-CM

## 2024-05-23 DIAGNOSIS — I517 Cardiomegaly: Secondary | ICD-10-CM

## 2024-05-23 DIAGNOSIS — I2510 Atherosclerotic heart disease of native coronary artery without angina pectoris: Secondary | ICD-10-CM

## 2024-05-23 DIAGNOSIS — N1831 Chronic kidney disease, stage 3a: Secondary | ICD-10-CM

## 2024-05-23 DIAGNOSIS — I10 Essential (primary) hypertension: Secondary | ICD-10-CM

## 2024-05-23 MED ORDER — GLIPIZIDE ER 2.5 MG PO TB24
2.5000 mg | ORAL_TABLET | Freq: Every day | ORAL | 0 refills | Status: AC
Start: 2024-05-23 — End: ?

## 2024-05-23 NOTE — Progress Notes (Signed)
 [facility]       Telehealth:  The Patient has given verbal consent for delivery of health care via telehealth.   The patient is located at Home in [place]   The encounter provider is located at their Medical Office in [place]   Epic Video Client was utilized for Real Time/Synchronous Telehealth.   The time spent in medical discussion during this visit was 20 minutes.        Subjective     Chief Complaint   Patient presents with    Chronic Kidney Disease    Diabetes    Rash     History of Present Illness  Katelyn Caldwell is a 73 year old female with diabetes who presents with a new rash and concerns about medication side effects.    She developed a rash on her back three days ago, located in three spots, resembling shingles but not severe. Initially itchy, it is now asymptomatic. She has received all shingles vaccines and there is no rash at the injection site of her current medication, Mounjaro .    Since starting the 5 mg dose of Mounjaro , she experiences increased fatigue and significant weight loss of eight pounds over two weeks despite regular eating. Her blood sugar levels remain elevated with a recent A1c of 8.1%.    She has a history of polyarteritis nodosa and has had rashes in the past that were difficult to diagnose. She is not taking any blood pressure medication due to low readings but continues on metoprolol . Fatigue affects her ability to babysit and cook for her family. She is considering changes in her nephrology and cardiology care due to logistical issues with her current providers.  Review of Systems    Objective   There were no vitals taken for this visit.  Physical Exam  Physical Exam         Results  LABS    A1c: 8.1% (05/15/2024)      Assessment/Plan     Assessment & Plan  Type 2 diabetes mellitus with suboptimal glycemic control and medication intolerance  Suboptimal glycemic control with A1c of 8.1%. Mounjaro  causes fatigue and weight loss, affecting quality of life  despite benefits.  - Prescribe glipizide  2.5 mg extended release once daily.  - Continue current dose of Mounjaro , reassess at next visit.  - Schedule follow-up for July 01, 2024, to evaluate A1c.    Rash of unclear etiology  Rash on back, initially itchy, now asymptomatic. Differential includes allergic reaction or polyarteritis nodosa. Unlikely shingles or Mounjaro -related unless worsens.  - Consider dermatology referral if rash persists or worsens.    Polyarteritis nodosa  No active disease. Rash possibly related but unconfirmed.  - Await nephrologist's call to discuss lab results.    Hypertension, controlled off antihypertensive therapy  Blood pressure low, sometimes symptomatic. Off losartan , on metoprolol .  - Ensure adequate hydration.    Fatigue and unintentional weight loss  Increased fatigue and weight loss since Mounjaro  dose increase. Considering dose reduction.  - Consider reducing Mounjaro  dose if fatigue persists.    Follow-Up  Follow-up plans for diabetes management and specialist referrals discussed.  - Provide open referrals for nephrology and cardiology.  - Ensure specialists send consultation notes.    Recording duration: 15 minutes    Verbal consent obtained to record this visit.

## 2024-06-07 ENCOUNTER — Encounter (INDEPENDENT_AMBULATORY_CARE_PROVIDER_SITE_OTHER): Payer: Self-pay | Admitting: Family Medicine

## 2024-06-10 ENCOUNTER — Encounter (INDEPENDENT_AMBULATORY_CARE_PROVIDER_SITE_OTHER): Payer: Self-pay

## 2024-06-14 ENCOUNTER — Encounter (INDEPENDENT_AMBULATORY_CARE_PROVIDER_SITE_OTHER): Payer: Self-pay | Admitting: Family Medicine

## 2024-06-15 MED ORDER — TIRZEPATIDE 5 MG/0.5ML SC SOAJ
5.0000 mg | SUBCUTANEOUS | 1 refills | Status: DC
Start: 2024-06-15 — End: 2024-08-12

## 2024-06-20 ENCOUNTER — Encounter (INDEPENDENT_AMBULATORY_CARE_PROVIDER_SITE_OTHER): Payer: Self-pay | Admitting: Family Medicine

## 2024-07-01 ENCOUNTER — Encounter (INDEPENDENT_AMBULATORY_CARE_PROVIDER_SITE_OTHER): Payer: Self-pay | Admitting: Family Medicine

## 2024-07-01 ENCOUNTER — Ambulatory Visit (INDEPENDENT_AMBULATORY_CARE_PROVIDER_SITE_OTHER): Admitting: Family Medicine

## 2024-07-01 VITALS — BP 127/75 | HR 78 | Temp 98.0°F | Wt 130.0 lb

## 2024-07-01 DIAGNOSIS — N1831 Chronic kidney disease, stage 3a: Secondary | ICD-10-CM

## 2024-07-01 DIAGNOSIS — E1122 Type 2 diabetes mellitus with diabetic chronic kidney disease: Secondary | ICD-10-CM

## 2024-07-01 LAB — POCT HEMOGLOBIN A1C: POCT Hgb A1C: 7.1 % — AB (ref 4.0–5.9)

## 2024-07-01 MED ORDER — EMPAGLIFLOZIN 10 MG PO TABS
10.0000 mg | ORAL_TABLET | Freq: Every morning | ORAL | 1 refills | Status: AC
Start: 2024-07-01 — End: ?

## 2024-07-01 NOTE — Progress Notes (Signed)
 Have you seen any specialists/other providers since your last visit with us ?    No      The patient was informed that the following HM items are still outstanding:   Health Maintenance Due   Topic Date Due    Advance Directive on File  Never done    Colorectal Cancer Screening  09/02/2021    URINE MICROALBUMIN  01/22/2022    FALLS RISK ANNUAL  04/01/2023    Medicare Annual Wellness Visit  04/01/2023

## 2024-07-01 NOTE — Progress Notes (Signed)
 [facility]           Subjective     Chief Complaint   Patient presents with    Weight Check     F/u on mounjaro , patient reports fatigue on and off with med, but improving     Diabetes Follow-up     A1c check due  F/u on mounjaro      History of Present Illness  Katelyn Caldwell is a 73 year old female with diabetes who presents for medication management and blood sugar control.    She manages her diabetes with Mounjaro , Jardiance , and glipizide . After the thirteenth dose of Mounjaro , her blood sugar levels improved. She is currently taking Mounjaro  5 mg, Jardiance  10 mg, and glipizide  2.5 mg. She frequently tests her blood sugar levels to ensure stability. She experienced fatigue in the past, which she attributes to her medication regimen, but feels better recently.    She abstains from alcohol due to concerns about interactions with her medications, particularly glipizide . She is cautious about consuming wine, especially when her blood sugar is low, and monitors her levels closely.    She discusses her blood pressure management, noting episodes of hypotension and recent weight loss. She has discontinued her blood pressure medication and is monitoring her condition.  Review of Systems    Objective   BP 127/75 (BP Site: Left arm, Patient Position: Sitting, Cuff Size: Medium)   Pulse 78   Temp 98 F (36.7 C) (Temporal)   Wt 59 kg (130 lb) Comment: shoes on  SpO2 95%   BMI 22.31 kg/m   Physical Exam  Physical Exam       Results        Assessment/Plan     Assessment & Plan  Type 2 diabetes mellitus  Improved blood sugar control with current regimen. Potential hypoglycemia risk with alcohol due to glipizide . Januvia  contraindicated with Mounjaro .  - Continue Mounjaro  5 mg.  - Continue Jardiance  10 mg.  - Continue glipizide  2.5 mg.  - Monitor blood sugars closely.  - Avoid alcohol or consume judiciously due to hypoglycemia risk.  - Consider discontinuing glipizide  if blood sugars consistently  low.    Chronic kidney disease  Jardiance  preferred for renal benefits.  - Continue Jardiance  10 mg.    Low blood pressure  Potentially related to recent weight loss. Blood pressure medication discontinued.  - Maintain hydration.  - Consider increased dietary sodium intake.    Hyperlipidemia  Goal to reduce LDL below 70 mg/dL with cardiology input.  - Continue current lipid management plan.    Recording duration: 11 minutes    Verbal consent obtained to record this visit.

## 2024-07-04 ENCOUNTER — Encounter (INDEPENDENT_AMBULATORY_CARE_PROVIDER_SITE_OTHER): Payer: Self-pay | Admitting: Family Medicine

## 2024-07-04 ENCOUNTER — Ambulatory Visit (FREE_STANDING_LABORATORY_FACILITY): Admitting: Nurse Practitioner

## 2024-07-04 ENCOUNTER — Encounter (INDEPENDENT_AMBULATORY_CARE_PROVIDER_SITE_OTHER): Payer: Self-pay

## 2024-07-04 VITALS — BP 120/73 | HR 87 | Temp 97.4°F | Resp 20 | Ht 64.0 in | Wt 131.0 lb

## 2024-07-04 DIAGNOSIS — E11649 Type 2 diabetes mellitus with hypoglycemia without coma: Secondary | ICD-10-CM

## 2024-07-04 DIAGNOSIS — N3000 Acute cystitis without hematuria: Secondary | ICD-10-CM

## 2024-07-04 DIAGNOSIS — R350 Frequency of micturition: Secondary | ICD-10-CM

## 2024-07-04 LAB — MCKESSON POCT UA 120
Bilirubin UA: NEGATIVE
Blood UA: NEGATIVE
Ketone UA: NEGATIVE — AB
Leukocytes UA: NEGATIVE
Nitrite UA: POSITIVE — AB
Specific Gravity UA: 1.01
Urobilinogen UA: NEGATIVE
pH UA: 6

## 2024-07-04 LAB — POCT GLUCOSE: Whole Blood Glucose POCT: 196 mg/dL — AB (ref 70–100)

## 2024-07-04 MED ORDER — SULFAMETHOXAZOLE-TRIMETHOPRIM 800-160 MG PO TABS
1.0000 | ORAL_TABLET | Freq: Two times a day (BID) | ORAL | 0 refills | Status: AC
Start: 2024-07-04 — End: 2024-07-11

## 2024-07-04 NOTE — Patient Instructions (Addendum)
 Start Bactrim  DS, take 1 tablet twice a day for 7 days.    Drink plenty of fluids.    Follow-up with primary care provider or return to urgent care if not improving.    Call 911 and Go to the emergency department if blood sugars continue to drop, if the dizziness returns, or if you continue to have vasovagal reactions.

## 2024-07-04 NOTE — Progress Notes (Signed)
 [facility]  OFFICE NOTE         Subjective   Historian: Patient      Chief Complaint   Patient presents with    Urinary Frequency     Pt complains of urination frequency and dizziness x today. Took no medication          History of Present Illness  Katelyn Caldwell is a 73 year old female with diabetes who presents with symptoms suggestive of a urinary tract infection and vasovagal episodes.    She experiences increased urinary frequency, waking five times last night, with mild discomfort mostly before reaching the bathroom. She sometimes has no symptoms even with a UTI.     She describes a vasovagal episode while leaving for the appointment, feeling dizzy, with tinnitus, and a sudden sensation of heat.  She also states she feels shaky.    She has had similar episodes before, often associated with UTIs, sometimes experiencing hearing loss and past blackouts, though she did not black out this time.    She is on new diabetes medications, including glipizide , Jardiance , and Mounjaro . She experiences low blood sugar episodes, blood sugar this morning was 128 or after eating several candies.    She recently reduced her Jardiance  dose due to hypoglycemia episodes, with levels as low as 68-70 mg/dL. Her last A1c was 7.1. She consumed hard candies and salty chips before the appointment to manage low blood sugar and blood pressure.    He denies nausea, vomiting, abdominal pain, or back pain. She feels shaky and not quite right, associating this with lightheadedness.     History:  Medications and Allergies reviewed.   Pertinent Past Medical, Surgical, Family and Social History were reviewed.        Objective     Vitals:    07/04/24 1438   BP: 120/73   BP Site: Left arm   Patient Position: Sitting   Cuff Size: Medium   Pulse: 87   Resp: 20   Temp: 97.4 F (36.3 C)   TempSrc: Tympanic   SpO2: 97%   Weight: 59.4 kg (131 lb)   Height: 1.626 m (5' 4)     Physical Exam  Constitutional:       Appearance:  Normal appearance.   HENT:      Head: Normocephalic and atraumatic.      Right Ear: Tympanic membrane and ear canal normal.      Left Ear: Tympanic membrane and ear canal normal.      Mouth/Throat:      Mouth: Mucous membranes are moist.      Pharynx: No oropharyngeal exudate or posterior oropharyngeal erythema.     Eyes: Conjunctivae are normal. Pupils are equal, round, and reactive to light. Cardiovascular:      Rate and Rhythm: Normal rate and regular rhythm.      Pulses: Normal pulses.      Heart sounds: Normal heart sounds.   Pulmonary:      Effort: Pulmonary effort is normal.      Breath sounds: Normal breath sounds.   Abdominal:      General: Abdomen is flat. Bowel sounds are normal. There is no distension.      Palpations: Abdomen is soft. There is no mass.      Tenderness: There is no abdominal tenderness. There is no right CVA tenderness, left CVA tenderness or guarding.   Musculoskeletal:         General: Normal range of  motion.      Cervical back: Normal range of motion.   Neurological:      General: No focal deficit present.      Mental Status: She is alert and oriented to person, place, and time.      Cranial Nerves: Cranial nerves 2-12 are intact.      Sensory: Sensation is intact.      Motor: Motor function is intact.      Coordination: Coordination is intact.      Gait: Gait is intact.   Skin:     General: Skin is warm and dry.      Capillary Refill: Capillary refill takes less than 2 seconds.   Vitals and nursing note reviewed.      Physical Exam    Urgent Care Course   LABS  The following POCT tests were ordered, reviewed and discussed with the patient/family.     Results for orders placed or performed in visit on 07/04/24 (from the past 24 hours)   POCT Glucose    Collection Time: 07/04/24  3:09 PM   Result Value    Whole Blood Glucose POCT 196 (A)   [employer] POCT UA    Collection Time: 07/04/24  3:34 PM   Result Value    Color, UA Light Yellow    Clarity, UA Cloudy    Leukocytes UA Negative     Nitrite UA Positive (A)    Urobilinogen UA Negative (0.2 mg/dl)    Protein UA Trace (15mg /dl) (A)    pH UA 6.0    Blood UA Negative    Specific Gravity UA 1.010    Ketone UA Negative (A)    Bilirubin UA Negative    Glucose UA 3+ (1000 mg/dl) (A)       There were no x-rays reviewed with this patient during the visit.      Procedures   Procedures     Assessment / Plan     Differential Diagnoses including but not limited to: Acute cystitis, hypoglycemic event, nocturia likely related to change in diabetes medications.  Dehydration, low concern for acute coronary syndrome-patient has no chest pain, low concern for intracranial pathology.    Assessment & Plan  Type 2 diabetes mellitus with hypoglycemia secondary to diabetes treatment  Recent hypoglycemia likely due to diabetes medication adjustments. Improved A1c from 8.1% to 7.1% indicates better glycemic control.  - Perform Accu-Check to assess current blood glucose level.    Vasovagal episode  Recent episode with dizziness, tinnitus, and feeling hot. No loss of consciousness.    Suspected urinary tract infection with frequency of micturition  Increased frequency of micturition and mild pre-urination discomfort suggest UTI.  - Obtain urine sample for urinalysis.       Dilynn was seen today for urinary frequency.    Diagnoses and all orders for this visit:    Acute cystitis without hematuria  -     sulfamethoxazole -trimethoprim  (BACTRIM  DS) 800-160 MG per tablet; Take 1 tablet by mouth 2 (two) times daily for 7 days    Frequency of urination  -     [employer] POCT UA; Future  -     Culture, Urine; Future  -     POCT Glucose; Future  -     [employer] POCT UA  -     Culture, Urine  -     POCT Glucose    Hypoglycemia due to type 2 diabetes mellitus (CMS/HCC)    Urinalysis shows nitrites.  No  hematuria, no showing glucose which is consistent with patient's history of diabetes.  Her blood sugar is 196.    Start Bactrim  DS, take 1 tablet twice a day for 7 days.    Drink plenty of  fluids.    Follow-up with primary care provider or return to urgent care if not improving.    Call 911 and Go to the emergency department if blood sugars continue to drop, if the dizziness returns, or if you continue to have vasovagal reactions.               The indications for early follow-up with PCP and return to UC were discussed. Patient/family received education on the working diagnosis, diagnostic uncertainties, and proposed treatment plan. Indications for emergency evaluation in the ED were reviewed. Written and verbal discharge instructions were provided and discussed and all questions from the patient/family were addressed, with no apparent barriers.      Verbal consent obtained to record this visit.

## 2024-07-06 LAB — CULTURE, URINE: Culture Urine: >100000 — AB

## 2024-07-07 ENCOUNTER — Ambulatory Visit (INDEPENDENT_AMBULATORY_CARE_PROVIDER_SITE_OTHER): Payer: Self-pay | Admitting: Family Nurse Practitioner

## 2024-07-10 ENCOUNTER — Encounter (INDEPENDENT_AMBULATORY_CARE_PROVIDER_SITE_OTHER): Payer: Self-pay

## 2024-07-30 ENCOUNTER — Encounter (INDEPENDENT_AMBULATORY_CARE_PROVIDER_SITE_OTHER): Payer: Self-pay | Admitting: Family Medicine

## 2024-07-30 MED ORDER — METOPROLOL TARTRATE 25 MG PO TABS
37.5000 mg | ORAL_TABLET | Freq: Every day | ORAL | 3 refills | Status: DC
Start: 2024-07-30 — End: 2024-07-31

## 2024-07-31 MED ORDER — METOPROLOL TARTRATE 25 MG PO TABS
37.5000 mg | ORAL_TABLET | Freq: Every day | ORAL | 3 refills | Status: AC
Start: 2024-07-31 — End: ?

## 2024-07-31 NOTE — Progress Notes (Signed)
 Hi there,    Patient actually would prefer this rx to be sent to [facility] please & Thank you.

## 2024-07-31 NOTE — Addendum Note (Signed)
 Addended by: PERLA, LESLIE M. on: 07/31/2024 10:05 AM     Modules accepted: Orders

## 2024-08-10 ENCOUNTER — Encounter (INDEPENDENT_AMBULATORY_CARE_PROVIDER_SITE_OTHER): Payer: Self-pay

## 2024-08-11 ENCOUNTER — Encounter (INDEPENDENT_AMBULATORY_CARE_PROVIDER_SITE_OTHER): Payer: Self-pay | Admitting: Family Medicine

## 2024-08-11 DIAGNOSIS — E1122 Type 2 diabetes mellitus with diabetic chronic kidney disease: Secondary | ICD-10-CM

## 2024-08-12 MED ORDER — TIRZEPATIDE 5 MG/0.5ML SC SOAJ: 5.0000 mg | SUBCUTANEOUS | 1 refills | Status: DC

## 2024-08-12 MED ORDER — TIRZEPATIDE 5 MG/0.5ML SC SOAJ
5.0000 mg | SUBCUTANEOUS | 1 refills | Status: DC
Start: 2024-08-12 — End: 2024-08-12

## 2024-08-12 NOTE — Addendum Note (Signed)
 Addended by: PERLA, LESLIE M. on: 08/12/2024 10:52 AM     Modules accepted: Orders

## 2024-08-12 NOTE — Progress Notes (Signed)
 Hi Patient requesting for this rx to be resent to [facility] pharmacy instead please.

## 2024-08-20 ENCOUNTER — Other Ambulatory Visit (INDEPENDENT_AMBULATORY_CARE_PROVIDER_SITE_OTHER): Payer: Self-pay | Admitting: Family Medicine

## 2024-08-20 DIAGNOSIS — E1122 Type 2 diabetes mellitus with diabetic chronic kidney disease: Secondary | ICD-10-CM

## 2024-08-22 ENCOUNTER — Ambulatory Visit (FREE_STANDING_LABORATORY_FACILITY)

## 2024-08-22 ENCOUNTER — Encounter (INDEPENDENT_AMBULATORY_CARE_PROVIDER_SITE_OTHER): Payer: Self-pay

## 2024-08-22 VITALS — BP 138/85 | HR 78 | Temp 97.7°F | Resp 16 | Ht 64.0 in | Wt 128.0 lb

## 2024-08-22 DIAGNOSIS — N3001 Acute cystitis with hematuria: Secondary | ICD-10-CM

## 2024-08-22 DIAGNOSIS — R350 Frequency of micturition: Secondary | ICD-10-CM

## 2024-08-22 LAB — MCKESSON POCT UA 120
Bilirubin UA: NEGATIVE
Ketone UA: NEGATIVE
Nitrite UA: NEGATIVE
Specific Gravity UA: 1.015
Urobilinogen UA: NEGATIVE
pH UA: 6

## 2024-08-22 MED ORDER — CEPHALEXIN 500 MG PO CAPS
500.0000 mg | ORAL_CAPSULE | Freq: Two times a day (BID) | ORAL | 0 refills | Status: AC
Start: 2024-08-22 — End: 2024-08-29

## 2024-08-22 NOTE — Addendum Note (Signed)
 Addended by: JOIE SCOTT on: 08/22/2024 07:59 PM     Modules accepted: Orders

## 2024-08-22 NOTE — Patient Instructions (Signed)
 Diagnosis of UTI made based on patient history, exam findings, and results of urine dipstick. Confirmatory urine culture sent to lab. Antibiotics prescribed today. Take antibiotics as prescribed and do not stop taking antibiotic early if symptoms resolve. May take otc pyridium  or Azo for urinary discomfort. Advised pt that if symptoms do not resolve within 48 hours, follow up with PCP or urgent care. Educated about signs/symptoms of pyelonephritis including fever, body aches, chills, nausea, hematuria - advised if these symptoms occur go directly to ER.

## 2024-08-22 NOTE — Progress Notes (Signed)
 [facility]  OFFICE NOTE         Subjective   Historian: Patient      Chief Complaint   Patient presents with    Urinary Frequency     Burning with urination and the vaginal outside are is also irritated x2 days, no back pain, no otc medication taken today        History of Present Illness  Katelyn Caldwell is a 73 year old female with diabetes who presents with symptoms of a urinary tract infection.    She experiences urinary frequency, dysuria, and nocturia every hour last night. The external genital area is itchy and uncomfortable, with vaginal itching present but no discharge. There is no abdominal pain, back pain, fever, nausea, vomiting, or hematuria. She had a UTI one to two months ago. She is on Jardiance  and Mounjaro  for diabetes, which she believes increase her susceptibility to UTIs due to glycosuria. She used an over-the-counter ointment for itching but washed it off before the visit.    History:  Medications and Allergies reviewed.   Pertinent Past Medical, Surgical, Family and Social History were reviewed.        Objective     Vitals:    08/22/24 1230   BP: 138/85   BP Site: Right arm   Patient Position: Sitting   Cuff Size: Medium   Pulse: 78   Resp: 16   Temp: 97.7 F (36.5 C)   TempSrc: Tympanic   SpO2: 100%   Weight: 58.1 kg (128 lb)   Height: 1.626 m (5' 4)      Body mass index is 21.97 kg/m.          Physical Exam  Cardiovascular:      Rate and Rhythm: Normal rate and regular rhythm.      Heart sounds: Normal heart sounds, S1 normal and S2 normal.      No systolic murmur is present.      No diastolic murmur is present.   Pulmonary:      Effort: Pulmonary effort is normal.      Breath sounds: Normal breath sounds and air entry. No decreased breath sounds, wheezing, rhonchi or rales.   Abdominal:      Tenderness: There is no abdominal tenderness. There is no right CVA tenderness or left CVA tenderness.      Physical Exam      Urgent Care Course   LABS  The following POCT  tests were ordered, reviewed and discussed with the patient/family.     Results for orders placed or performed in visit on 08/22/24 (from the past 24 hours)   [employer] POCT UA    Collection Time: 08/22/24  1:17 PM   Result Value    Color, UA Yellow    Clarity, UA Cloudy    Leukocytes UA 1+ (70 Leu/ul) (A)    Nitrite UA Negative    Urobilinogen UA Negative (0.2 mg/dl)    Protein UA Trace (15mg /dl) (A)    pH UA 6.0    Blood UA 3+ (200 Ery/ul) (A)    Specific Gravity UA 1.015    Ketone UA Negative    Bilirubin UA Negative    Glucose UA 3+ (1000 mg/dl) (A)       There were no x-rays reviewed with this patient during the visit.      Procedures   Procedures     Assessment / Plan     Differential Diagnoses including but not  limited to: Acute pyelonephritis, kidney stones, bladder cancer, irritated urethra, vaginitis, interstitial cystitis, PID, bladder spasms     Assessment & Plan  Urinary tract infection with frequency of micturition  Symptoms include frequency and dysuria. Increased susceptibility due to diabetes and medications increasing glucose excretion.  - If positive for UTI, initiate treatment.    Based on patient history and clinical exam this is likely a viral illness and no antibiotics prescribed at this time. Drink plenty of water , practice good handwashing. All patients may use nasal saline spray or sinus rinses for congestion. Warm salt water  gargles every 2-3 hours for sore throat and post nasal drip. Patient educated to avoid taking multi-symptom cold medications due to risk of over-medicating and to examine active ingredients carefully. Advised if symptoms do not improve within 48 hours or worsen please follow up with primary care physician or urgent care for further evaluation         Vaginal itching  Itchiness may relate to UTI or yeast infection due to diabetes and medication use. Patient prefers to wait before treatment.      Type 2 diabetes mellitus  Managed with Jardiance  and Mounjaro , increasing  glucose excretion and susceptibility to UTIs and yeast infections.  - Continue current diabetes management with Jardiance  and Mounjaro .       Katelyn Caldwell was seen today for urinary frequency.    Diagnoses and all orders for this visit:    Acute cystitis with hematuria  -     cephALEXin  (KEFLEX ) 500 MG capsule; Take 1 capsule (500 mg) by mouth 2 (two) times daily for 7 days    Frequency of urination  -     [employer] POCT UA; Future  -     [employer] POCT UA         The indications for early follow-up with PCP and return to UC were discussed. Patient/family received education on the working diagnosis, diagnostic uncertainties, and proposed treatment plan. Indications for emergency evaluation in the ED were reviewed. Written and verbal discharge instructions were provided and discussed and all questions from the patient/family were addressed, with no apparent barriers.      Verbal consent obtained to record this visit.

## 2024-08-24 ENCOUNTER — Ambulatory Visit (INDEPENDENT_AMBULATORY_CARE_PROVIDER_SITE_OTHER): Payer: Self-pay | Admitting: Medical

## 2024-08-24 LAB — CULTURE, URINE

## 2024-09-03 ENCOUNTER — Encounter (INDEPENDENT_AMBULATORY_CARE_PROVIDER_SITE_OTHER): Payer: Self-pay | Admitting: Family Medicine

## 2024-09-09 ENCOUNTER — Encounter (INDEPENDENT_AMBULATORY_CARE_PROVIDER_SITE_OTHER): Payer: Self-pay

## 2024-09-11 ENCOUNTER — Other Ambulatory Visit (FREE_STANDING_LABORATORY_FACILITY)

## 2024-09-11 DIAGNOSIS — E1122 Type 2 diabetes mellitus with diabetic chronic kidney disease: Secondary | ICD-10-CM

## 2024-09-11 LAB — HEMOGLOBIN A1C
Average Estimated Glucose: 128.4 mg/dL
Hemoglobin A1C: 6.1 % — ABNORMAL HIGH (ref 4.6–5.6)

## 2024-09-20 ENCOUNTER — Ambulatory Visit (FREE_STANDING_LABORATORY_FACILITY): Admitting: Nurse Practitioner

## 2024-09-20 ENCOUNTER — Encounter (INDEPENDENT_AMBULATORY_CARE_PROVIDER_SITE_OTHER): Payer: Self-pay

## 2024-09-20 VITALS — BP 129/84 | HR 87 | Temp 97.5°F | Resp 16 | Ht 64.0 in | Wt 128.0 lb

## 2024-09-20 DIAGNOSIS — R399 Unspecified symptoms and signs involving the genitourinary system: Secondary | ICD-10-CM

## 2024-09-20 LAB — MCKESSON POCT UA 120
Bilirubin UA: NEGATIVE
Blood UA: NEGATIVE
Ketone UA: NEGATIVE
Leukocytes UA: NEGATIVE
Nitrite UA: POSITIVE — AB
Specific Gravity UA: 1.015
Urobilinogen UA: NEGATIVE
pH UA: 6

## 2024-09-20 NOTE — Progress Notes (Deleted)
  [facility]  OFFICE NOTE         Subjective   Historian: {Historian:72660}  {The patient needed interpreter services to provide history (Optional):29381}    Chief Complaint   Patient presents with    Urinary Tract Infection Symptoms     Pt when urinating, frequent urination sx x 2 days      HPI  Katelyn Caldwell is a 73 y.o. female who presents for ***    History:  Medications and Allergies reviewed.   Pertinent Past Medical, Surgical, Family and Social History were reviewed.  {  Disappearing Text  Click a link below to be taken to that activity or part of the chart   Chart Review  Order Review  Review Flowsheets  Labs  Health Maintenance  Immunizations  Allergies  Medications  Problem List  History  Synopsis   :55325}        Objective     Vitals:    09/20/24 1315   BP: 129/84   Pulse: 87   Resp: 16   Temp: 97.5 F (36.4 C)   TempSrc: Tympanic   SpO2: 98%   Weight: 58.1 kg (128 lb)   Height: 1.626 m (5' 4)      Body mass index is 21.97 kg/m.          Physical Exam  Urgent Care Course   LABS  The following POCT tests were ordered, reviewed and discussed with the patient/family.     Results for orders placed or performed in visit on 09/20/24 (from the past 24 hours)   [employer] POCT UA    Collection Time: 09/20/24  1:28 PM   Result Value    Color, UA Yellow    Clarity, UA Slightly Cloudy    Leukocytes UA Negative    Nitrite UA Positive (A)    Urobilinogen UA Negative (0.2 mg/dl)    Protein UA Trace (15mg /dl) (A)    pH UA 6.0    Blood UA Negative    Specific Gravity UA 1.015    Ketone UA Negative    Bilirubin UA Negative    Glucose UA 3+ (1000 mg/dl) (A)     There were no x-rays reviewed with this patient during the visit.  {Preliminary Read Documentation (Optional):71470}    Procedures   Procedures     Assessment / Plan     Differential Diagnoses including but not limited to: ***    ***    [name] was seen today for urinary tract infection symptoms.    Diagnoses and all orders for this visit:    UTI  symptoms  -     [employer] POCT UA; Future  -     [employer] POCT UA         The indications for early follow-up with PCP and return to UC were discussed. Patient/family received education on the working diagnosis, diagnostic uncertainties, and proposed treatment plan. Indications for emergency evaluation in the ED were reviewed. Written and verbal discharge instructions were provided and discussed and all questions from the patient/family were addressed, with no apparent barriers.

## 2024-09-20 NOTE — Patient Instructions (Signed)
 Drink plenty of fluids.    Start antibiotics: Bactrim  DS, take 1 tablet twice a day for 5 to 7 days.    The urine will be sent out for culture.    Follow-up with your primary care provider or return to urgent care if not improving.

## 2024-09-20 NOTE — Progress Notes (Signed)
 [place] [facility]  OFFICE NOTE         Subjective   Historian: Patient      Chief Complaint   Patient presents with    Urinary Tract Infection Symptoms     Pt when urinating, frequent urination sx x 2 days         History of Present Illness  Katelyn Caldwell is a 73 year old female with diabetes, hypertension, and chronic kidney disease who presents with symptoms of a urinary tract infection.    She has dysuria, worse at night, and increased urinary frequency with large-volume voids. Symptoms started Wednesday night, improved during the day Thursday, then recurred last night and persist today. She denies fever, chills, nausea, vomiting, abdominal pain, or back pain.    She has recurrent UTIs, with the last in October or November. She was previously treated with Macrobid . She is allergic to Cipro , which causes severe cramps and joint pain.    She takes Jardiance  and metoprolol  for blood pressure.  She has chronic kidney disease with a recent GFR of about 47 to 48 per patient.    History:  Medications and Allergies reviewed.   Pertinent Past Medical, Surgical, Family and Social History were reviewed.        Objective     Vitals:    09/20/24 1315   BP: 129/84   Pulse: 87   Resp: 16   Temp: 97.5 F (36.4 C)   TempSrc: Tympanic   SpO2: 98%   Weight: 58.1 kg (128 lb)   Height: 1.626 m (5' 4)      Body mass index is 21.97 kg/m.          Physical Exam  Constitutional:       Appearance: Normal appearance.   HENT:      Head: Normocephalic and atraumatic.      Mouth/Throat:      Mouth: Mucous membranes are moist.     Eyes: Conjunctivae are normal. Pupils are equal, round, and reactive to light. Cardiovascular:      Rate and Rhythm: Normal rate and regular rhythm.      Heart sounds: Normal heart sounds.   Pulmonary:      Effort: Pulmonary effort is normal.      Breath sounds: Normal breath sounds.   Abdominal:      General: Abdomen is flat. Bowel sounds are normal. There is no distension.      Palpations:  Abdomen is soft. There is no mass.      Tenderness: There is no abdominal tenderness. There is no right CVA tenderness, left CVA tenderness or guarding.   Musculoskeletal:      Cervical back: Normal range of motion.   Neurological:      General: No focal deficit present.      Mental Status: She is alert and oriented to person, place, and time.   Skin:     General: Skin is warm and dry.   Vitals and nursing note reviewed.      Physical Exam  ABDOMEN: No costovertebral angle tenderness. Abdomen non-tender.  Urgent Care Course   LABS  The following POCT tests were ordered, reviewed and discussed with the patient/family.     Results for orders placed or performed in visit on 09/20/24 (from the past 24 hours)   [employer] POCT UA    Collection Time: 09/20/24  1:28 PM   Result Value    Color, UA Yellow    Clarity, UA  Slightly Cloudy    Leukocytes UA Negative    Nitrite UA Positive (A)    Urobilinogen UA Negative (0.2 mg/dl)    Protein UA Trace (15mg /dl) (A)    pH UA 6.0    Blood UA Negative    Specific Gravity UA 1.015    Ketone UA Negative    Bilirubin UA Negative    Glucose UA 3+ (1000 mg/dl) (A)     There were no x-rays reviewed with this patient during the visit.      Procedures   Procedures     Assessment / Plan     Differential Diagnoses including but not limited to: Recurrent UTI, no clinical evidence of pyelonephritis.    Assessment & Plan  A 73 year old female with a history of chronic kidney disease (GFR ~47-48) and recurrent urinary tract infections presented with dysuria and increased urinary frequency, worse at night, without fever, chills, nausea, vomiting, abdominal, or back pain. She reported that symptoms began Wednesday night, briefly resolved, and then recurred. Exam was notable for absence of costovertebral angle tenderness and no abdominal tenderness. She is currently taking Jardiance  and metoprolol , and has a documented allergy to ciprofloxacin .    Differential diagnosis includes, but is not limited  to:  - Acute uncomplicated urinary tract infection: Dysuria and increased urinary frequency in the absence of systemic symptoms, with urinalysis suggestive of infection, are consistent with an acute uncomplicated urinary tract infection.  - Pyelonephritis: Pyelonephritis was considered due to the risk of progression from lower urinary tract infection, but is less likely given the absence of fever, chills, back pain, or costovertebral angle tenderness.    Acute uncomplicated urinary tract infection in the setting of chronic kidney disease  - Prescribed Bactrim  DS one tablet twice a day for seven days  - Sent urine for culture to identify bacteria and adjust treatment if necessary  - Advised to maintain high fluid intake       Katelyn Caldwell was seen today for urinary tract infection symptoms.    Diagnoses and all orders for this visit:    UTI symptoms  -     [employer] POCT UA; Future  -     [employer] POCT UA  -     Culture, Urine; Future  -     Culture, Urine    The prescription for Bactrim  DS, take 1 tablet twice a day for 7 days, dispense 14 was handwritten and handed to the patient.         The indications for early follow-up with PCP and return to UC were discussed. Patient/family received education on the working diagnosis, diagnostic uncertainties, and proposed treatment plan. Indications for emergency evaluation in the ED were reviewed. Written and verbal discharge instructions were provided and discussed and all questions from the patient/family were addressed, with no apparent barriers.      Verbal consent obtained to record this visit.

## 2024-09-23 ENCOUNTER — Ambulatory Visit (INDEPENDENT_AMBULATORY_CARE_PROVIDER_SITE_OTHER): Payer: Self-pay | Admitting: Medical

## 2024-09-23 LAB — CULTURE, URINE: Culture Urine: >100000 — AB

## 2024-10-03 ENCOUNTER — Encounter (INDEPENDENT_AMBULATORY_CARE_PROVIDER_SITE_OTHER): Payer: Self-pay | Admitting: Family Medicine

## 2024-10-03 DIAGNOSIS — E1122 Type 2 diabetes mellitus with diabetic chronic kidney disease: Secondary | ICD-10-CM

## 2024-10-04 MED ORDER — TIRZEPATIDE 5 MG/0.5ML SC SOAJ: 5.0000 mg | SUBCUTANEOUS | 1 refills | Status: AC

## 2024-10-10 ENCOUNTER — Encounter (INDEPENDENT_AMBULATORY_CARE_PROVIDER_SITE_OTHER): Payer: Self-pay

## 2024-10-26 ENCOUNTER — Encounter (INDEPENDENT_AMBULATORY_CARE_PROVIDER_SITE_OTHER): Payer: Self-pay | Admitting: Family Medicine

## 2024-10-28 MED ORDER — ATORVASTATIN CALCIUM 80 MG PO TABS: 80.0000 mg | ORAL_TABLET | Freq: Every day | ORAL | 3 refills | Status: AC

## 2024-10-28 MED ORDER — ATORVASTATIN CALCIUM 80 MG PO TABS: 80.0000 mg | ORAL_TABLET | Freq: Every day | ORAL | 3 refills | Status: DC

## 2024-10-28 NOTE — Progress Notes (Signed)
 Rx pending to [facility] [place]

## 2024-10-28 NOTE — Addendum Note (Signed)
 Addended by: Ermon Sagan on: 10/28/2024 10:35 AM     Modules accepted: Orders

## 2024-10-28 NOTE — Progress Notes (Signed)
 LOV- 9.29.2025 (DM2)
